# Patient Record
Sex: Male | Born: 1935 | Race: White | Hispanic: No | Marital: Single | Smoking: Former smoker
Health system: Southern US, Community
[De-identification: ages and names within clinical notes are randomized; demographics above are authoritative.]

## PROBLEM LIST (undated history)

## (undated) DIAGNOSIS — F329 Major depressive disorder, single episode, unspecified: Secondary | ICD-10-CM

## (undated) DIAGNOSIS — N400 Enlarged prostate without lower urinary tract symptoms: Secondary | ICD-10-CM

## (undated) DIAGNOSIS — F32A Depression, unspecified: Secondary | ICD-10-CM

## (undated) DIAGNOSIS — J45909 Unspecified asthma, uncomplicated: Secondary | ICD-10-CM

## (undated) DIAGNOSIS — K5730 Diverticulosis of large intestine without perforation or abscess without bleeding: Secondary | ICD-10-CM

## (undated) DIAGNOSIS — K219 Gastro-esophageal reflux disease without esophagitis: Secondary | ICD-10-CM

## (undated) DIAGNOSIS — E119 Type 2 diabetes mellitus without complications: Secondary | ICD-10-CM

## (undated) DIAGNOSIS — N200 Calculus of kidney: Secondary | ICD-10-CM

## (undated) DIAGNOSIS — J189 Pneumonia, unspecified organism: Secondary | ICD-10-CM

## (undated) DIAGNOSIS — F419 Anxiety disorder, unspecified: Secondary | ICD-10-CM

## (undated) DIAGNOSIS — I10 Essential (primary) hypertension: Secondary | ICD-10-CM

## (undated) DIAGNOSIS — K449 Diaphragmatic hernia without obstruction or gangrene: Secondary | ICD-10-CM

## (undated) DIAGNOSIS — I2510 Atherosclerotic heart disease of native coronary artery without angina pectoris: Secondary | ICD-10-CM

## (undated) DIAGNOSIS — J42 Unspecified chronic bronchitis: Secondary | ICD-10-CM

## (undated) DIAGNOSIS — J310 Chronic rhinitis: Secondary | ICD-10-CM

## (undated) DIAGNOSIS — E7800 Pure hypercholesterolemia, unspecified: Secondary | ICD-10-CM

## (undated) DIAGNOSIS — I509 Heart failure, unspecified: Secondary | ICD-10-CM

## (undated) HISTORY — PX: CYSTOSCOPY W/ LITHOLAPAXY / EHL: SUR377

## (undated) HISTORY — PX: COLONOSCOPY W/ BIOPSIES AND POLYPECTOMY: SHX1376

## (undated) HISTORY — PX: TRANSURETHRAL RESECTION OF PROSTATE: SHX73

## (undated) HISTORY — PX: CORONARY ANGIOPLASTY WITH STENT PLACEMENT: SHX49

## (undated) HISTORY — PX: PROSTATE SURGERY: SHX751

---
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
## 1960-02-26 HISTORY — PX: INGUINAL HERNIA REPAIR: SUR1180

## 1978-02-25 HISTORY — PX: INGUINAL HERNIA REPAIR: SUR1180

## 1997-08-31 ENCOUNTER — Ambulatory Visit (HOSPITAL_COMMUNITY): Admission: RE | Admit: 1997-08-31 | Discharge: 1997-08-31 | Payer: Self-pay | Admitting: *Deleted

## 2002-09-03 ENCOUNTER — Encounter (INDEPENDENT_AMBULATORY_CARE_PROVIDER_SITE_OTHER): Payer: Self-pay | Admitting: Specialist

## 2002-09-03 ENCOUNTER — Ambulatory Visit (HOSPITAL_COMMUNITY): Admission: RE | Admit: 2002-09-03 | Discharge: 2002-09-03 | Payer: Self-pay | Admitting: *Deleted

## 2004-01-03 ENCOUNTER — Ambulatory Visit: Payer: Self-pay | Admitting: Internal Medicine

## 2004-03-01 ENCOUNTER — Ambulatory Visit: Payer: Self-pay | Admitting: Internal Medicine

## 2004-03-05 ENCOUNTER — Ambulatory Visit: Payer: Self-pay | Admitting: Internal Medicine

## 2004-03-12 ENCOUNTER — Ambulatory Visit: Payer: Self-pay | Admitting: Internal Medicine

## 2004-03-15 ENCOUNTER — Emergency Department (HOSPITAL_COMMUNITY): Admission: EM | Admit: 2004-03-15 | Discharge: 2004-03-15 | Payer: Self-pay | Admitting: Emergency Medicine

## 2004-03-26 ENCOUNTER — Ambulatory Visit: Payer: Self-pay | Admitting: Internal Medicine

## 2004-04-03 ENCOUNTER — Ambulatory Visit: Payer: Self-pay | Admitting: Internal Medicine

## 2004-04-09 ENCOUNTER — Ambulatory Visit: Payer: Self-pay | Admitting: Internal Medicine

## 2004-04-16 ENCOUNTER — Ambulatory Visit: Payer: Self-pay | Admitting: Internal Medicine

## 2004-05-01 ENCOUNTER — Ambulatory Visit: Payer: Self-pay | Admitting: Internal Medicine

## 2004-05-08 ENCOUNTER — Ambulatory Visit: Payer: Self-pay | Admitting: Internal Medicine

## 2004-05-15 ENCOUNTER — Ambulatory Visit: Payer: Self-pay | Admitting: Internal Medicine

## 2004-06-04 ENCOUNTER — Ambulatory Visit: Payer: Self-pay | Admitting: Internal Medicine

## 2004-06-12 ENCOUNTER — Ambulatory Visit: Payer: Self-pay | Admitting: Internal Medicine

## 2004-06-18 ENCOUNTER — Ambulatory Visit: Payer: Self-pay | Admitting: Internal Medicine

## 2004-06-19 ENCOUNTER — Ambulatory Visit: Payer: Self-pay | Admitting: Internal Medicine

## 2004-06-25 ENCOUNTER — Ambulatory Visit: Payer: Self-pay | Admitting: Internal Medicine

## 2004-06-28 ENCOUNTER — Ambulatory Visit: Payer: Self-pay | Admitting: Internal Medicine

## 2004-07-03 ENCOUNTER — Ambulatory Visit: Payer: Self-pay | Admitting: Internal Medicine

## 2004-07-16 ENCOUNTER — Ambulatory Visit: Payer: Self-pay | Admitting: Internal Medicine

## 2004-07-17 ENCOUNTER — Ambulatory Visit: Payer: Self-pay | Admitting: Internal Medicine

## 2004-07-30 ENCOUNTER — Ambulatory Visit: Payer: Self-pay | Admitting: Internal Medicine

## 2004-08-07 ENCOUNTER — Ambulatory Visit: Payer: Self-pay | Admitting: Internal Medicine

## 2004-08-20 ENCOUNTER — Ambulatory Visit: Payer: Self-pay | Admitting: Internal Medicine

## 2004-08-29 ENCOUNTER — Ambulatory Visit: Payer: Self-pay | Admitting: Internal Medicine

## 2004-09-03 ENCOUNTER — Ambulatory Visit: Payer: Self-pay | Admitting: Internal Medicine

## 2004-09-04 ENCOUNTER — Ambulatory Visit: Payer: Self-pay | Admitting: Internal Medicine

## 2004-09-11 ENCOUNTER — Ambulatory Visit: Payer: Self-pay | Admitting: Internal Medicine

## 2004-09-17 ENCOUNTER — Ambulatory Visit: Payer: Self-pay | Admitting: Internal Medicine

## 2004-09-20 ENCOUNTER — Ambulatory Visit: Payer: Self-pay | Admitting: Internal Medicine

## 2004-09-24 ENCOUNTER — Ambulatory Visit: Payer: Self-pay | Admitting: Internal Medicine

## 2004-10-15 ENCOUNTER — Ambulatory Visit: Payer: Self-pay | Admitting: Internal Medicine

## 2004-10-17 ENCOUNTER — Ambulatory Visit (HOSPITAL_COMMUNITY): Admission: RE | Admit: 2004-10-17 | Discharge: 2004-10-17 | Payer: Self-pay | Admitting: *Deleted

## 2004-10-30 ENCOUNTER — Ambulatory Visit: Payer: Self-pay | Admitting: Internal Medicine

## 2004-11-06 ENCOUNTER — Ambulatory Visit: Payer: Self-pay | Admitting: Internal Medicine

## 2004-11-27 ENCOUNTER — Ambulatory Visit: Payer: Self-pay | Admitting: Internal Medicine

## 2004-12-03 ENCOUNTER — Ambulatory Visit: Payer: Self-pay | Admitting: Internal Medicine

## 2004-12-10 ENCOUNTER — Ambulatory Visit: Payer: Self-pay | Admitting: Internal Medicine

## 2004-12-12 ENCOUNTER — Ambulatory Visit: Payer: Self-pay | Admitting: Internal Medicine

## 2004-12-17 ENCOUNTER — Ambulatory Visit: Payer: Self-pay | Admitting: Internal Medicine

## 2004-12-25 ENCOUNTER — Ambulatory Visit: Payer: Self-pay | Admitting: Internal Medicine

## 2004-12-31 ENCOUNTER — Ambulatory Visit: Payer: Self-pay | Admitting: Internal Medicine

## 2005-01-08 ENCOUNTER — Ambulatory Visit: Payer: Self-pay | Admitting: Internal Medicine

## 2005-01-15 ENCOUNTER — Ambulatory Visit: Payer: Self-pay | Admitting: Internal Medicine

## 2005-01-22 ENCOUNTER — Ambulatory Visit: Payer: Self-pay | Admitting: Internal Medicine

## 2005-01-28 ENCOUNTER — Ambulatory Visit: Payer: Self-pay | Admitting: Internal Medicine

## 2005-02-05 ENCOUNTER — Ambulatory Visit: Payer: Self-pay | Admitting: Internal Medicine

## 2005-02-11 ENCOUNTER — Ambulatory Visit: Payer: Self-pay | Admitting: Internal Medicine

## 2005-02-27 ENCOUNTER — Ambulatory Visit: Payer: Self-pay | Admitting: Internal Medicine

## 2005-02-28 ENCOUNTER — Ambulatory Visit: Payer: Self-pay | Admitting: Internal Medicine

## 2005-03-26 ENCOUNTER — Ambulatory Visit: Payer: Self-pay | Admitting: Internal Medicine

## 2005-04-02 ENCOUNTER — Ambulatory Visit: Payer: Self-pay | Admitting: Internal Medicine

## 2005-04-09 ENCOUNTER — Ambulatory Visit: Payer: Self-pay | Admitting: Internal Medicine

## 2005-04-19 ENCOUNTER — Emergency Department (HOSPITAL_COMMUNITY): Admission: EM | Admit: 2005-04-19 | Discharge: 2005-04-19 | Payer: Self-pay | Admitting: Emergency Medicine

## 2005-04-22 ENCOUNTER — Ambulatory Visit: Payer: Self-pay

## 2006-01-11 ENCOUNTER — Emergency Department (HOSPITAL_COMMUNITY): Admission: EM | Admit: 2006-01-11 | Discharge: 2006-01-11 | Payer: Self-pay | Admitting: Emergency Medicine

## 2006-01-31 ENCOUNTER — Encounter: Admission: RE | Admit: 2006-01-31 | Discharge: 2006-01-31 | Payer: Self-pay | Admitting: *Deleted

## 2006-02-11 ENCOUNTER — Encounter: Admission: RE | Admit: 2006-02-11 | Discharge: 2006-02-11 | Payer: Self-pay | Admitting: *Deleted

## 2006-03-25 ENCOUNTER — Ambulatory Visit: Payer: Self-pay | Admitting: Internal Medicine

## 2006-03-26 ENCOUNTER — Ambulatory Visit: Payer: Self-pay | Admitting: Internal Medicine

## 2006-03-27 ENCOUNTER — Ambulatory Visit: Payer: Self-pay | Admitting: Internal Medicine

## 2006-04-01 ENCOUNTER — Ambulatory Visit: Payer: Self-pay | Admitting: Internal Medicine

## 2006-04-02 ENCOUNTER — Ambulatory Visit: Payer: Self-pay | Admitting: Internal Medicine

## 2006-04-04 ENCOUNTER — Ambulatory Visit: Payer: Self-pay | Admitting: Internal Medicine

## 2006-04-10 ENCOUNTER — Ambulatory Visit: Payer: Self-pay | Admitting: Internal Medicine

## 2006-04-14 ENCOUNTER — Ambulatory Visit: Payer: Self-pay | Admitting: Internal Medicine

## 2006-04-17 ENCOUNTER — Ambulatory Visit: Payer: Self-pay | Admitting: Internal Medicine

## 2006-04-22 ENCOUNTER — Ambulatory Visit: Payer: Self-pay | Admitting: Internal Medicine

## 2006-04-23 ENCOUNTER — Ambulatory Visit: Payer: Self-pay | Admitting: Internal Medicine

## 2006-04-25 ENCOUNTER — Ambulatory Visit: Payer: Self-pay | Admitting: Internal Medicine

## 2006-04-25 LAB — CONVERTED CEMR LAB
Cholesterol: 230 mg/dL (ref 0–200)
GFR calc Af Amer: 121 mL/min
GFR calc non Af Amer: 100 mL/min
Glucose, Bld: 107 mg/dL — ABNORMAL HIGH (ref 70–99)
HDL: 25.5 mg/dL — ABNORMAL LOW (ref >39.0)
Sodium: 136 meq/L (ref 135–145)
Total CHOL/HDL Ratio: 9
Triglycerides: 128 mg/dL (ref 0–149)

## 2006-04-29 ENCOUNTER — Ambulatory Visit: Payer: Self-pay | Admitting: Internal Medicine

## 2006-05-02 ENCOUNTER — Ambulatory Visit: Payer: Self-pay | Admitting: Internal Medicine

## 2006-05-06 ENCOUNTER — Ambulatory Visit: Payer: Self-pay | Admitting: Internal Medicine

## 2006-05-09 ENCOUNTER — Ambulatory Visit: Payer: Self-pay | Admitting: Internal Medicine

## 2006-05-12 ENCOUNTER — Ambulatory Visit: Payer: Self-pay | Admitting: Internal Medicine

## 2006-05-15 ENCOUNTER — Ambulatory Visit: Payer: Self-pay | Admitting: Internal Medicine

## 2006-05-20 ENCOUNTER — Ambulatory Visit: Payer: Self-pay | Admitting: Internal Medicine

## 2006-05-21 ENCOUNTER — Ambulatory Visit: Payer: Self-pay | Admitting: Internal Medicine

## 2006-05-23 ENCOUNTER — Ambulatory Visit: Payer: Self-pay | Admitting: Internal Medicine

## 2006-05-26 ENCOUNTER — Ambulatory Visit: Payer: Self-pay | Admitting: Internal Medicine

## 2006-05-27 ENCOUNTER — Ambulatory Visit: Payer: Self-pay | Admitting: Internal Medicine

## 2006-05-29 ENCOUNTER — Ambulatory Visit: Payer: Self-pay | Admitting: Internal Medicine

## 2006-05-29 LAB — CONVERTED CEMR LAB
ALT: 41 units/L — ABNORMAL HIGH (ref 0–40)
Triglycerides: 142 mg/dL (ref 0–149)
VLDL: 28 mg/dL (ref 0–40)

## 2006-06-02 ENCOUNTER — Ambulatory Visit: Payer: Self-pay | Admitting: Internal Medicine

## 2006-06-03 ENCOUNTER — Ambulatory Visit: Payer: Self-pay | Admitting: Internal Medicine

## 2006-06-06 ENCOUNTER — Ambulatory Visit: Payer: Self-pay | Admitting: Internal Medicine

## 2006-06-09 ENCOUNTER — Ambulatory Visit: Payer: Self-pay | Admitting: Internal Medicine

## 2006-06-10 ENCOUNTER — Ambulatory Visit: Payer: Self-pay | Admitting: Internal Medicine

## 2006-06-13 ENCOUNTER — Ambulatory Visit: Payer: Self-pay | Admitting: Internal Medicine

## 2006-06-17 ENCOUNTER — Ambulatory Visit: Payer: Self-pay | Admitting: Internal Medicine

## 2006-06-20 ENCOUNTER — Ambulatory Visit: Payer: Self-pay | Admitting: Internal Medicine

## 2006-06-26 ENCOUNTER — Ambulatory Visit: Payer: Self-pay | Admitting: Internal Medicine

## 2006-06-27 ENCOUNTER — Ambulatory Visit: Payer: Self-pay | Admitting: Internal Medicine

## 2006-06-30 ENCOUNTER — Ambulatory Visit: Payer: Self-pay | Admitting: Internal Medicine

## 2006-07-03 ENCOUNTER — Ambulatory Visit: Payer: Self-pay | Admitting: Internal Medicine

## 2006-07-07 ENCOUNTER — Ambulatory Visit: Payer: Self-pay | Admitting: Internal Medicine

## 2006-07-11 ENCOUNTER — Ambulatory Visit: Payer: Self-pay | Admitting: Internal Medicine

## 2006-07-15 ENCOUNTER — Ambulatory Visit: Payer: Self-pay | Admitting: Internal Medicine

## 2006-07-22 ENCOUNTER — Ambulatory Visit: Payer: Self-pay | Admitting: Internal Medicine

## 2006-07-29 ENCOUNTER — Ambulatory Visit: Payer: Self-pay | Admitting: Internal Medicine

## 2006-08-05 ENCOUNTER — Ambulatory Visit: Payer: Self-pay | Admitting: Internal Medicine

## 2006-08-13 ENCOUNTER — Ambulatory Visit: Payer: Self-pay | Admitting: Internal Medicine

## 2006-08-20 ENCOUNTER — Ambulatory Visit: Payer: Self-pay | Admitting: Internal Medicine

## 2006-08-27 ENCOUNTER — Ambulatory Visit: Payer: Self-pay | Admitting: Internal Medicine

## 2006-09-02 ENCOUNTER — Ambulatory Visit: Payer: Self-pay | Admitting: Internal Medicine

## 2006-09-09 ENCOUNTER — Ambulatory Visit: Payer: Self-pay | Admitting: Internal Medicine

## 2006-09-16 ENCOUNTER — Ambulatory Visit: Payer: Self-pay | Admitting: Internal Medicine

## 2006-09-17 ENCOUNTER — Ambulatory Visit: Payer: Self-pay | Admitting: Internal Medicine

## 2006-09-19 ENCOUNTER — Ambulatory Visit: Payer: Self-pay | Admitting: Cardiology

## 2006-09-24 ENCOUNTER — Ambulatory Visit: Payer: Self-pay | Admitting: Internal Medicine

## 2006-09-30 ENCOUNTER — Ambulatory Visit: Payer: Self-pay | Admitting: Internal Medicine

## 2006-10-06 ENCOUNTER — Ambulatory Visit: Payer: Self-pay | Admitting: Internal Medicine

## 2006-10-08 ENCOUNTER — Ambulatory Visit: Payer: Self-pay | Admitting: Internal Medicine

## 2006-10-10 ENCOUNTER — Ambulatory Visit: Payer: Self-pay | Admitting: Internal Medicine

## 2006-10-13 ENCOUNTER — Ambulatory Visit: Payer: Self-pay | Admitting: Internal Medicine

## 2006-10-13 LAB — CONVERTED CEMR LAB
ALT: 51 units/L (ref 0–53)
AST: 34 units/L (ref 0–37)
Albumin: 4.6 g/dL (ref 3.5–5.2)
Alkaline Phosphatase: 56 units/L (ref 39–117)
BUN: 9 mg/dL (ref 6–23)
Bacteria, UA: NEGATIVE
Basophils Absolute: 0.1 10*3/uL (ref 0.0–0.1)
Calcium: 9.7 mg/dL (ref 8.4–10.5)
Chloride: 104 meq/L (ref 96–112)
GFR calc Af Amer: 106 mL/min
GFR calc non Af Amer: 87 mL/min
HCT: 47.2 % (ref 39.0–52.0)
Hemoglobin, Urine: NEGATIVE
MCHC: 34.6 g/dL (ref 30.0–36.0)
Neutrophils Relative %: 65.4 % (ref 43.0–77.0)
RBC: 5.34 M/uL (ref 4.22–5.81)
RDW: 13.5 % (ref 11.5–14.6)
Specific Gravity, Urine: 1.02 (ref 1.000–1.03)
Squamous Epithelial / LPF: NEGATIVE /lpf
Total Protein, Urine: NEGATIVE mg/dL
WBC: 6.1 10*3/uL (ref 4.5–10.5)
pH: 5.5 (ref 5.0–8.0)

## 2006-10-14 ENCOUNTER — Ambulatory Visit: Payer: Self-pay | Admitting: Internal Medicine

## 2006-10-14 LAB — CONVERTED CEMR LAB
Cholesterol: 174 mg/dL (ref 0–200)
HDL: 19.4 mg/dL — ABNORMAL LOW (ref >39.0)
LDL Cholesterol: 135 mg/dL — ABNORMAL HIGH (ref 0–99)
Triglycerides: 98 mg/dL (ref 0–149)
VLDL: 20 mg/dL (ref 0–40)

## 2006-10-16 ENCOUNTER — Ambulatory Visit: Payer: Self-pay | Admitting: Internal Medicine

## 2006-10-20 ENCOUNTER — Ambulatory Visit: Payer: Self-pay | Admitting: Internal Medicine

## 2006-10-22 ENCOUNTER — Ambulatory Visit: Payer: Self-pay | Admitting: Internal Medicine

## 2006-10-28 ENCOUNTER — Ambulatory Visit: Payer: Self-pay | Admitting: Internal Medicine

## 2006-10-29 ENCOUNTER — Ambulatory Visit: Payer: Self-pay | Admitting: Internal Medicine

## 2006-11-03 ENCOUNTER — Ambulatory Visit: Payer: Self-pay | Admitting: Internal Medicine

## 2006-11-05 ENCOUNTER — Ambulatory Visit: Payer: Self-pay | Admitting: Internal Medicine

## 2006-11-09 ENCOUNTER — Emergency Department (HOSPITAL_COMMUNITY): Admission: EM | Admit: 2006-11-09 | Discharge: 2006-11-09 | Payer: Self-pay | Admitting: Emergency Medicine

## 2006-11-12 ENCOUNTER — Ambulatory Visit: Payer: Self-pay | Admitting: Internal Medicine

## 2006-11-24 ENCOUNTER — Encounter: Payer: Self-pay | Admitting: *Deleted

## 2006-11-24 DIAGNOSIS — K219 Gastro-esophageal reflux disease without esophagitis: Secondary | ICD-10-CM

## 2006-11-24 DIAGNOSIS — K5730 Diverticulosis of large intestine without perforation or abscess without bleeding: Secondary | ICD-10-CM | POA: Insufficient documentation

## 2006-11-24 DIAGNOSIS — D126 Benign neoplasm of colon, unspecified: Secondary | ICD-10-CM | POA: Insufficient documentation

## 2006-11-24 DIAGNOSIS — E119 Type 2 diabetes mellitus without complications: Secondary | ICD-10-CM | POA: Insufficient documentation

## 2006-11-24 DIAGNOSIS — N200 Calculus of kidney: Secondary | ICD-10-CM | POA: Insufficient documentation

## 2006-11-24 DIAGNOSIS — Z87898 Personal history of other specified conditions: Secondary | ICD-10-CM | POA: Insufficient documentation

## 2006-11-24 DIAGNOSIS — I10 Essential (primary) hypertension: Secondary | ICD-10-CM | POA: Insufficient documentation

## 2006-11-24 DIAGNOSIS — E785 Hyperlipidemia, unspecified: Secondary | ICD-10-CM | POA: Insufficient documentation

## 2006-11-24 HISTORY — DX: Gastro-esophageal reflux disease without esophagitis: K21.9

## 2006-12-01 ENCOUNTER — Ambulatory Visit: Payer: Self-pay | Admitting: Internal Medicine

## 2006-12-09 ENCOUNTER — Ambulatory Visit (HOSPITAL_COMMUNITY): Admission: RE | Admit: 2006-12-09 | Discharge: 2006-12-09 | Payer: Self-pay | Admitting: *Deleted

## 2006-12-09 DIAGNOSIS — J3089 Other allergic rhinitis: Secondary | ICD-10-CM | POA: Insufficient documentation

## 2006-12-09 DIAGNOSIS — J45909 Unspecified asthma, uncomplicated: Secondary | ICD-10-CM | POA: Insufficient documentation

## 2006-12-10 ENCOUNTER — Ambulatory Visit: Payer: Self-pay | Admitting: Internal Medicine

## 2006-12-10 ENCOUNTER — Encounter: Payer: Self-pay | Admitting: Internal Medicine

## 2006-12-10 DIAGNOSIS — E7439 Other disorders of intestinal carbohydrate absorption: Secondary | ICD-10-CM | POA: Insufficient documentation

## 2006-12-10 DIAGNOSIS — Z8639 Personal history of other endocrine, nutritional and metabolic disease: Secondary | ICD-10-CM

## 2006-12-10 DIAGNOSIS — H9319 Tinnitus, unspecified ear: Secondary | ICD-10-CM | POA: Insufficient documentation

## 2006-12-10 DIAGNOSIS — N400 Enlarged prostate without lower urinary tract symptoms: Secondary | ICD-10-CM | POA: Insufficient documentation

## 2006-12-10 DIAGNOSIS — R634 Abnormal weight loss: Secondary | ICD-10-CM | POA: Insufficient documentation

## 2006-12-10 DIAGNOSIS — F411 Generalized anxiety disorder: Secondary | ICD-10-CM | POA: Insufficient documentation

## 2006-12-10 DIAGNOSIS — Z862 Personal history of diseases of the blood and blood-forming organs and certain disorders involving the immune mechanism: Secondary | ICD-10-CM | POA: Insufficient documentation

## 2006-12-10 DIAGNOSIS — Z87442 Personal history of urinary calculi: Secondary | ICD-10-CM | POA: Insufficient documentation

## 2006-12-10 DIAGNOSIS — J309 Allergic rhinitis, unspecified: Secondary | ICD-10-CM | POA: Insufficient documentation

## 2006-12-10 DIAGNOSIS — F329 Major depressive disorder, single episode, unspecified: Secondary | ICD-10-CM

## 2006-12-10 DIAGNOSIS — F3289 Other specified depressive episodes: Secondary | ICD-10-CM | POA: Insufficient documentation

## 2006-12-12 ENCOUNTER — Telehealth (INDEPENDENT_AMBULATORY_CARE_PROVIDER_SITE_OTHER): Payer: Self-pay | Admitting: *Deleted

## 2006-12-16 ENCOUNTER — Ambulatory Visit: Payer: Self-pay | Admitting: Internal Medicine

## 2006-12-19 ENCOUNTER — Encounter: Payer: Self-pay | Admitting: Internal Medicine

## 2006-12-19 ENCOUNTER — Ambulatory Visit: Payer: Self-pay | Admitting: Internal Medicine

## 2006-12-19 DIAGNOSIS — F519 Sleep disorder not due to a substance or known physiological condition, unspecified: Secondary | ICD-10-CM | POA: Insufficient documentation

## 2006-12-23 ENCOUNTER — Encounter: Payer: Self-pay | Admitting: Internal Medicine

## 2006-12-23 LAB — CONVERTED CEMR LAB
BUN: 4 mg/dL — ABNORMAL LOW (ref 6–23)
Creatinine, Ser: 0.8 mg/dL (ref 0.4–1.5)

## 2006-12-24 ENCOUNTER — Ambulatory Visit: Payer: Self-pay | Admitting: Internal Medicine

## 2006-12-25 ENCOUNTER — Emergency Department (HOSPITAL_COMMUNITY): Admission: EM | Admit: 2006-12-25 | Discharge: 2006-12-25 | Payer: Self-pay | Admitting: Emergency Medicine

## 2006-12-27 ENCOUNTER — Emergency Department (HOSPITAL_COMMUNITY): Admission: EM | Admit: 2006-12-27 | Discharge: 2006-12-27 | Payer: Self-pay | Admitting: Emergency Medicine

## 2007-01-02 ENCOUNTER — Emergency Department (HOSPITAL_COMMUNITY): Admission: EM | Admit: 2007-01-02 | Discharge: 2007-01-02 | Payer: Self-pay | Admitting: Emergency Medicine

## 2007-01-06 ENCOUNTER — Ambulatory Visit: Payer: Self-pay | Admitting: Internal Medicine

## 2007-01-08 ENCOUNTER — Ambulatory Visit: Payer: Self-pay | Admitting: Internal Medicine

## 2007-01-08 DIAGNOSIS — J209 Acute bronchitis, unspecified: Secondary | ICD-10-CM | POA: Insufficient documentation

## 2007-01-08 DIAGNOSIS — H1010 Acute atopic conjunctivitis, unspecified eye: Secondary | ICD-10-CM | POA: Insufficient documentation

## 2007-01-12 ENCOUNTER — Ambulatory Visit: Payer: Self-pay | Admitting: Internal Medicine

## 2007-01-12 ENCOUNTER — Telehealth (INDEPENDENT_AMBULATORY_CARE_PROVIDER_SITE_OTHER): Payer: Self-pay | Admitting: *Deleted

## 2007-01-14 ENCOUNTER — Ambulatory Visit: Payer: Self-pay | Admitting: Internal Medicine

## 2007-01-19 ENCOUNTER — Ambulatory Visit: Payer: Self-pay | Admitting: Internal Medicine

## 2007-01-26 ENCOUNTER — Ambulatory Visit: Payer: Self-pay | Admitting: Internal Medicine

## 2007-02-04 ENCOUNTER — Ambulatory Visit: Payer: Self-pay | Admitting: Internal Medicine

## 2007-03-06 ENCOUNTER — Ambulatory Visit: Payer: Self-pay | Admitting: Internal Medicine

## 2007-03-09 ENCOUNTER — Inpatient Hospital Stay (HOSPITAL_COMMUNITY): Admission: RE | Admit: 2007-03-09 | Discharge: 2007-03-10 | Payer: Self-pay | Admitting: Psychiatry

## 2007-03-09 ENCOUNTER — Emergency Department (HOSPITAL_COMMUNITY): Admission: EM | Admit: 2007-03-09 | Discharge: 2007-03-09 | Payer: Self-pay | Admitting: Emergency Medicine

## 2007-03-09 ENCOUNTER — Ambulatory Visit: Payer: Self-pay | Admitting: Psychiatry

## 2007-03-17 ENCOUNTER — Encounter: Payer: Self-pay | Admitting: Internal Medicine

## 2007-03-19 ENCOUNTER — Encounter: Admission: RE | Admit: 2007-03-19 | Discharge: 2007-03-19 | Payer: Self-pay | Admitting: *Deleted

## 2007-03-20 ENCOUNTER — Ambulatory Visit: Payer: Self-pay | Admitting: Internal Medicine

## 2007-03-27 ENCOUNTER — Ambulatory Visit (HOSPITAL_COMMUNITY): Admission: RE | Admit: 2007-03-27 | Discharge: 2007-03-27 | Payer: Self-pay | Admitting: *Deleted

## 2007-04-21 ENCOUNTER — Encounter: Payer: Self-pay | Admitting: Internal Medicine

## 2007-05-14 ENCOUNTER — Ambulatory Visit: Payer: Self-pay | Admitting: Internal Medicine

## 2007-06-11 ENCOUNTER — Ambulatory Visit: Payer: Self-pay | Admitting: Internal Medicine

## 2007-06-18 ENCOUNTER — Ambulatory Visit: Payer: Self-pay | Admitting: Internal Medicine

## 2007-06-23 ENCOUNTER — Ambulatory Visit: Payer: Self-pay | Admitting: Internal Medicine

## 2007-06-25 ENCOUNTER — Ambulatory Visit: Payer: Self-pay | Admitting: Internal Medicine

## 2007-07-02 ENCOUNTER — Ambulatory Visit: Payer: Self-pay | Admitting: Internal Medicine

## 2007-07-09 ENCOUNTER — Ambulatory Visit: Payer: Self-pay | Admitting: Internal Medicine

## 2007-07-17 ENCOUNTER — Ambulatory Visit: Payer: Self-pay | Admitting: Internal Medicine

## 2007-07-23 ENCOUNTER — Ambulatory Visit: Payer: Self-pay | Admitting: Internal Medicine

## 2007-07-30 ENCOUNTER — Ambulatory Visit: Payer: Self-pay | Admitting: Internal Medicine

## 2007-08-20 ENCOUNTER — Ambulatory Visit: Payer: Self-pay | Admitting: Internal Medicine

## 2007-09-03 ENCOUNTER — Ambulatory Visit: Payer: Self-pay | Admitting: Internal Medicine

## 2007-10-01 ENCOUNTER — Ambulatory Visit: Payer: Self-pay | Admitting: Internal Medicine

## 2010-03-18 ENCOUNTER — Encounter: Payer: Self-pay | Admitting: Internal Medicine

## 2010-03-27 NOTE — Miscellaneous (Signed)
Summary: Injection Record / [facility]    Injection Record / [facility]    Imported By: Lennie Odor 07/18/2009 16:00:49  _____________________________________________________________________  External Attachment:    Type:   Image     Comment:   External Document

## 2010-07-10 NOTE — Assessment & Plan Note (Signed)
[name]                             PULMONARY OFFICE NOTE   NAME:[name], [name]                       MRN:          [id number]  DATE:09/17/2006                            DOB:          [birth year]    PROBLEM:  1. Asthma.  2. Allergic rhinitis.  3. Esophageal reflux.   PRIMARY PHYSICIAN:  Rosalyn Gess. Norins, MD   HISTORY:  Bothered by tinnitus, which did not respond to a trial Valtrex  and prednisone.  He saw Dr. Suzanna Obey and is pending a CT for  evaluation.  Diltiazem was added for blood pressure.  He quite Nasacort,  not seeing that it helped, but he was using it only very intermittently,  and was discussed this.  He continues allergy vaccine here with no  problems.  Astelin was stopped, because of over-drying.  He has not been  wheezing.  There has been no sputum, no chest pain.   MEDICATION LIST:  Reviewed without changes.  He tends to be intermittent  with all of his maintenance therapies including Advair, Astelin, and  Nasacort.  He continues Altace but denies bothersome cough or urticaria.   OBJECTIVE:  VITAL SIGNS:  Weight 214 pounds, BP 122/68, pulse 79, room  air saturation 96%.  HEENT:  There is mild nasal congestion, dry turbinate edema bilaterally.  No visible polyps, no postnasal drainage.  He has a long palate.  He is  alone and not being told that he snores or stops breathing.  He does not  seem to notice significant day-time sleepiness.  CHEST:  Clear.  Breathing is unlabored.  Speech quality is unremarkable.  There is no stridor.  HEART:  Sounds are regular without murmurs.  EXTREMITIES:  Without edema.   IMPRESSION:  Asthma control seems adequate.  Allergic rhinitis probably  includes a component of irritant rhinitis.  He does not seem bothered  enough to be willing to give a sustained trial to any nasal spray, and  we discussed options.  Esophageal reflux has not recently been a major  complaint.  Tinnitus is being  evaluated by Dr. Jearld Fenton.   PLAN:  Sample Veramyst.  Encouraging him to use the sample up, two  sprays each night at bedtime, paying attention to whether it helps by  the time he has finished it.  He will continue his allergy vaccine and  schedule return in 4 months, earlier p.r.n.     [name] D. Maple Hudson, MD, [name] [name], FACP  Electronically Signed   CDY/MedQ  DD: 09/20/2006  DT: 09/21/2006  Job #: [id number]

## 2010-07-10 NOTE — Op Note (Signed)
NAMEALGERNON, MUNDIE NO.:  [id number]   MEDICAL RECORD NO.:  [id number]          PATIENT TYPE:  AMB   LOCATION:  ENDO                         FACILITY:  [facility]   PHYSICIAN:  Georgiana Spinner, M.D.    DATE OF BIRTH:  [birth year]   DATE OF PROCEDURE:  DATE OF DISCHARGE:                               OPERATIVE REPORT   PROCEDURE:  Colonoscopy.   INDICATIONS:  Weight loss.   ANESTHESIA:  Fentanyl 60 mcg, Versed 6 mg.   PROCEDURE:  With the patient mildly sedated in the left lateral  decubitus position, a rectal exam was performed which was unremarkable.  Subsequently the Pentax videoscopic colonoscope was inserted in the  rectum and passed under direct vision to the cecum identified by the  ileocecal valve and appendiceal orifice at the base of the cecum, both  of which were photographed. From this point, the colonoscope was slowly  withdrawn taking circumferential views of the colonic mucosa stopping  only to photograph diverticula seen along the way until we reached the  rectum which appeared normal on direct and showed hemorrhoids on  retroflexed view. The endoscope was straightened and withdrawn.  The  patient's vital signs and pulse oximeter remained stable.  The patient  tolerated the procedure well without apparent complications.   FINDINGS:  Internal hemorrhoids, moderate diverticulosis of sigmoid  colon otherwise an unremarkable exam.   PLAN:  Consider repeat examination in 5 or 10 years           ______________________________  Georgiana Spinner, M.D.     GMO/MEDQ  D:  03/27/2007  T:  03/27/2007  Job:  [id number]

## 2010-07-10 NOTE — Discharge Summary (Signed)
NAMEDODD, SCHMID NO.:  [id number]   MEDICAL RECORD NO.:  [id number]          PATIENT TYPE:  IPS   LOCATION:  0504                          [facility]ACILITY:  [facility]   PHYSICIAN:  Jasmine Pang, M.D. DATE OF BIRTH:  1928-07-24   DATE OF ADMISSION:  03/09/2007  DATE OF DISCHARGE:  03/10/2007                               DISCHARGE SUMMARY   IDENTIFYING INFORMATION:  This is a 77 year old single white male.  This  is a voluntary admission.   HISTORY OF PRESENT ILLNESS:  This patient was on the [facility]al [facility]  Unit for a brief observational stay after he was referred by his  urologist.  Apparently, he presented at the [facility] for a routine  check and, at that time, expressed to them that he was having a lot of  problems with depressed mood.  Indicated that he had had some thoughts  of ending his life, possibly by sitting in a parked car with the engine  running.  His physician became concerned and referred him for admission.  The patient today admits that he has had some fleeting thoughts about  suicide.  Denies that he has true suicidal intent.  He denies any  history of prior attempts.  He reports that he has been increasingly  depressed since May of this year because of increasing and varied  physical problems which included some tinnitus that he developed in May  2008 followed by some skin infections and then some prostate trouble.  He had also been socially quite active with a friend of his that he  would go out with several times a week.  His friend became ill at the  beginning of December and he has had little social contact since that  time.  He did spend Christmas with a nephew at his home here in  [place].  The patient is denying that he has any suicidal intent  today.  Has been referred to Dr. Wynonia Lawman by his primary care physician  and would like to pursue outpatient treatment.   PAST PSYCHIATRIC HISTORY:  This is the patient's first inpatient  psychiatric admission.  He denies any history of learning disability or  abuse in childhood.  Denies any substance abuse.  He has taken Celexa in  the past for depression, which has caused him to have quite a bit of  sedation.  He has also taken amitriptyline at one point in the past  which is to help with sleep, which he is not currently taking.  Denies  any history of prior suicide attempts.  No history of substance abuse.  Currently has an appointment next week with Dr. Wynonia Lawman on January 26th  at 10:00 a.m.   SOCIAL HISTORY:  A single white male, never married, no children.  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  He retired  from there in 2001.  He has two sisters which are deceased, one sister  in 41 and the other sister and 2006.  He also has one sister living in  [place] with whom he maintains communication.  Graduated  from college at  [employer] with a baccalaureate degree in business.  Mother  deceased from a stroke.  Currently living alone and does maintain  contact with his nephew who lives here in town.   FAMILY HISTORY:  He denies any family history of depression or substance  abuse.   MEDICAL HISTORY:  The patient is followed by Dr. Illene Regulus, his  primary care physician.   MEDICAL PROBLEMS:  Are hypertension and cholesterol.  He has a history  of tinnitus and complains of some residual buzzing in his ear but he  feels he can cope well with this.   CURRENT MEDICATIONS:  1. Diovan 160 daily for his hypertension.  2. also takes Claritin 10 mg p.r.n.  3. And a vitamin daily.   DRUG ALLERGIES:  None.  He did stop Pravachol in the past due to  myalgias and says that Lipitor increased his liver enzymes.   POSITIVE PHYSICAL FINDINGS:  The patient was medically cleared in the  emergency room where his physical exam was done.  Is noted in the  record.  Today, his review of systems is essentially negative.  He has  no somatic complaints.  On  admission to our unit, he is a well-  nourished, well-developed, healthy-appearing gentleman for his age.  Vital signs are within normal limits and no somatic complaints.   DIAGNOSTIC STUDIES:  CBC:  WBC 3.6, hemoglobin 14.8, hematocrit 42.1 and  platelets 190,000.  Chemistry:  Sodium 139, potassium 4.1, chloride 103,  carbon dioxide 32, BUN 9, creatinine 0.84, and random glucose was 96.  Liver enzymes are within normal limits and his urine drug screen is  negative for all substances.  Routine urinalysis is unremarkable.   MENTAL STATUS EXAM:  A fully alert gentleman, dressed appropriately,  pleasant, sociable, mildly anxious today.  Says that he is very  concerned about how he got here.  His chief complaint is: I must have  said something stupid.  Denies that he has any suicidal intent, is  anxious to get home to his personal duties.  Has some things he wants to  get done at home.  Endorses that he has been a little bit more depressed  and more reclusive lately since he is unable to get out with his friend.  No prior suicide attempts.  Promising safety at home.  Speech is normal  in pace, tone and production.  Affect is appropriate and full range.  Mood is neutral.  Thought process is logical and coherent.  No flight of  ideas, no paranoia.  No internal preoccupations. No homicidal thoughts.  No suicidal thoughts.  Cognition is fully preserved.   DISCHARGE DIAGNOSIS:  AXIS I:  Depressive disorder, not otherwise  specified.  AXIS II:  No diagnosis.  AXIS III:  Hypertension.  AXIS IV:  Moderate issues with some social isolation.  AXIS V:  Current 62, past year 92.   PLAN:  Is to discharge the patient today.  We have not started any new  medications here and have given him his Diovan 160 mg this morning.  We  did discuss with him the possibility of attending are intensive  outpatient program downstairs but he would prefer to just follow up with  Dr. Wynonia Lawman next week and we are in  agreement with this plan.   DISCHARGE MEDICATIONS:  Diovan 160 p.o. daily as prescribed by Dr.  Debby Bud.      Margaret A. Scott, N.P.  Jasmine Pang, M.D.  Electronically Signed    MAS/MEDQ  D:  03/11/2007  T:  03/11/2007  Job:  [id number]

## 2010-07-10 NOTE — Assessment & Plan Note (Signed)
[facility]                             PULMONARY OFFICE NOTE   NAME:[name] [name], [name] [name]                       MRN:          [id number]  DATE:10/20/2006                            DOB:          November 11, 1928    PROBLEM:  1. Asthma.  2. Allergic rhinitis.  3. Esophageal reflux.   HISTORY:  He thinks Veramyst burned his nose after using it only a few  times.  Tip of tongue still burns.  Complains of tinnitus and worries  that this may be due to food intolerance.  We discussed food allergy,  food intolerance, and likelihood that the tinnitus complaint is  unrelated.  Somebody he knows told him that wax could cause tinnitus, so  he is very eager to have me check that.  He has continued allergy  vaccine at 1:50 with no purulent or bloody discharge.  No cough or  wheeze.   MEDICATIONS:  1. Multivitamins.  2. Diovan 160 mg.  3. Red yeast rice.  4. Os-Cal.  5. Citalopram 10 mg.  6. He continues allergy vaccine.  7. Uses at intervals, when needed, Advair 100/50.  8. Albuterol rescue inhaler.   No medication allergy.   OBJECTIVE:  Weight 204 pounds, BP 152/84, pulse 94, room air saturation  96%.  Oral mucosa is unremarkable except the tip of his tongue does look a bit  smooth.  There is no cerumen.  Ears look okay.  No nystagmus.  No  adenopathy.  No carotid bruits.  HEART:  Sounds are regular without murmur.  CHEST:  Clear.   IMPRESSION:  1. Asthma control seems good.  2. Allergic rhinitis, control currently is adequate without nasal      steroid.  3. Suspect some esophageal reflux.  4. Tinnitus.   PLAN:  1. Nasal saline spray.  2. Food RAST panel.  3. Sudafed PE if needed as a decongestant.  4. We are going to advance his vaccine to 1:10 as discussed,      scheduling return in 6 months, earlier p.r.n.     Clinton D. Maple Hudson, MD, Tonny Bollman, FACP  Electronically Signed    CDY/MedQ  DD: 10/20/2006  DT: 10/21/2006  Job #: [id number]

## 2010-07-10 NOTE — Op Note (Signed)
[name] [name], [name] [name]                ACCOUNT NO.:  [id number]   MEDICAL RECORD NO.:  [id number]          PATIENT TYPE:  AMB   LOCATION:  ENDO                         FACILITY:  [facility]   PHYSICIAN:  Georgiana Spinner, M.D.    DATE OF BIRTH:  1928-08-06   DATE OF PROCEDURE:  12/09/2006  DATE OF DISCHARGE:                               OPERATIVE REPORT   PROCEDURE:  Upper endoscopy.   INDICATIONS:  Weight loss.   ANESTHESIA:  Fentanyl 75 mcg, Versed 7.5 mg.   DESCRIPTION OF PROCEDURE:  With the patient mildly sedated in the left  lateral decubitus position, the Pentax videoscopic endoscope was  inserted into the mouth, passed under direct vision through the  esophagus which appeared normal.  We advanced into the stomach fundus,  body, antrum, duodenal bulb, and second portion of duodenum all appeared  normal.   From this point, the endoscope was slowly withdrawn taking  circumferential views of the duodenal mucosa until the endoscope had  been pulled back, and the stomach placed in retroflexion to view the  stomach from below.  The endoscope was straightened and withdrawn taking  circumferential views of the remaining gastric and esophageal mucosa.  The patient's vital signs, and pulse oximeter remained stable.  The  patient tolerated the procedure well without apparent complications.   FINDINGS:  Unremarkable examination except there was some blood noted in  the stomach, the etiology of which was not clear.   PLAN:  We will have patient follow up with me as an outpatient.           ______________________________  Georgiana Spinner, M.D.     GMO/MEDQ  D:  12/09/2006  T:  12/09/2006  Job:  [id number]   cc:   Rosalyn Gess. Norins, MD  [address]  [place]  [place] [address]

## 2010-07-10 NOTE — Assessment & Plan Note (Signed)
[facility]                             PULMONARY OFFICE NOTE   NAME:[name] [name], [name] [name]                       MRN:          [id number]  DATE:01/19/2007                            DOB:          04-18-1928    PROBLEM LIST:  1. Asthma.  2. Allergic rhinitis.  3. Esophageal reflux.  4. Tinnitus.   HISTORY:  He had an upper respiratory illness with conjunctivitis for  which he went to the [facility] emergency room and  treated there with eye drops and Mucinex.  He went back a second time a  week later because his ear was stopped up.  I talked with him about  seeing his primary physician rather than going to the emergency room.  Somewhere in the timing he had also gone to an ophthalmologist for  conjunctivitis.  He asks for nasal spray.  We discussed his RAST testing  for food panel done at his request in October which was unimpressive  with some elevation only for chicken which he eats without problem.  I  advised him to eat what he wants and avoid foods that make him  uncomfortable.  Rhinitis symptoms and asthma have been relatively  inactive.  He rarely needs his Proair HFA inhaler.   MEDICATIONS:  1. Diovan 160.  2. Os-Cal.  3. Citalopram 20 mg.  4. Allergy vaccine.  5. Ranitidine 150 mg b.i.d.  6. Lorazepam 1/2 of a 1 mg tablet q.h.s. p.r.n.  7. Proair HFA.  8. Mucinex.   ALLERGIES:  No known drug allergies.   OBJECTIVE:  VITAL SIGNS:  Weight 166 pounds which if correct would  reflect a 40 pound weight loss since August.  He does not look to have  lost much.  HEENT:  Throat is clear.  There is a little cerumen in the ear canals  not obstructing and what I can see of the tympanic membranes look  normal.  Speech quality is normal.  Pharynx is clear, no stridor.  CHEST:  Clear.  Breathing unlabored.  HEART:  Sounds regular without murmur.  Note that he is recorded as  weighing 176 pounds October 24 with Dr. Jonny Ruiz.   IMPRESSION:  1. Significant weight loss may partly reflect his attempts to be more      careful with his diet, but should be noted by his primary care      physician.  2. Anxiety and depression.  3. Asthma and allergic rhinitis are currently well controlled.  I see      no basis for diagnosis of food allergy.   PLAN:  I have given sample of Nasacort AQ one spray each nostril daily  for him to try and call for prescription if helpful.  Schedule return in  one year, earlier p.r.n.     Clinton D. Maple Hudson, MD, Tonny Bollman, FACP  Electronically Signed    CDY/MedQ  DD: 01/25/2007  DT: 01/25/2007  Job #: [id number]

## 2010-07-13 NOTE — Assessment & Plan Note (Signed)
[facility]                             PULMONARY OFFICE NOTE   NAME:MOLENAlezander, [name]                       MRN:          [id number]  DATE:03/25/2006                            DOB:          1929-01-01    PULMONARY/ALLERGY FOLLOWUP:   PROBLEM:  1. Asthma.  2. Allergic rhinitis.  3. Esophageal reflux.   PRIMARY PHYSICIAN:  Illene Regulus   HISTORY:  Mr. Coccia had quit allergy vaccine a year ago.  We have  retested in January 2007, positive especially for grass, house dust, and  some weed and tree pollens.  He comes now requesting to restart allergy  shots, saying he felt better when he had been on them.  He has  experienced persistent and bothersome nasal congestion with postnasal  drainage.  Previous experience with nasal steroids has not made much  difference.  He does not remember Singulair.  Has had prostate surgery.   MEDICATION:  1. Altace 10 mg.  2. Advair 100/50.  3. Lopid.  4. Rescue albuterol inhaler.   No medication allergy.   OBJECTIVE:  Weight 229 pounds.  Blood pressure 146/84.  Pulse regular  67.  Room air saturation 96%.  There is moderate nasal congestion, pale  mucosa, clear mucous bridging.  Slight bubbling.  No visible polyps. No  erythema, nothing purulent.  His pharynx is clear.  CHEST:  Is quiet without wheeze or rales.  HEART:  Sounds regular without murmur.   IMPRESSION:  Exacerbation of rhinitis likely to have an allergic  component.  Asthma is stable.   I discussed allergy vaccine frankly with him.  He is convinced he felt  better on vaccine in the past and experienced no significant problems.  We have reviewed expectations, risks up to and including anaphylaxis and  death.   PLAN:  1. We are restarting allergy vaccine based on last previous skin      tests.  2. Sample trial Astelin once each nostril b.i.d. p.r.n.  3. Schedule return in 2 months vaccine follow up, earlier p.r.n.     Clinton D. Maple Hudson, MD,  Tonny Bollman, FACP  Electronically Signed    CDY/MedQ  DD: 03/25/2006  DT: 03/26/2006  Job #: [id number]

## 2010-07-13 NOTE — Op Note (Signed)
   NAME:  [name] [name], [name] [name]                          ACCOUNT NO.:  [id number]   MEDICAL RECORD NO.:  [id number]                   PATIENT TYPE:  AMB   LOCATION:  ENDO                                 FACILITY[facility]   PHYSICIAN:  Georgiana Spinner, M.D.                 DATE OF BIRTH:  1928-07-11   DATE OF PROCEDURE:  DATE OF DISCHARGE:                                 OPERATIVE REPORT   PROCEDURE:  Colonoscopy with biopsy.   INDICATIONS FOR PROCEDURE:  Colon polyps.   ANESTHESIA:  None given.   DESCRIPTION OF PROCEDURE:  With the patient mildly sedated in the left  lateral decubitus position, the Olympus videoscopic colonoscope was inserted  in the rectum and passed under direct vision to the cecum identified by the  ileocecal valve and appendiceal orifice both of which were photographed.  From this point, the colonoscope was slowly withdrawn taking circumferential  views of the entire colonic mucosa stopping only in the sigmoid colon where  changes of possible mild diverticulitis were noted with erythema and edema  of the mucosa. In the rectum, the endoscope was placed retroflexion to view  the anal canal from above and this appeared normal. The endoscope was  straightened and withdrawn. The patient's vital signs and pulse oximeter  remained stable. The patient tolerated the procedure well without apparent  complications.   FINDINGS:  Thickening of the sigmoid colon with changes of diverticulitis.   PLAN:  Watch the patient clinically and consider antibiotics if patient  develops abdominal pain.                                               Georgiana Spinner, M.D.    GMO/MEDQ  D:  09/03/2002  T:  09/03/2002  Job:  [id number]   cc:   Wilson Singer, M.D.  [address]  [place]  [place] [address]  Fax: [phone]

## 2010-07-13 NOTE — Op Note (Signed)
   NAME:  [name] [name], [name] [name]                          ACCOUNT NO.:  [id number]   MEDICAL RECORD NO.:  [id number]                   PATIENT TYPE:  AMB   LOCATION:  ENDO                                 FACILITY:  [facility]   PHYSICIAN:  Georgiana Spinner, M.D.                 DATE OF BIRTH:  11/08/1928   DATE OF PROCEDURE:  DATE OF DISCHARGE:                                 OPERATIVE REPORT   PROCEDURE:  Upper endoscopy.   INDICATIONS FOR PROCEDURE:  Gastroesophageal reflux disease.   ANESTHESIA:  Demerol 50, Versed 5.   DESCRIPTION OF PROCEDURE:  With the patient mildly sedated in the left  lateral decubitus position, the Olympus videoscopic endoscope was inserted  in the mouth and passed under direct vision through the esophagus which  appeared normal into the stomach The fundus, body, antrum, duodenal bulb and  second portion of the duodenum were visualized. From this point, the  endoscope was slowly withdrawn taking circumferential views of the duodenal  mucosa until the endoscope was then pulled back in the stomach, placed in  retroflexion to view the stomach from below. The endoscope was then  straightened and withdrawn taking circumferential views of the remaining  gastric and esophageal mucosa. The patient's vital signs and pulse oximeter  remained stable. The patient tolerated the procedure well without apparent  complications.   FINDINGS:  Unremarkable examination at this time.   PLAN:  Proceed to colonoscopy.                                               Georgiana Spinner, M.D.    GMO/MEDQ  D:  09/03/2002  T:  09/03/2002  Job:  [id number]   cc:   Wilson Singer, M.D.  [address]  [place]  [place] [address]  Fax: [phone]

## 2010-07-13 NOTE — Op Note (Signed)
NAMEJAMESEN, STAHNKE                ACCOUNT NO.:  [id number]   MEDICAL RECORD NO.:  [id number]          PATIENT TYPE:  AMB   LOCATION:  ENDO                         FACILITY:  [facility]   PHYSICIAN:  Georgiana Spinner, M.D.    DATE OF BIRTH:  1928-12-02   DATE OF PROCEDURE:  10/17/2004  DATE OF DISCHARGE:                                 OPERATIVE REPORT   PROCEDURE:  Colonoscopy.   INDICATIONS:  Colon polyps.   ANESTHESIA:  Demerol 60, Versed 5 mg.   DESCRIPTION OF PROCEDURE:  With the patient mildly sedated in the left  lateral decubitus position, a rectal exam was performed which was  unremarkable. Subsequently the Olympus videoscopic colonoscope was inserted  in the rectum and passed under direct vision to the cecum identified by the  ileocecal valve and base of cecum. After exploring the base of cecum, the  colonoscope was slowly withdrawn taking circumferential views of the colonic  mucosa stopping in the rectum which appeared normal on direct and showed  hemorrhoidal tissue on retroflexed view. The endoscope was straightened and  withdrawn. The patient's vital signs and pulse oximeter remained stable. The  patient tolerated the procedure well without apparent complications.   FINDINGS:  Significant diverticulosis of the sigmoid colon, internal  hemorrhoids, otherwise unremarkable colonoscopic examination.   PLAN:  Repeat in 5 years.           ______________________________  Georgiana Spinner, M.D.     GMO/MEDQ  D:  10/17/2004  T:  10/17/2004  Job:  [id number]   cc:   Ike Bene, M.D.  [address]  [place]  [place] [address]  Fax: [phone]

## 2010-07-13 NOTE — Assessment & Plan Note (Signed)
[facility]                             PULMONARY OFFICE NOTE   NAME:[name], [name]                       MRN:          [id number]  DATE:05/20/2006                            DOB:          26-Aug-1928    PULMONARY OFFICE FOLLOWUP   PROBLEMS:  1. Asthma.  2. Allergic rhinitis.  3. Esophageal reflux.   PRIMARY PHYSICIAN:  Dr. Illene [name].   HISTORY:  He comes today taking Cipro on his own after a course of  Doxycycline for left groin pain.  I encouraged him to go downstairs and  see Dr. Debby [name] about this when he finishes with me.  Remote history of  hernia repair.  He is progressing without problems on allergy vaccine  buildup having restarted in January per discussion at that time.  He  says sometimes his arms ache a little bit after his allergy shot, but  the part of the areas of his arms that he indicates are well away from  the injection sites.  Cannot tell if there is any real connection.  Astelin over-dries.  Noted a little bit of sneezing last week, nothing  sustained.  No wheeze.  No purulent or bloody discharge.   MEDICATIONS:  1. Altace 10 mg.  2. Allergy injection.  3. Pravastatin 40 mg.  4. Advair 100/50.  5. Albuterol rescue inhaler are used at intervals when needed, but      rarely.  6. Astelin was being used intermittently.   OBJECTIVE:  Weight 227 pounds, BP 180/98.  Pulse regular 74, room air  saturation 97%.  Obese, not in any real distress.  Nasal airway is edematous with some mucus stranding and crusting.  No  visible polyps.  No wheeze.  No edema.   IMPRESSION:  Rhinitis and asthma under fair control.  Watching for  seasonal change in symptoms as he continues to rebuild allergy vaccine.  We have again discussed allergy vaccine risks, benefits, and goals,  compared with other therapies.  I would like him to try again with the  nasal steroid inhaler.   PLAN:  1. Sample and prescription Nasacort AQ 1 to 2 sprays  each nostril      daily.  Discontinue Astelin.  2. Continue vaccine buildup.  Schedule return in 4 months.  3. I have asked him to see Dr. Debby [name] about his blood pressure and      left groin pain.     [name] D. Maple Hudson, MD, Tonny Bollman, FACP  Electronically Signed    CDY/MedQ  DD: 05/20/2006  DT: 05/20/2006  Job #: [id number]

## 2010-11-14 LAB — URINALYSIS, ROUTINE W REFLEX MICROSCOPIC
Bilirubin Urine: NEGATIVE
Hgb urine dipstick: NEGATIVE
Nitrite: NEGATIVE
Specific Gravity, Urine: 1.015
Urobilinogen, UA: 1
pH: 6.5

## 2010-11-14 LAB — DIFFERENTIAL
Basophils Relative: 0
Lymphocytes Relative: 26
Lymphs Abs: 0.9
Monocytes Absolute: 0.4
Monocytes Relative: 10
Neutro Abs: 2.3
Neutrophils Relative %: 64

## 2010-11-14 LAB — RAPID URINE DRUG SCREEN, HOSP PERFORMED
Barbiturates: NOT DETECTED
Cocaine: NOT DETECTED
Opiates: NOT DETECTED
Tetrahydrocannabinol: NOT DETECTED

## 2010-11-14 LAB — COMPREHENSIVE METABOLIC PANEL
Albumin: 3.6
Alkaline Phosphatase: 59
BUN: 9
Calcium: 9.3
Creatinine, Ser: 0.84
Glucose, Bld: 96
Potassium: 4.1
Total Protein: 6

## 2010-11-14 LAB — PROTIME-INR: INR: 1

## 2010-11-14 LAB — CBC
HCT: 42.1
Hemoglobin: 14.8
MCHC: 35.1
Platelets: 190
RDW: 15.1

## 2010-12-07 LAB — URINALYSIS, ROUTINE W REFLEX MICROSCOPIC
Glucose, UA: NEGATIVE
Ketones, ur: 40 — AB
Nitrite: NEGATIVE
Specific Gravity, Urine: 1.028
pH: 6

## 2010-12-07 LAB — URINE MICROSCOPIC-ADD ON

## 2011-06-06 DIAGNOSIS — M216X9 Other acquired deformities of unspecified foot: Secondary | ICD-10-CM | POA: Diagnosis not present

## 2011-06-06 DIAGNOSIS — M779 Enthesopathy, unspecified: Secondary | ICD-10-CM | POA: Diagnosis not present

## 2012-02-24 DIAGNOSIS — Z23 Encounter for immunization: Secondary | ICD-10-CM | POA: Diagnosis not present

## 2012-12-04 DIAGNOSIS — Z23 Encounter for immunization: Secondary | ICD-10-CM | POA: Diagnosis not present

## 2012-12-21 ENCOUNTER — Encounter: Payer: Self-pay | Admitting: Podiatry

## 2012-12-21 ENCOUNTER — Ambulatory Visit (INDEPENDENT_AMBULATORY_CARE_PROVIDER_SITE_OTHER): Payer: Medicare Other | Admitting: Podiatry

## 2012-12-21 VITALS — BP 158/64 | HR 86 | Resp 16 | Ht 71.0 in | Wt 220.0 lb

## 2012-12-21 DIAGNOSIS — M779 Enthesopathy, unspecified: Secondary | ICD-10-CM

## 2012-12-21 DIAGNOSIS — M216X9 Other acquired deformities of unspecified foot: Secondary | ICD-10-CM

## 2012-12-21 DIAGNOSIS — L84 Corns and callosities: Secondary | ICD-10-CM

## 2012-12-21 MED ORDER — TRIAMCINOLONE ACETONIDE 10 MG/ML IJ SUSP
5.0000 mg | Freq: Once | INTRAMUSCULAR | Status: AC
  Administered 2012-12-21: 5 mg via INTRA_ARTICULAR

## 2012-12-21 NOTE — Progress Notes (Signed)
Subjective:     Patient ID: [name] [name], male   DOB: [birth year], 79 y.o.   MRN: [id number]  HPI patient presents stating I am having a lot of pain underneath my left foot with fluid buildup in acorn callus formation that is very sore. States it's been going on for several years   Review of Systems  All other systems reviewed and are negative.       Objective:   Physical Exam  Nursing note and vitals reviewed. Constitutional: He appears well-developed.  Cardiovascular: Intact distal pulses.   Musculoskeletal: Normal range of motion.  Neurological: He is alert.  Skin: Skin is warm.   patient is found to have a sub-5 lesion that is painful with fluid buildup noted around it. Strength and range of motion was adequate within the foot structure    Assessment:     Plantar flexed fifth metatarsal with capsulitis and inflammation noted left fifth metatarsal head along with reactive callus formation    Plan:     H&P performed and condition discussed. Today I did a careful plantar injection 3 mg dexamethasone Kenalog Xylocaine mixture to reduce inflammation and after numbness I did debride the lesion fully with no iatrogenic bleeding noted reappoint when symptoms reoccur

## 2012-12-21 NOTE — Progress Notes (Signed)
N  SORE, THICK SKIN L   LT FOOT  D  2 YEARS O  SLOWLY C  SAME A  PRESSURE T  ALEVE  '' TOENAILS TRIM'' B/L FOOT

## 2013-09-28 DIAGNOSIS — Z23 Encounter for immunization: Secondary | ICD-10-CM | POA: Diagnosis not present

## 2013-09-30 ENCOUNTER — Encounter: Payer: Self-pay | Admitting: Internal Medicine

## 2013-10-05 DIAGNOSIS — D2370 Other benign neoplasm of skin of unspecified lower limb, including hip: Secondary | ICD-10-CM | POA: Diagnosis not present

## 2013-10-05 DIAGNOSIS — M7150 Other bursitis, not elsewhere classified, unspecified site: Secondary | ICD-10-CM | POA: Diagnosis not present

## 2013-10-05 DIAGNOSIS — M79609 Pain in unspecified limb: Secondary | ICD-10-CM | POA: Diagnosis not present

## 2013-10-13 ENCOUNTER — Emergency Department (HOSPITAL_COMMUNITY)
Admission: EM | Admit: 2013-10-13 | Discharge: 2013-10-13 | Disposition: A | Discharge status: Home / Self Care | Payer: Medicare Other | Attending: Emergency Medicine | Admitting: Emergency Medicine

## 2013-10-13 ENCOUNTER — Encounter (HOSPITAL_COMMUNITY): Payer: Self-pay | Admitting: Emergency Medicine

## 2013-10-13 ENCOUNTER — Emergency Department (HOSPITAL_COMMUNITY): Payer: Medicare Other

## 2013-10-13 ENCOUNTER — Emergency Department (INDEPENDENT_AMBULATORY_CARE_PROVIDER_SITE_OTHER)
Admission: EM | Admit: 2013-10-13 | Discharge: 2013-10-13 | Disposition: A | Discharge status: Nursing Home (Includes ICF) | Payer: Medicare Other | Source: Home / Self Care

## 2013-10-13 DIAGNOSIS — Z7982 Long term (current) use of aspirin: Secondary | ICD-10-CM | POA: Insufficient documentation

## 2013-10-13 DIAGNOSIS — Z8719 Personal history of other diseases of the digestive system: Secondary | ICD-10-CM | POA: Diagnosis not present

## 2013-10-13 DIAGNOSIS — I509 Heart failure, unspecified: Secondary | ICD-10-CM | POA: Diagnosis not present

## 2013-10-13 DIAGNOSIS — R5383 Other fatigue: Secondary | ICD-10-CM | POA: Diagnosis not present

## 2013-10-13 DIAGNOSIS — R5381 Other malaise: Secondary | ICD-10-CM

## 2013-10-13 DIAGNOSIS — R42 Dizziness and giddiness: Secondary | ICD-10-CM

## 2013-10-13 DIAGNOSIS — I4949 Other premature depolarization: Secondary | ICD-10-CM | POA: Diagnosis not present

## 2013-10-13 DIAGNOSIS — R0609 Other forms of dyspnea: Secondary | ICD-10-CM

## 2013-10-13 DIAGNOSIS — Z87448 Personal history of other diseases of urinary system: Secondary | ICD-10-CM | POA: Insufficient documentation

## 2013-10-13 DIAGNOSIS — I5042 Chronic combined systolic (congestive) and diastolic (congestive) heart failure: Secondary | ICD-10-CM | POA: Diagnosis not present

## 2013-10-13 DIAGNOSIS — R531 Weakness: Secondary | ICD-10-CM

## 2013-10-13 DIAGNOSIS — R0602 Shortness of breath: Secondary | ICD-10-CM | POA: Diagnosis not present

## 2013-10-13 DIAGNOSIS — R609 Edema, unspecified: Secondary | ICD-10-CM | POA: Diagnosis not present

## 2013-10-13 DIAGNOSIS — I499 Cardiac arrhythmia, unspecified: Secondary | ICD-10-CM | POA: Insufficient documentation

## 2013-10-13 DIAGNOSIS — I10 Essential (primary) hypertension: Secondary | ICD-10-CM | POA: Insufficient documentation

## 2013-10-13 DIAGNOSIS — R0600 Dyspnea, unspecified: Secondary | ICD-10-CM

## 2013-10-13 DIAGNOSIS — Z8659 Personal history of other mental and behavioral disorders: Secondary | ICD-10-CM | POA: Insufficient documentation

## 2013-10-13 DIAGNOSIS — J45909 Unspecified asthma, uncomplicated: Secondary | ICD-10-CM | POA: Diagnosis not present

## 2013-10-13 DIAGNOSIS — I493 Ventricular premature depolarization: Secondary | ICD-10-CM

## 2013-10-13 DIAGNOSIS — R0989 Other specified symptoms and signs involving the circulatory and respiratory systems: Secondary | ICD-10-CM

## 2013-10-13 DIAGNOSIS — R9431 Abnormal electrocardiogram [ECG] [EKG]: Secondary | ICD-10-CM | POA: Diagnosis not present

## 2013-10-13 HISTORY — DX: Benign prostatic hyperplasia without lower urinary tract symptoms: N40.0

## 2013-10-13 HISTORY — DX: Diverticulosis of large intestine without perforation or abscess without bleeding: K57.30

## 2013-10-13 HISTORY — DX: Chronic rhinitis: J31.0

## 2013-10-13 HISTORY — DX: Anxiety disorder, unspecified: F41.9

## 2013-10-13 HISTORY — DX: Essential (primary) hypertension: I10

## 2013-10-13 HISTORY — DX: Diaphragmatic hernia without obstruction or gangrene: K44.9

## 2013-10-13 HISTORY — DX: Unspecified asthma, uncomplicated: J45.909

## 2013-10-13 HISTORY — DX: Gastro-esophageal reflux disease without esophagitis: K21.9

## 2013-10-13 HISTORY — DX: Major depressive disorder, single episode, unspecified: F32.9

## 2013-10-13 HISTORY — DX: Depression, unspecified: F32.A

## 2013-10-13 LAB — CBC WITH DIFFERENTIAL/PLATELET
Basophils Absolute: 0 10*3/uL (ref 0.0–0.1)
Basophils Relative: 0 % (ref 0–1)
EOS ABS: 0 10*3/uL (ref 0.0–0.7)
Eosinophils Relative: 1 % (ref 0–5)
HCT: 47.9 % (ref 39.0–52.0)
Hemoglobin: 16.3 g/dL (ref 13.0–17.0)
LYMPHS ABS: 0.8 10*3/uL (ref 0.7–4.0)
Lymphocytes Relative: 14 % (ref 12–46)
MCH: 30.2 pg (ref 26.0–34.0)
MCHC: 34 g/dL (ref 30.0–36.0)
MCV: 88.7 fL (ref 78.0–100.0)
Monocytes Absolute: 0.6 10*3/uL (ref 0.1–1.0)
Monocytes Relative: 11 % (ref 3–12)
NEUTROS PCT: 74 % (ref 43–77)
Neutro Abs: 4.3 10*3/uL (ref 1.7–7.7)
PLATELETS: 150 10*3/uL (ref 150–400)
RBC: 5.4 MIL/uL (ref 4.22–5.81)
RDW: 15 % (ref 11.5–15.5)
WBC: 5.8 10*3/uL (ref 4.0–10.5)

## 2013-10-13 LAB — COMPREHENSIVE METABOLIC PANEL
ALBUMIN: 3.7 g/dL (ref 3.5–5.2)
ALK PHOS: 65 U/L (ref 39–117)
ALT: 48 U/L (ref 0–53)
AST: 36 U/L (ref 0–37)
Anion gap: 13 (ref 5–15)
BUN: 11 mg/dL (ref 6–23)
CO2: 23 mEq/L (ref 19–32)
Calcium: 9.1 mg/dL (ref 8.4–10.5)
Chloride: 104 mEq/L (ref 96–112)
Creatinine, Ser: 0.85 mg/dL (ref 0.50–1.35)
GFR calc non Af Amer: 78 mL/min — ABNORMAL LOW (ref >90)
GLUCOSE: 106 mg/dL — AB (ref 70–99)
POTASSIUM: 4.4 meq/L (ref 3.7–5.3)
Sodium: 140 mEq/L (ref 137–147)
TOTAL PROTEIN: 6.5 g/dL (ref 6.0–8.3)
Total Bilirubin: 1.1 mg/dL (ref 0.3–1.2)

## 2013-10-13 LAB — PRO B NATRIURETIC PEPTIDE: Pro B Natriuretic peptide (BNP): 2668 pg/mL — ABNORMAL HIGH (ref 0–450)

## 2013-10-13 LAB — I-STAT TROPONIN, ED: Troponin i, poc: 0.08 ng/mL (ref 0.00–0.08)

## 2013-10-13 MED ORDER — LISINOPRIL 5 MG PO TABS: 5.0000 mg | ORAL_TABLET | Freq: Every day | ORAL | Status: DC

## 2013-10-13 MED ORDER — FUROSEMIDE 10 MG/ML IJ SOLN
40.0000 mg | Freq: Once | INTRAMUSCULAR | Status: AC
  Administered 2013-10-13: 40 mg via INTRAVENOUS
  Filled 2013-10-13: qty 4

## 2013-10-13 MED ORDER — IPRATROPIUM-ALBUTEROL 0.5-2.5 (3) MG/3ML IN SOLN: 3.0000 mL | Freq: Four times a day (QID) | RESPIRATORY_TRACT | Status: DC | PRN

## 2013-10-13 MED ORDER — ASPIRIN 81 MG PO CHEW
81.0000 mg | CHEWABLE_TABLET | Freq: Every day | ORAL | Status: DC
Start: 2013-10-13 — End: 2013-10-13

## 2013-10-13 MED ORDER — FUROSEMIDE 40 MG PO TABS: 20.0000 mg | ORAL_TABLET | Freq: Two times a day (BID) | ORAL | Status: DC

## 2013-10-13 MED ORDER — LISINOPRIL 10 MG PO TABS
5.0000 mg | ORAL_TABLET | Freq: Every day | ORAL | Status: DC
  Administered 2013-10-13: 5 mg via ORAL
  Filled 2013-10-13: qty 1

## 2013-10-13 MED ORDER — METOPROLOL TARTRATE 25 MG PO TABS: 12.5000 mg | ORAL_TABLET | Freq: Two times a day (BID) | ORAL | Status: DC

## 2013-10-13 MED ORDER — METOPROLOL TARTRATE 12.5 MG HALF TABLET: 12.5000 mg | ORAL_TABLET | Freq: Two times a day (BID) | ORAL | Status: DC

## 2013-10-13 MED ORDER — IPRATROPIUM-ALBUTEROL 18-103 MCG/ACT IN AERO: 1.0000 | INHALATION_SPRAY | Freq: Four times a day (QID) | RESPIRATORY_TRACT | Status: DC | PRN

## 2013-10-13 MED ORDER — FUROSEMIDE 20 MG PO TABS: 20.0000 mg | ORAL_TABLET | Freq: Two times a day (BID) | ORAL | Status: DC

## 2013-10-13 MED ORDER — ASPIRIN 81 MG PO CHEW
CHEWABLE_TABLET | ORAL | Status: AC
  Filled 2013-10-13: qty 4

## 2013-10-13 MED ORDER — SODIUM CHLORIDE 0.9 % IV SOLN
Freq: Once | INTRAVENOUS | Status: AC
  Administered 2013-10-13: 10:00:00 via INTRAVENOUS

## 2013-10-13 MED ORDER — IPRATROPIUM-ALBUTEROL 20-100 MCG/ACT IN AERS: 1.0000 | INHALATION_SPRAY | Freq: Four times a day (QID) | RESPIRATORY_TRACT | Status: DC | PRN

## 2013-10-13 MED ORDER — LEVALBUTEROL HCL 0.63 MG/3ML IN NEBU: 0.6300 mg | INHALATION_SOLUTION | Freq: Three times a day (TID) | RESPIRATORY_TRACT | Status: DC

## 2013-10-13 MED ORDER — LEVALBUTEROL HCL 1.25 MG/0.5ML IN NEBU: 1.2500 mg | INHALATION_SOLUTION | Freq: Three times a day (TID) | RESPIRATORY_TRACT | Status: DC

## 2013-10-13 MED ORDER — FUROSEMIDE 40 MG PO TABS: 40.0000 mg | ORAL_TABLET | Freq: Two times a day (BID) | ORAL | Status: DC

## 2013-10-13 MED ORDER — LEVALBUTEROL HCL 0.63 MG/3ML IN NEBU
0.6300 mg | INHALATION_SOLUTION | Freq: Three times a day (TID) | RESPIRATORY_TRACT | Status: DC
  Administered 2013-10-13: 0.63 mg via RESPIRATORY_TRACT
  Filled 2013-10-13 (×2): qty 3

## 2013-10-13 NOTE — H&P (Addendum)
[name] [name] is an 80 y.o. male.   Chief Complaint: Irregular heart rate HPI: Patient is a 80 year old Caucasian male with history of severe bronchial asthma, who is a nonsmoker, who states that he has prior history of hypertension, GERD, states that in the community screening, he was told to have atrial fibrillation sometime in June 2015. He has been concerned about this and over the past 2 weeks or so he was not just feeling well and felt very fatigued.  He was evaluated in the urgent care, felt to be dyspneic, frequent PVCs, hence was recommended to go to the emergency room for further evaluation.  I was called to see the patient for elevation of congestive heart failure as chest x-ray revealed mild perivascular congestion and BNP was markedly elevated.  Patient on further questioning states that he has chronic shortness of breath and dyspnea on exertion but denies recent exacerbation. He states that he sleeps sitting in the house due to his hiatal hernia and GERD and this has remained stable. However he has noticed marked fatigue over the past 2 weeks. He denies any productive sputum, fever, nausea or vomiting.  He denies any palpitations, he has had occasional dizziness when he stands up quickly. No chest pain. Denies any leg edema. Denies symptoms to suggest PND although he has chronic orthopnea. He states that he has been checked for sleep apnea many years ago and was told to have no sleep apnea.  He is single, lives by himself, pretty much eats outside. He states that he has not seen Dr. Baird Lyons in many years for his pulmonary issues and frontal asthma.  Past Medical History  Diagnosis Date  . Hiatal hernia   . Asthma   . BPH (benign prostatic hyperplasia)   . Anxiety   . Hypertension   . Depression   . GERD (gastroesophageal reflux disease)   . Diverticulosis of colon (without mention of hemorrhage)   . Rhinitis     History reviewed. No pertinent past surgical  history.  No family history on file.  there is no family history of premature coronary artery disease or diabetes mellitus. Social History:  reports that he has never smoked. He does not have any smokeless tobacco history on file. He reports that he does not drink alcohol or use illicit drugs.  Allergies: No Known Allergies  Review of Systems - Negative except Chronic dyspnea and shortness of breath, fatigue, occasional dizziness, denies diabetes mellitus. Denies neurologic deficits. Denies symptoms this is claudication or TIA. No dark stools or bloody stools although he admits having GERD and diverticulosis in the past. Other systems negative.  Blood pressure 140/81, pulse 95, temperature 97.5 F (36.4 C), temperature source Oral, resp. rate 28, weight 99.791 kg (220 lb), SpO2 93.00%. General appearance: alert, cooperative, appears stated age, mild distress and moderately obese Eyes: negative findings: conjunctivae and sclerae normal Neck: no adenopathy, no carotid bruit, supple, symmetrical, trachea midline, thyroid not enlarged, symmetric, no tenderness/mass/nodules and Short neck. Neck: JVP - normal, carotids 2+= without bruits Resp: wheezes bilaterally and Diffuse. Scattered rhonchi also heard diffusely. Chest wall: no tenderness Cardio: regular rate and rhythm, S1, S2 normal, no murmur, click, rub or gallop and Distant heart sounds, frequent ectopy heard. GI: soft, non-tender; bowel sounds normal; no masses,  no organomegaly and Pannus present. Extremities: extremities normal, atraumatic, no cyanosis or edema Pulses: 2+ and symmetric Skin: Skin color, texture, turgor normal. No rashes or lesions Neurologic: Grossly normal  Results for orders  placed during the hospital encounter of 10/13/13 (from the past 48 hour(s))  CBC WITH DIFFERENTIAL     Status: None   Collection Time    10/13/13 10:53 AM      Result Value Ref Range   WBC 5.8  4.0 - 10.5 K/uL   RBC 5.40  4.22 - 5.81 MIL/uL    Hemoglobin 16.3  13.0 - 17.0 g/dL   HCT 47.9  39.0 - 52.0 %   MCV 88.7  78.0 - 100.0 fL   MCH 30.2  26.0 - 34.0 pg   MCHC 34.0  30.0 - 36.0 g/dL   RDW 15.0  11.5 - 15.5 %   Platelets 150  150 - 400 K/uL   Neutrophils Relative % 74  43 - 77 %   Neutro Abs 4.3  1.7 - 7.7 K/uL   Lymphocytes Relative 14  12 - 46 %   Lymphs Abs 0.8  0.7 - 4.0 K/uL   Monocytes Relative 11  3 - 12 %   Monocytes Absolute 0.6  0.1 - 1.0 K/uL   Eosinophils Relative 1  0 - 5 %   Eosinophils Absolute 0.0  0.0 - 0.7 K/uL   Basophils Relative 0  0 - 1 %   Basophils Absolute 0.0  0.0 - 0.1 K/uL  COMPREHENSIVE METABOLIC PANEL     Status: Abnormal   Collection Time    10/13/13 10:53 AM      Result Value Ref Range   Sodium 140  137 - 147 mEq/L   Potassium 4.4  3.7 - 5.3 mEq/L   Chloride 104  96 - 112 mEq/L   CO2 23  19 - 32 mEq/L   Glucose, Bld 106 (*) 70 - 99 mg/dL   BUN 11  6 - 23 mg/dL   Creatinine, Ser 0.85  0.50 - 1.35 mg/dL   Calcium 9.1  8.4 - 10.5 mg/dL   Total Protein 6.5  6.0 - 8.3 g/dL   Albumin 3.7  3.5 - 5.2 g/dL   AST 36  0 - 37 U/L   ALT 48  0 - 53 U/L   Alkaline Phosphatase 65  39 - 117 U/L   Total Bilirubin 1.1  0.3 - 1.2 mg/dL   GFR calc non Af Amer 78 (*) >90 mL/min   GFR calc Af Amer >90  >90 mL/min   Comment: (NOTE)     The eGFR has been calculated using the CKD EPI equation.     This calculation has not been validated in all clinical situations.     eGFR's persistently <90 mL/min signify possible Chronic Kidney     Disease.   Anion gap 13  5 - 15  PRO B NATRIURETIC PEPTIDE     Status: Abnormal   Collection Time    10/13/13 10:53 AM      Result Value Ref Range   Pro B Natriuretic peptide (BNP) 2668.0 (*) 0 - 450 pg/mL  I-STAT TROPOININ, ED     Status: None   Collection Time    10/13/13 11:02 AM      Result Value Ref Range   Troponin i, poc 0.08  0.00 - 0.08 ng/mL   Comment 3            Comment: Due to the release kinetics of cTnI,     a negative result within the first hours      of the onset of symptoms does not rule out     myocardial infarction  with certainty.     If myocardial infarction is still suspected,     repeat the test at appropriate intervals.   Dg Chest 2 View  10/13/2013   CLINICAL DATA:  One week of weakness and dizziness and irregular heart rate. ; history of asthma  EXAM: CHEST  2 VIEW  COMPARISON:  PA and lateral chest x-ray of December 27, 2006  FINDINGS: The lungs are well-expanded. The interstitial markings are increased bilaterally. The left hemi correction the hemidiaphragms are less distinct today. The cardiac silhouette is enlarged. The central pulmonary vascularity is prominent. No significant pleural fluid collections are demonstrated. There is mild degenerative disc disease at multiple thoracic levels.  IMPRESSION: Mild CHF superimposed upon reactive airway disease. There is no alveolar pneumonia.   Electronically Signed   By: David  Martinique   On: 10/13/2013 11:33    Labs:   Lab Results  Component Value Date   WBC 5.8 10/13/2013   HGB 16.3 10/13/2013   HCT 47.9 10/13/2013   MCV 88.7 10/13/2013   PLT 150 10/13/2013    Recent Labs Lab 10/13/13 1053  NA 140  K 4.4  CL 104  CO2 23  BUN 11  CREATININE 0.85  CALCIUM 9.1  PROT 6.5  BILITOT 1.1  ALKPHOS 65  ALT 48  AST 36  GLUCOSE 106*   No results found for this basename: CKTOTAL, CKMB, CKMBINDEX, TROPONINI    Lipid Panel     Component Value Date/Time   CHOL 174 10/14/2006 0824   TRIG 98 10/14/2006 0824   HDL 19.4* 10/14/2006 0824   CHOLHDL 9.0 CALC 10/14/2006 0824   VLDL 20 10/14/2006 0824   LDLCALC 135* 10/14/2006 0824   BNP (last 3 results)  Recent Labs  10/13/13 1053  PROBNP 2668.0*     EKG: Sinus rhythm with first degree AV block, P-Pulmonale. Left axis deviation, left into physical block. Cannot exclude anterior infarct old. LVH with repolarization abnormality, cannot exclude lateral ischemia. Frequent PVCs, some in bigeminal pattern.  Assessment/Plan 1.  Shortness of breath and dyspnea on exertion which is chronic, patient probably has had worsening in the past 2 weeks. Multifactorial including chronic bronchial asthma, obesity hypoventilation, acute on chronic diastolic heart failure contributing. 2. Acute on chronic diastolic heart failure. 3. History of hyperlipidemia 4. History of hypertension 5. Obesity, moderate 6. Abnormal EKG, cardiomegaly on chest x-ray.  Recommendation: Patient although denies significant symptoms, he clearly appear to be dyspneic on examination. Patient states that this is his baseline. I advised him hospital admission for diuresis and further evaluation of his cardiac status, patient request that he be discharged.  He is willing to follow up with me in the outpatient basis. I given him my contact. Would also recommend discharging him on furosemide 20 mg every morning and 3 PM, lisinopril 5 mg q. daily along with metoprolol tartrate 2.5 mg by mouth twice a day. I would also recommend discharging him on Combivent inhaler or some form of inhaler. ASA 81 mg q daily.  I've discussed the discharge plans with the emergency department. Would observe him for a little longer until he is comfortable and then can be discharged as per his request.  I have set up an appointment to see me back in the office, patient will be seeing my partner Dr. Vear Clock.   He'll need outpatient workup, I also recommended that he follow up with Dr. Baird Lyons. I have discussed with him regarding salt restricted diet in detail. Hopefully he'll  make some changes with his diet, patient eats out every day. Patient states that he'll comply with these instructions.  Laverda Page, MD 10/13/2013, 4:21 PM [facility]. [place] Pager: [phone] Office: [phone] If no answer: Cell:  [phone]

## 2013-10-13 NOTE — ED Notes (Signed)
Per [facility]- Pt comes from [facility], was at health fair 5 months ago, told he had AFIB and went to [facility] requesting EKG after he talked to his niece. EKG SR with PVCs. Rhythm showing ventricular bigemy. BP 184/94. Denies pain, reports his doctor has retired.

## 2013-10-13 NOTE — ED Provider Notes (Signed)
Medical screening examination/treatment/procedure(s) were performed by non-physician practitioner and as supervising physician I was immediately available for consultation/collaboration.  Philipp Deputy, M.D.  Harden Mo, MD 10/13/13 [phone]

## 2013-10-13 NOTE — ED Notes (Signed)
Pt stating that he wants to leave hospital

## 2013-10-13 NOTE — ED Provider Notes (Signed)
CSN: [id number]     Arrival date & time 10/13/13  0825 History   First MD Initiated Contact with Patient 10/13/13 [phone]     Chief Complaint  Patient presents with  . Weakness   (Consider location/radiation/quality/duration/timing/severity/associated sxs/prior Treatment) HPI Comments: 80 year old male complaining of feeling weak for one week. States he has been having dizziness this week and sensation as if he were going to fall. He has a history of hiatal hernia and ulcers and as interpreted the discomfort across the lower chest as coming from one of these problems. He denies heaviness, tightness, fullness or pressure. In the past 2-3 days he has had some shortness of breath which is unusual for him. He denies nausea, vomiting, diarrhea or diaphoresis. The reason for his visit today was family prompting. Currently without any chest pain or discomfort. With mild intermittent dyspnea. He states he was told by a health screening clinic in October of 2014 he had atrophic but never followed up with a physician.    History reviewed. No pertinent past medical history. History reviewed. No pertinent past surgical history. History reviewed. No pertinent family history. History  Substance Use Topics  . Smoking status: Never Smoker   . Smokeless tobacco: Not on file  . Alcohol Use: No    Review of Systems  Constitutional: Positive for activity change. Negative for fever.  HENT: Negative.   Respiratory: Positive for cough and shortness of breath. Negative for wheezing.   Cardiovascular: Negative for chest pain, palpitations and leg swelling.  Gastrointestinal: Negative for nausea, vomiting, abdominal pain, diarrhea, constipation and abdominal distention.  Genitourinary: Negative.   Musculoskeletal: Negative.   Skin: Negative for color change and rash.  Neurological: Positive for dizziness, weakness and light-headedness. Negative for tremors, syncope and headaches.    Allergies  Review of  patient's allergies indicates no known allergies.  Home Medications   Prior to Admission medications   Medication Sig Start Date End Date Taking? Authorizing Provider  naproxen sodium (ANAPROX) 220 MG tablet Take 220 mg by mouth 2 (two) times daily with a meal.    Historical Provider, MD   BP 176/76  Pulse 62  Temp(Src) 97.9 F (36.6 C) (Oral)  Resp 12  SpO2 95% Physical Exam  Nursing note and vitals reviewed. Constitutional: He is oriented to person, place, and time. He appears well-developed and well-nourished. No distress.  HENT:  Mouth/Throat: Oropharynx is clear and moist. No oropharyngeal exudate.  Eyes: Conjunctivae and EOM are normal.  Neck: Normal range of motion. Neck supple.  Cardiovascular: Normal rate and intact distal pulses.   Murmur heard. SEM loudest L sternal border. Irregular rhythm  Pulmonary/Chest: No respiratory distress. He has rales.  Mild shortness of breath Bibasilar crackles, End expiratory wheeze with forced expiration.  Musculoskeletal: He exhibits no edema and no tenderness.  Lymphadenopathy:    He has no cervical adenopathy.  Neurological: He is alert and oriented to person, place, and time. He exhibits normal muscle tone.  Skin: Skin is warm and dry. No rash noted. No erythema.  Psychiatric: He has a normal mood and affect.    ED Course  Procedures (including critical care time) Labs Review Labs Reviewed - No data to display  Imaging Review No results found. EKG: NSR. Frequent PVC's. Ventricular bigemminy on monitor for prolonged periods. LAD. AT wave inversion aVL. QS in inferior leads. ST elevation V3, V4,V5, likely early repol.  MDM   1. Dyspnea   2. Weakness   3. EKG abnormalities  4. Frequent unifocal PVCs   5. Dizziness   6. Asthma, unspecified asthma severity, uncomplicated    Transfer to [facility] ED for evaluation of above sx's and abnormal EKG.    Janne Napoleon, NP 10/13/13 [phone]

## 2013-10-13 NOTE — ED Notes (Signed)
Security taking pt to [facility] to get his car.

## 2013-10-13 NOTE — ED Notes (Signed)
Placed  On  Cardiac  Monitor   Nasal  02  Art  2  l  /  Min     Iv  Ns  tko  18  Angio  r hand

## 2013-10-13 NOTE — Discharge Instructions (Signed)

## 2013-10-13 NOTE — ED Notes (Addendum)
Pt  States  He  Has  A  History of  A    Fib  But    Nothing  In his history   About   It    And  gerd    He  Reports  Had  Some  Epigastric pain  Recently  None  Now     He  Is  On no  Cardiac  meds  And  Is  A  Vague  Historian

## 2013-10-13 NOTE — ED Provider Notes (Signed)
CSN: [id number]     Arrival date & time 10/13/13  9381 History   First MD Initiated Contact with Patient 10/13/13 1008     Chief Complaint  Patient presents with  . Irregular Heart Beat   [name] [name] is an 80 yo caucasian M w/PMH of hiatal hernia, asthma, BPH, and HTN who presents today from urgent care w/complaints of abnormal heart rhythm. Patient states that in October of this past year he went to a health fair and was later told that he might have A. fib and to seek medical care. Patient did not. He presents today due to to family urging as another family member had A. fib in almost died. Patient presented to urgent care this morning and complained that he had had some dizziness last week and some weakness. He says he's had no falls but the feeling has gone away. He's never had any palpitations, chest pain. He does say that his upper abdomen sometimes hurts which he attributes to his hiatal hernia.  He denies CP, SOB, fever, chills, N/V, abd pain, diarrhea, constipation, hematemesis, dysuria, hematuria, sick contacts, or recent travel.   (Consider location/radiation/quality/duration/timing/severity/associated sxs/prior Treatment) Patient is a 80 y.o. male presenting with weakness.  Weakness This is a new problem. The current episode started 1 to 4 weeks ago. The problem has been gradually worsening. Associated symptoms include fatigue and weakness. Pertinent negatives include no abdominal pain, anorexia, arthralgias, change in bowel habit, chest pain, chills, congestion, coughing, diaphoresis, fever, headaches, joint swelling, nausea, neck pain, numbness, urinary symptoms, vertigo, visual change or vomiting. Nothing aggravates the symptoms.    Past Medical History  Diagnosis Date  . Hiatal hernia   . Asthma   . BPH (benign prostatic hyperplasia)   . Anxiety   . Hypertension   . Depression   . GERD (gastroesophageal reflux disease)   . Diverticulosis of colon (without mention of  hemorrhage)   . Rhinitis    History reviewed. No pertinent past surgical history. No family history on file. History  Substance Use Topics  . Smoking status: Never Smoker   . Smokeless tobacco: Not on file  . Alcohol Use: No    Review of Systems  Constitutional: Positive for fatigue. Negative for fever, chills and diaphoresis.  HENT: Negative for congestion.   Respiratory: Positive for shortness of breath. Negative for cough, chest tightness, wheezing and stridor.   Cardiovascular: Positive for leg swelling. Negative for chest pain and palpitations.  Gastrointestinal: Negative for nausea, vomiting, abdominal pain, diarrhea, constipation, abdominal distention, anorexia and change in bowel habit.  Genitourinary: Negative for dysuria, frequency, flank pain and decreased urine volume.  Musculoskeletal: Negative for arthralgias, joint swelling and neck pain.  Neurological: Positive for weakness. Negative for dizziness, vertigo, speech difficulty, light-headedness, numbness and headaches.  All other systems reviewed and are negative.     Allergies  Review of patient's allergies indicates no known allergies.  Home Medications   Prior to Admission medications   Medication Sig Start Date End Date Taking? Authorizing Provider  aspirin EC 81 MG tablet Take 81 mg by mouth daily.   Yes Historical Provider, MD  OVER THE COUNTER MEDICATION Take 2 tablets by mouth every 4 (four) hours as needed (for shortness of breath). Lung Clear   Yes Historical Provider, MD  furosemide (LASIX) 40 MG tablet Take 1 tablet (40 mg total) by mouth 2 (two) times daily. 10/13/13   Sherian Maroon, MD  levalbuterol Penne Lash) 0.63 MG/3ML nebulizer solution Take 3 mLs (  0.63 mg total) by nebulization 3 (three) times daily. 10/13/13   Sherian Maroon, MD   BP 150/80  Pulse 95  Temp(Src) 97.5 F (36.4 C) (Oral)  Resp 16  Wt 220 lb (99.791 kg)  SpO2 98% Physical Exam  Nursing note and vitals reviewed. Constitutional: He is  oriented to person, place, and time. He appears well-developed and well-nourished. No distress.  HENT:  Head: Normocephalic and atraumatic.  Eyes: Pupils are equal, round, and reactive to light.  Neck: Normal range of motion.  Cardiovascular: Normal rate, regular rhythm, normal heart sounds and intact distal pulses.  Exam reveals no gallop and no friction rub.   No murmur heard. Pulmonary/Chest: Effort normal and breath sounds normal. No respiratory distress. He has no wheezes. He has no rales. He exhibits no tenderness.  Abdominal: Soft. Bowel sounds are normal. He exhibits no distension and no mass. There is no tenderness. There is no rebound and no guarding.  Musculoskeletal: Normal range of motion. He exhibits edema (1+ pitting edema in LEs).  Lymphadenopathy:    He has no cervical adenopathy.  Neurological: He is alert and oriented to person, place, and time.  Skin: Skin is warm and dry. He is not diaphoretic.    ED Course  Procedures (including critical care time) Labs Review Labs Reviewed  COMPREHENSIVE METABOLIC PANEL - Abnormal; Notable for the following:    Glucose, Bld 106 (*)    GFR calc non Af Amer 78 (*)    All other components within normal limits  PRO B NATRIURETIC PEPTIDE - Abnormal; Notable for the following:    Pro B Natriuretic peptide (BNP) 2668.0 (*)    All other components within normal limits  CBC WITH DIFFERENTIAL  I-STAT TROPOININ, ED    Imaging Review Dg Chest 2 View  10/13/2013   CLINICAL DATA:  One week of weakness and dizziness and irregular heart rate. ; history of asthma  EXAM: CHEST  2 VIEW  COMPARISON:  PA and lateral chest x-ray of December 27, 2006  FINDINGS: The lungs are well-expanded. The interstitial markings are increased bilaterally. The left hemi correction the hemidiaphragms are less distinct today. The cardiac silhouette is enlarged. The central pulmonary vascularity is prominent. No significant pleural fluid collections are demonstrated.  There is mild degenerative disc disease at multiple thoracic levels.  IMPRESSION: Mild CHF superimposed upon reactive airway disease. There is no alveolar pneumonia.   Electronically Signed   By: David  Martinique   On: 10/13/2013 11:33     EKG Interpretation None      MDM   80 yo caucasian M w/complaints of abnormal rhythm. Please see HPI for details. On exam, pt in NAD, AFVSS. Patient wearing 2 L nasal cannula of oxygen. However denies any shortness of breath. Exam reveals no wheezes, rales, rhonchi, basilar crackles. I do hear 3/6 systolic murmur best heard over the right sternal border. Does not radiate to carotids. Mild pitting edema in LEs. Remainder of exam benign.   Will obtain EKG, CMP, CBC, troponin, "BNP and chest x-ray. EKG shows no sign of A. Fib, with ventricular bigeminy. Electronically normal limits. BNP over 2600 and chest x-ray consistent with fluid overload with pulmonary edema. Troponin detectable at 0.08. Patient given 40 mg IV Lasix. Suspect his symptoms of shortness of breath and weakness and fatigue are due to his CHF has been undiagnosed. Consult cardiology.  Cardiology recommends admission the patient wants to go home. Therefore patient will be discharged home with prescriptions for combivent for  his asthma and Lasix twice a day as well as metoprolol and lisinopril for HTN. He is to followup with cardiology as an outpatient. Patient understands his prescription regimen and has no questions at this time. Strict her precautions include severe shortness of breath, chest pain, worsening weakness/fatigue/syncope or other signs of fluid overload.  Final diagnoses:  Congestive heart failure, unspecified congestive heart failure chronicity, unspecified congestive heart failure type    Pt was seen under the supervision of Dr. Jeanell Sparrow.      Sherian Maroon, MD 10/13/13 [phone]

## 2013-10-14 NOTE — ED Provider Notes (Signed)
80 y.o. Male with palpitations sent from urgent care with some weakness. Patient appears hemodynamically stable. Results for orders placed during the hospital encounter of 10/13/13  CBC WITH DIFFERENTIAL      Result Value Ref Range   WBC 5.8  4.0 - 10.5 K/uL   RBC 5.40  4.22 - 5.81 MIL/uL   Hemoglobin 16.3  13.0 - 17.0 g/dL   HCT 47.9  39.0 - 52.0 %   MCV 88.7  78.0 - 100.0 fL   MCH 30.2  26.0 - 34.0 pg   MCHC 34.0  30.0 - 36.0 g/dL   RDW 15.0  11.5 - 15.5 %   Platelets 150  150 - 400 K/uL   Neutrophils Relative % 74  43 - 77 %   Neutro Abs 4.3  1.7 - 7.7 K/uL   Lymphocytes Relative 14  12 - 46 %   Lymphs Abs 0.8  0.7 - 4.0 K/uL   Monocytes Relative 11  3 - 12 %   Monocytes Absolute 0.6  0.1 - 1.0 K/uL   Eosinophils Relative 1  0 - 5 %   Eosinophils Absolute 0.0  0.0 - 0.7 K/uL   Basophils Relative 0  0 - 1 %   Basophils Absolute 0.0  0.0 - 0.1 K/uL  COMPREHENSIVE METABOLIC PANEL      Result Value Ref Range   Sodium 140  137 - 147 mEq/L   Potassium 4.4  3.7 - 5.3 mEq/L   Chloride 104  96 - 112 mEq/L   CO2 23  19 - 32 mEq/L   Glucose, Bld 106 (*) 70 - 99 mg/dL   BUN 11  6 - 23 mg/dL   Creatinine, Ser 0.85  0.50 - 1.35 mg/dL   Calcium 9.1  8.4 - 10.5 mg/dL   Total Protein 6.5  6.0 - 8.3 g/dL   Albumin 3.7  3.5 - 5.2 g/dL   AST 36  0 - 37 U/L   ALT 48  0 - 53 U/L   Alkaline Phosphatase 65  39 - 117 U/L   Total Bilirubin 1.1  0.3 - 1.2 mg/dL   GFR calc non Af Amer 78 (*) >90 mL/min   GFR calc Af Amer >90  >90 mL/min   Anion gap 13  5 - 15  PRO B NATRIURETIC PEPTIDE      Result Value Ref Range   Pro B Natriuretic peptide (BNP) 2668.0 (*) 0 - 450 pg/mL  I-STAT TROPOININ, ED      Result Value Ref Range   Troponin i, poc 0.08  0.00 - 0.08 ng/mL   Comment 3            Dg Chest 2 View  10/13/2013   CLINICAL DATA:  One week of weakness and dizziness and irregular heart rate. ; history of asthma  EXAM: CHEST  2 VIEW  COMPARISON:  PA and lateral chest x-Channing Yeager of December 27, 2006   FINDINGS: The lungs are well-expanded. The interstitial markings are increased bilaterally. The left hemi correction the hemidiaphragms are less distinct today. The cardiac silhouette is enlarged. The central pulmonary vascularity is prominent. No significant pleural fluid collections are demonstrated. There is mild degenerative disc disease at multiple thoracic levels.  IMPRESSION: Mild CHF superimposed upon reactive airway disease. There is no alveolar pneumonia.   Electronically Signed   By: David  Martinique   On: 10/13/2013 11:33    I performed a history and physical examination of [name] [name] and discussed  his management with Dr. Tamala Julian.  I agree with the history, physical, assessment, and plan of care, with the following exceptions: None  I was present for the following procedures: None Time Spent in Critical Care of the patient: None Time spent in discussions with the patient and family: [place], MD 10/14/13 [phone]

## 2013-10-21 DIAGNOSIS — D2370 Other benign neoplasm of skin of unspecified lower limb, including hip: Secondary | ICD-10-CM | POA: Diagnosis not present

## 2013-10-26 DIAGNOSIS — R079 Chest pain, unspecified: Secondary | ICD-10-CM | POA: Diagnosis not present

## 2013-10-26 DIAGNOSIS — I10 Essential (primary) hypertension: Secondary | ICD-10-CM | POA: Diagnosis not present

## 2013-10-26 DIAGNOSIS — I509 Heart failure, unspecified: Secondary | ICD-10-CM | POA: Diagnosis not present

## 2013-11-02 DIAGNOSIS — R079 Chest pain, unspecified: Secondary | ICD-10-CM | POA: Diagnosis not present

## 2013-11-09 DIAGNOSIS — I509 Heart failure, unspecified: Secondary | ICD-10-CM | POA: Diagnosis not present

## 2013-11-17 DIAGNOSIS — I428 Other cardiomyopathies: Secondary | ICD-10-CM | POA: Diagnosis not present

## 2013-11-17 DIAGNOSIS — I509 Heart failure, unspecified: Secondary | ICD-10-CM | POA: Diagnosis not present

## 2013-11-17 DIAGNOSIS — I10 Essential (primary) hypertension: Secondary | ICD-10-CM | POA: Diagnosis not present

## 2013-11-17 DIAGNOSIS — R079 Chest pain, unspecified: Secondary | ICD-10-CM | POA: Diagnosis not present

## 2013-11-19 DIAGNOSIS — R079 Chest pain, unspecified: Secondary | ICD-10-CM | POA: Diagnosis not present

## 2013-11-22 ENCOUNTER — Encounter (HOSPITAL_COMMUNITY): Payer: Self-pay

## 2013-11-25 ENCOUNTER — Ambulatory Visit (HOSPITAL_COMMUNITY)
Admission: RE | Admit: 2013-11-25 | Discharge: 2013-11-26 | Disposition: A | Discharge status: Home / Self Care | Payer: Medicare Other | Source: Ambulatory Visit | Attending: Cardiology | Admitting: Cardiology

## 2013-11-25 ENCOUNTER — Encounter (HOSPITAL_COMMUNITY)
Admission: RE | Disposition: A | Discharge status: Home / Self Care | Payer: Medicare Other | Source: Ambulatory Visit | Attending: Cardiology

## 2013-11-25 ENCOUNTER — Encounter (HOSPITAL_COMMUNITY): Payer: Self-pay | Admitting: General Practice

## 2013-11-25 DIAGNOSIS — I5042 Chronic combined systolic (congestive) and diastolic (congestive) heart failure: Secondary | ICD-10-CM | POA: Diagnosis not present

## 2013-11-25 DIAGNOSIS — E785 Hyperlipidemia, unspecified: Secondary | ICD-10-CM | POA: Insufficient documentation

## 2013-11-25 DIAGNOSIS — R9439 Abnormal result of other cardiovascular function study: Secondary | ICD-10-CM | POA: Diagnosis present

## 2013-11-25 DIAGNOSIS — I2584 Coronary atherosclerosis due to calcified coronary lesion: Secondary | ICD-10-CM | POA: Diagnosis not present

## 2013-11-25 DIAGNOSIS — R0789 Other chest pain: Secondary | ICD-10-CM | POA: Diagnosis not present

## 2013-11-25 DIAGNOSIS — I2510 Atherosclerotic heart disease of native coronary artery without angina pectoris: Secondary | ICD-10-CM | POA: Insufficient documentation

## 2013-11-25 DIAGNOSIS — I5020 Unspecified systolic (congestive) heart failure: Secondary | ICD-10-CM | POA: Diagnosis not present

## 2013-11-25 DIAGNOSIS — I10 Essential (primary) hypertension: Secondary | ICD-10-CM | POA: Insufficient documentation

## 2013-11-25 DIAGNOSIS — Z9861 Coronary angioplasty status: Secondary | ICD-10-CM

## 2013-11-25 HISTORY — DX: Pneumonia, unspecified organism: J18.9

## 2013-11-25 HISTORY — PX: PERCUTANEOUS CORONARY STENT INTERVENTION (PCI-S): SHX5485

## 2013-11-25 HISTORY — DX: Calculus of kidney: N20.0

## 2013-11-25 HISTORY — PX: LEFT HEART CATHETERIZATION WITH CORONARY ANGIOGRAM: SHX5451

## 2013-11-25 HISTORY — DX: Pure hypercholesterolemia, unspecified: E78.00

## 2013-11-25 HISTORY — DX: Heart failure, unspecified: I50.9

## 2013-11-25 HISTORY — DX: Unspecified chronic bronchitis: J42

## 2013-11-25 LAB — POCT ACTIVATED CLOTTING TIME
ACTIVATED CLOTTING TIME: 208 s
ACTIVATED CLOTTING TIME: 585 s

## 2013-11-25 LAB — GLUCOSE, CAPILLARY
GLUCOSE-CAPILLARY: 116 mg/dL — AB (ref 70–99)
GLUCOSE-CAPILLARY: 84 mg/dL (ref 70–99)
GLUCOSE-CAPILLARY: 117 mg/dL — AB (ref 70–99)
Glucose-Capillary: 105 mg/dL — ABNORMAL HIGH (ref 70–99)

## 2013-11-25 SURGERY — LEFT HEART CATHETERIZATION WITH CORONARY ANGIOGRAM
Anesthesia: LOCAL

## 2013-11-25 MED ORDER — BIVALIRUDIN 250 MG IV SOLR
INTRAVENOUS | Status: AC
  Filled 2013-11-25: qty 250

## 2013-11-25 MED ORDER — ACETAMINOPHEN 325 MG PO TABS: 650.0000 mg | ORAL_TABLET | ORAL | Status: DC | PRN

## 2013-11-25 MED ORDER — FUROSEMIDE 20 MG PO TABS
20.0000 mg | ORAL_TABLET | Freq: Two times a day (BID) | ORAL | Status: DC
  Administered 2013-11-25 – 2013-11-26 (×2): 20 mg via ORAL
  Filled 2013-11-25 (×4): qty 1

## 2013-11-25 MED ORDER — ASPIRIN 81 MG PO CHEW: 81.0000 mg | CHEWABLE_TABLET | ORAL | Status: DC

## 2013-11-25 MED ORDER — ASPIRIN EC 81 MG PO TBEC
81.0000 mg | DELAYED_RELEASE_TABLET | Freq: Every day | ORAL | Status: DC
  Administered 2013-11-26: 81 mg via ORAL
  Filled 2013-11-25: qty 1

## 2013-11-25 MED ORDER — METOPROLOL TARTRATE 12.5 MG HALF TABLET
12.5000 mg | ORAL_TABLET | Freq: Two times a day (BID) | ORAL | Status: DC
  Administered 2013-11-25 – 2013-11-26 (×2): 12.5 mg via ORAL
  Filled 2013-11-25 (×3): qty 1

## 2013-11-25 MED ORDER — NITROGLYCERIN 1 MG/10 ML FOR IR/CATH LAB
INTRA_ARTERIAL | Status: AC
  Filled 2013-11-25: qty 10

## 2013-11-25 MED ORDER — CLOPIDOGREL BISULFATE 300 MG PO TABS
ORAL_TABLET | ORAL | Status: AC
  Filled 2013-11-25: qty 2

## 2013-11-25 MED ORDER — SODIUM CHLORIDE 0.9 % IJ SOLN: 3.0000 mL | Freq: Two times a day (BID) | INTRAMUSCULAR | Status: DC

## 2013-11-25 MED ORDER — SODIUM CHLORIDE 0.9 % IJ SOLN: 3.0000 mL | INTRAMUSCULAR | Status: DC | PRN

## 2013-11-25 MED ORDER — IPRATROPIUM-ALBUTEROL 0.5-2.5 (3) MG/3ML IN SOLN
3.0000 mL | Freq: Four times a day (QID) | RESPIRATORY_TRACT | Status: DC | PRN
  Administered 2013-11-26: 06:00:00 3 mL via RESPIRATORY_TRACT
  Filled 2013-11-25: qty 3

## 2013-11-25 MED ORDER — HEPARIN (PORCINE) IN NACL 2-0.9 UNIT/ML-% IJ SOLN
INTRAMUSCULAR | Status: AC
  Filled 2013-11-25: qty 1500

## 2013-11-25 MED ORDER — IPRATROPIUM-ALBUTEROL 18-103 MCG/ACT IN AERO: 1.0000 | INHALATION_SPRAY | Freq: Four times a day (QID) | RESPIRATORY_TRACT | Status: DC | PRN

## 2013-11-25 MED ORDER — LISINOPRIL 5 MG PO TABS
5.0000 mg | ORAL_TABLET | Freq: Every day | ORAL | Status: DC
  Administered 2013-11-26: 11:00:00 5 mg via ORAL
  Filled 2013-11-25: qty 1

## 2013-11-25 MED ORDER — SODIUM CHLORIDE 0.9 % IV SOLN
INTRAVENOUS | Status: DC
  Administered 2013-11-25: 07:00:00 via INTRAVENOUS

## 2013-11-25 MED ORDER — SODIUM CHLORIDE 0.9 % IV SOLN: 250.0000 mL | INTRAVENOUS | Status: DC | PRN

## 2013-11-25 MED ORDER — VERAPAMIL HCL 2.5 MG/ML IV SOLN
INTRAVENOUS | Status: AC
  Filled 2013-11-25: qty 2

## 2013-11-25 MED ORDER — ONDANSETRON HCL 4 MG/2ML IJ SOLN: 4.0000 mg | Freq: Four times a day (QID) | INTRAMUSCULAR | Status: DC | PRN

## 2013-11-25 MED ORDER — LIDOCAINE HCL (PF) 1 % IJ SOLN
INTRAMUSCULAR | Status: AC
  Filled 2013-11-25: qty 30

## 2013-11-25 MED ORDER — SODIUM CHLORIDE 0.9 % IV SOLN: INTRAVENOUS | Status: AC

## 2013-11-25 MED ORDER — SODIUM CHLORIDE 0.9 % IV BOLUS (SEPSIS)
500.0000 mL | Freq: Once | INTRAVENOUS | Status: DC
  Administered 2013-11-25: 500 mL via INTRAVENOUS

## 2013-11-25 MED ORDER — CLOPIDOGREL BISULFATE 75 MG PO TABS
75.0000 mg | ORAL_TABLET | Freq: Every day | ORAL | Status: DC
  Administered 2013-11-26: 75 mg via ORAL
  Filled 2013-11-25: qty 1

## 2013-11-25 MED ORDER — HYDROMORPHONE HCL 1 MG/ML IJ SOLN
INTRAMUSCULAR | Status: AC
  Filled 2013-11-25: qty 1

## 2013-11-25 MED ORDER — MIDAZOLAM HCL 2 MG/2ML IJ SOLN
INTRAMUSCULAR | Status: AC
  Filled 2013-11-25: qty 2

## 2013-11-25 MED ORDER — HEPARIN SODIUM (PORCINE) 1000 UNIT/ML IJ SOLN
INTRAMUSCULAR | Status: AC
  Filled 2013-11-25: qty 1

## 2013-11-25 MED ORDER — ATORVASTATIN CALCIUM 20 MG PO TABS
20.0000 mg | ORAL_TABLET | Freq: Every day | ORAL | Status: DC
  Administered 2013-11-26: 20 mg via ORAL
  Filled 2013-11-25: qty 1

## 2013-11-25 MED ORDER — OXYCODONE-ACETAMINOPHEN 5-325 MG PO TABS
1.0000 | ORAL_TABLET | ORAL | Status: DC | PRN
  Administered 2013-11-25: 1 via ORAL
  Filled 2013-11-25: qty 1

## 2013-11-25 NOTE — Progress Notes (Signed)
TR BAND REMOVAL  LOCATION:    right radial  DEFLATED PER PROTOCOL:    Yes.    TIME BAND OFF / DRESSING APPLIED:    1300   SITE UPON ARRIVAL:    Level 0  SITE AFTER BAND REMOVAL:    Level 0  REVERSE ALLEN'S TEST:     positive  CIRCULATION SENSATION AND MOVEMENT:    Within Normal Limits   Yes.    COMMENTS:   Tolerated procedure well 

## 2013-11-25 NOTE — CV Procedure (Signed)
Procedure performed:  Left heart catheterization including hemodynamic monitoring of the left ventricle, LV gram. Selective right and left coronary arteriography. PTCA and stenting of the mid RCA with implantation of a 4.0 x 18 mm Xience Alpine DES.  Indication: Patient is a 80 year-old Caucasian male with history of hypertension,  hyperlipidemia, who presents with new onset of congestive heart failure, ejection fraction 20% by outpatient echocardiogram, outpatient nuclear stress test revealing anterior wall scar, inferior wall scar, ejection fraction markedly depressed, considered to be high risk. Patient also complained of chest pain suggestive of angina pectoris. Hence brought to the coronary angiography suite to evaluate his coronary anatomy.   Hemodynamic data: Left ventricular pressure was 98/3 with LVEDP of 11 mm mercury. Aortic pressure was 98/63 with a mean of 78 mm mercury. There was no pressure gradient across the aortic valve.   Left ventricle: Performed in the RAO projection revealed LVEF of 15-20% with severe global hypokinesis. Complete evaluation of the LV was not performed to conserve contrast.    Right coronary artery: Dominant. It is a large caliber vessel, gives origin to to PDA branches distally and trifurcates into a large PL branch. The right coronary artery is moderately calcified throughout. There is a high-grade 90% stenosis noted at the origin of the RV branch in the midsegment. Right coronary artery gives faint collaterals to the occluded LAD.  Left main coronary artery patent, calcified, distal left main shows a 10-40% stenosis. Left main trifurcates into LAD, large ramus intermediate and circumflex coronary artery.  Circumflex coronary artery: A large vessel giving origin to a moderate obtuse marginal 1. The mid circumflex coronary artery has a high-grade 90-95% stenosis prior to the bifurcation of the OM 1, followed by a tandem 99% stenosis just after the origin of the  OM 1. AV groove circumflex arises proximal to the stenosis also. There is TIMI 2 flow noted in the circumflex coronary artery, distal circumflex is relatively well-preserved. The ostium of the circumflex coronary artery has a 60-70% stenosis in some views in other views it appears to be 40-50% stenosed.  LAD:  LAD gives origin to a small to moderate sized diagonal-1.   Community has a 70-80% stenosis followed by total occlusion just after the origin of the one. The mid to distal segment of the LAD is severely calcified. Distal LAD has collaterals from the right coronary artery. Next  Ramus intermediate: Its large caliber vessel, there is proximal coronary calcification evident, proximal segment has any 20-30% stenosis followed by mild diffuse luminal irregularity. No high-grade stenosis evident.  Impression: Severe triple vessel coronary artery disease, patient has and to walk score, severely depressed ejection fraction, the risk of CABG outweights the benefits due to his age and scarred anterior  wall and markedly dilated LV. His right coronary artery is relatively well-preserved hence we'll proceed with PCI to the right coronary artery with plans on reevaluating and plan sorry was positioned of the midsegment of the circumflex coronary artery at a later date to contrast and to reduce periprocedural complication.  Interventional data: SuccessfulPTCA and stenting of the mid RCA with implantation of a 4.0 x 18 mm Xience Alpine DES.  Will need Dual antiplatelet therapy with Plavix and ASA 81 mg for at least one year, probably much longer.   Technique of diagnostic cardiac catheterization:  Under sterile precautions using a 6 French right radial  arterial access, a 6 French sheath was introduced into the right radial artery. A 5 [place] Tig 4 catheter  was advanced into the ascending aorta selective  right coronary artery and left coronary artery was cannulated and angiography was performed in multiple views.  The catheter was pulled back Out of the body over exchange length J-wire. Same Catheter was used to perform LV gram which was performed in RAO projection. Catheter exchanged out of the body over J-Wire. NO immediate complications noted.   Technique of intervention:  Using a 6 [place] Ikari 1.5 right guide catheter the right  coronary  was selected and cannulated. Using Angiomax for anticoagulation, I utilized a Runthrough guidewire and across the stenotic right  coronary artery without any difficulty. I placed the tip of the wire into the distal  coronary artery. Angiography was performed. Then I utilized a 2.5 x 12 mm trek  balloon , I performed balloon angioplasty at 14 pressure x 2 for 45 seconds each.  I proceeded with implantation of a 4.0 x 18 mm Xience  drug-eluting stent into the mid RCA. The stent was deployed at 12 atmospheric pressure for 50  seconds. The stent was then post dilated with a 4.0 x 12 mm [place] trek balloon at 16 atmospheric pressure for 45 seconds. Post-balloon angioplasty results were excellent with 0% residual stenoses and TIMI-3 flow was maintained. There was no evidence of edge dissection. The guidewire was withdrawn out of the body and the guide catheter was engaged and pulled out of the body over the J-wire the was no immediate complication. Patient tolerated the procedure well. Hemostasis achieved with TR band. Patient tolerated the procedure well.   Disposition: Patient will be discharged in morning unless complications with out-patient follow up. A total of 105 cc of contrast was utilized for diagnostic and interventional procedure.

## 2013-11-25 NOTE — Progress Notes (Signed)
12-lead EKG done. Waiting for 6500 bed assignment. Rt arm elevated on pillow. Instructions reviewed w/patient.

## 2013-11-25 NOTE — Interval H&P Note (Signed)
History and Physical Interval Note:  11/25/2013 7:46 AM  [name] [name]  has presented today for surgery, with the diagnosis of cp  The various methods of treatment have been discussed with the patient and family. After consideration of risks, benefits and other options for treatment, the patient has consented to  Procedure(s): LEFT HEART CATHETERIZATION WITH CORONARY ANGIOGRAM (N/A) and possible PCI as a surgical intervention .  The patient's history has been reviewed, patient examined, no change in status, stable for surgery.  I have reviewed the patient's chart and labs.  Questions were answered to the patient's satisfaction.   Cath Lab Visit (complete for each Cath Lab visit)  Clinical Evaluation Leading to the Procedure:   ACS: No.  Non-ACS:    Anginal Classification: CCS III  Anti-ischemic medical therapy: Minimal Therapy (1 class of medications)  Non-Invasive Test Results: High-risk stress test findings: cardiac mortality >3%/year  Prior CABG: No previous CABG        [facility]

## 2013-11-25 NOTE — H&P (Signed)
  Please see office visit notes for complete details of HPI.  

## 2013-11-25 NOTE — Progress Notes (Signed)
Ate [place] sandwich. Nephew in to visit.

## 2013-11-26 DIAGNOSIS — I5042 Chronic combined systolic (congestive) and diastolic (congestive) heart failure: Secondary | ICD-10-CM | POA: Diagnosis not present

## 2013-11-26 DIAGNOSIS — I10 Essential (primary) hypertension: Secondary | ICD-10-CM | POA: Diagnosis not present

## 2013-11-26 DIAGNOSIS — E785 Hyperlipidemia, unspecified: Secondary | ICD-10-CM | POA: Diagnosis not present

## 2013-11-26 DIAGNOSIS — I2510 Atherosclerotic heart disease of native coronary artery without angina pectoris: Secondary | ICD-10-CM | POA: Diagnosis not present

## 2013-11-26 LAB — CBC
HEMATOCRIT: 45.3 % (ref 39.0–52.0)
Hemoglobin: 15.6 g/dL (ref 13.0–17.0)
MCH: 30 pg (ref 26.0–34.0)
MCHC: 34.4 g/dL (ref 30.0–36.0)
MCV: 87.1 fL (ref 78.0–100.0)
Platelets: 137 10*3/uL — ABNORMAL LOW (ref 150–400)
RBC: 5.2 MIL/uL (ref 4.22–5.81)
RDW: 14.2 % (ref 11.5–15.5)
WBC: 5.2 10*3/uL (ref 4.0–10.5)

## 2013-11-26 LAB — BASIC METABOLIC PANEL
ANION GAP: 12 (ref 5–15)
BUN: 12 mg/dL (ref 6–23)
CHLORIDE: 101 meq/L (ref 96–112)
CO2: 26 meq/L (ref 19–32)
CREATININE: 0.93 mg/dL (ref 0.50–1.35)
Calcium: 8.7 mg/dL (ref 8.4–10.5)
GFR calc non Af Amer: 76 mL/min — ABNORMAL LOW (ref >90)
GFR, EST AFRICAN AMERICAN: 88 mL/min — AB (ref >90)
Glucose, Bld: 111 mg/dL — ABNORMAL HIGH (ref 70–99)
POTASSIUM: 4.1 meq/L (ref 3.7–5.3)
Sodium: 139 mEq/L (ref 137–147)

## 2013-11-26 LAB — GLUCOSE, CAPILLARY: Glucose-Capillary: 101 mg/dL — ABNORMAL HIGH (ref 70–99)

## 2013-11-26 MED ORDER — CLOPIDOGREL BISULFATE 75 MG PO TABS: 75.0000 mg | ORAL_TABLET | Freq: Every day | ORAL | Status: AC

## 2013-11-26 MED FILL — Sodium Chloride IV Soln 0.9%: INTRAVENOUS | Qty: 50 | Status: AC

## 2013-11-26 NOTE — Progress Notes (Signed)
CARDIAC REHAB PHASE I   PRE:  Rate/Rhythm: 105 ST  BP:  Supine:   Sitting: 149/74  Standing:    SaO2: 94 RA  MODE:  Ambulation: 225 ft   POST:  Rate/Rhythm:   BP:  Supine:   Sitting: 144/80  Standing:    SaO2: 94 RA [id number] On arrival pt in recliner, states that he has taken a couple of walks this morning using a quad cane. Pt states that he has a quad cane at home,but that his cane the legs are closer together. I used walker for him to ambulate.Gait steady with walker. He was able to walk 225 feet without c/o of cp or SOB. VS stable. Pt admits that the walker was more support than the cane was. He states that he does not want to use the walker and he feels that he will be save walking with his cane at home. I completed stent and CHF education with pt. He voices understanding. I encouraged his to do some walking at home. He is not appropriate for Outpt. CRP due to needing a another procedure.  Rodney Langton RN 11/26/2013 9:12 AM

## 2013-11-27 NOTE — Discharge Summary (Signed)
Physician Discharge Summary  Patient ID: [name] [name] MRN: [id number] DOB/AGE: 1928/12/24 80 y.o.  Admit date: 11/25/2013 Discharge date: 11/26/2013  Primary Discharge Diagnosis Ischemic cardiomyopathy CAD of the native vessels s/p  Stenting of the mid RCA with implantation of a 4.0 x 18 mm Xience Alpine DES on 11/25/2013. Has residual mid Cx disease and will be scheduled for staged PCI. Secondary Discharge Diagnosis Chronic systolic and diastolic heart failure Hypertension Bronchial asthma Hyperlipidemia  Significant Diagnostic Studies: Coronary angiogram 11/25/2013:  Hemodynamic data:  Left ventricular pressure was 98/3 with LVEDP of 11 mm mercury. Aortic pressure was 98/63 with a mean of 78 mm mercury. There was no pressure gradient across the aortic valve.  Left ventricle: Performed in the RAO projection revealed LVEF of 15-20% with severe global hypokinesis. Complete evaluation of the LV was not performed to conserve contrast.  Right coronary artery: Dominant. It is a large caliber vessel, gives origin to to PDA branches distally and trifurcates into a large PL branch. The right coronary artery is moderately calcified throughout. There is a high-grade 90% stenosis noted at the origin of the RV branch in the midsegment. Right coronary artery gives faint collaterals to the occluded LAD.  Left main coronary artery patent, calcified, distal left main shows a 10-40% stenosis. Left main trifurcates into LAD, large ramus intermediate and circumflex coronary artery.  Circumflex coronary artery: A large vessel giving origin to a moderate obtuse marginal 1. The mid circumflex coronary artery has a high-grade 90-95% stenosis prior to the bifurcation of the OM 1, followed by a tandem 99% stenosis just after the origin of the OM 1. AV groove circumflex arises proximal to the stenosis also. There is TIMI 2 flow noted in the circumflex coronary artery, distal circumflex is relatively well-preserved. The  ostium of the circumflex coronary artery has a 60-70% stenosis in some views in other views it appears to be 40-50% stenosed.  LAD: LAD gives origin to a small to moderate sized diagonal-1. Community has a 70-80% stenosis followed by total occlusion just after the origin of the one. The mid to distal segment of the LAD is severely calcified. Distal LAD has collaterals from the right coronary artery. Next  Ramus intermediate: Its large caliber vessel, there is proximal coronary calcification evident, proximal segment has any 20-30% stenosis followed by mild diffuse luminal irregularity. No high-grade stenosis evident.  Impression: Severe triple vessel coronary artery disease, patient has and to walk score, severely depressed ejection fraction, the risk of CABG outweights the benefits due to his age and scarred anterior wall and markedly dilated LV. His right coronary artery is relatively well-preserved hence we'll proceed with PCI to the right coronary artery with plans on reevaluating and plan sorry was positioned of the midsegment of the circumflex coronary artery at a later date to contrast and to reduce periprocedural complication.  Interventional data: Successful PTCA and stenting of the mid RCA with implantation of a 4.0 x 18 mm Xience Alpine DES. Will need Dual antiplatelet therapy with Plavix and ASA 81 mg for at least 1 year probably longer.  Hospital Course: Patient is a 80 year-old Caucasian male with history of hypertension, hyperlipidemia, who presents with new onset of congestive heart failure, ejection fraction 20% by outpatient echocardiogram, outpatient nuclear stress test revealing anterior wall scar, inferior wall scar, ejection fraction markedly depressed, considered to be high risk. Patient also complained of chest pain suggestive of angina pectoris. Hence brought to the coronary angiography suite to evaluate his coronary  anatomy.    Patient underwent uncomplicated stenting to a large RCA  and will be staged for PCI Cx at a later date. He felt fine and stated that since procedure his chest felt light and he was able to breathe better.   Recommendations on discharge: patient has been started on clopidogrel 75 mg by mouth daily. Platelet count reduced, willl f/u on this.  Discharge Exam: Blood pressure 149/74, pulse 104, temperature 98.4 F (36.9 C), temperature source Oral, resp. rate 18, height 5\' 11"  (1.803 m), weight 99.8 kg (220 lb 0.3 oz), SpO2 98.00%.    General appearance: alert, cooperative, appears stated age, no distress and mildly obese Resp: clear to auscultation bilaterally and prolonged expiration noted Cardio: regular rate and rhythm, S1, S2 normal, no murmur, click, rub or gallop GI: soft, non-tender; bowel sounds normal; no masses,  no organomegaly Extremities: extremities normal, atraumatic, no cyanosis or edema Pulses: 2+ and symmetric Neurologic: Grossly normal Right radial access site has healed well.  Labs:   Lab Results  Component Value Date   WBC 5.2 11/26/2013   HGB 15.6 11/26/2013   HCT 45.3 11/26/2013   MCV 87.1 11/26/2013   PLT 137* 11/26/2013    Recent Labs Lab 11/26/13 0308  NA 139  K 4.1  CL 101  CO2 26  BUN 12  CREATININE 0.93  CALCIUM 8.7  GLUCOSE 111*   No results found for this basename: CKTOTAL, CKMB, CKMBINDEX, TROPONINI    Lipid Panel     Component Value Date/Time   CHOL 174 10/14/2006 0824   TRIG 98 10/14/2006 0824   HDL 19.4* 10/14/2006 0824   CHOLHDL 9.0 CALC 10/14/2006 0824   VLDL 20 10/14/2006 0824   LDLCALC 135* 10/14/2006 0824    EKG: 11/26/2013: Normal sinus rhythm, left atrial enlargement, inferior infarct, anterior infarct old.  No evidence of ischemia.  No significant change compared to prior EKG.   Radiology: No results found.    FOLLOW UP PLANS AND APPOINTMENTS    Medication List         albuterol-ipratropium 18-103 MCG/ACT inhaler  Commonly known as:  COMBIVENT  Inhale into the lungs every 4  (four) hours.     aspirin EC 81 MG tablet  Take 81 mg by mouth daily.     atorvastatin 20 MG tablet  Commonly known as:  LIPITOR  Take 20 mg by mouth daily.     clopidogrel 75 MG tablet  Commonly known as:  PLAVIX  Take 1 tablet (75 mg total) by mouth daily with breakfast.     furosemide 20 MG tablet  Commonly known as:  LASIX  Take 20 mg by mouth 2 (two) times daily.     lisinopril 5 MG tablet  Commonly known as:  PRINIVIL,ZESTRIL  Take 5 mg by mouth daily.     metoprolol tartrate 25 MG tablet  Commonly known as:  LOPRESSOR  Take 12.5 mg by mouth 2 (two) times daily.     OVER THE COUNTER MEDICATION  Take 2 tablets by mouth every 4 (four) hours as needed (for shortness of breath, Lung Clear).           Follow-up Information   Follow up with Woody Seller Sylvan Cheese, MD. (Keep previous appointment)    Specialty:  Cardiology   Contact information:   [address] [place] [address] [phone]        Laverda Page, MD 11/27/2013, 3:14 PM  Pager: [phone] Office: [phone] If no answer:  [phone]   

## 2013-12-06 DIAGNOSIS — I509 Heart failure, unspecified: Secondary | ICD-10-CM | POA: Diagnosis not present

## 2013-12-06 DIAGNOSIS — I10 Essential (primary) hypertension: Secondary | ICD-10-CM | POA: Diagnosis not present

## 2013-12-06 DIAGNOSIS — I2510 Atherosclerotic heart disease of native coronary artery without angina pectoris: Secondary | ICD-10-CM | POA: Diagnosis not present

## 2013-12-06 DIAGNOSIS — I420 Dilated cardiomyopathy: Secondary | ICD-10-CM | POA: Diagnosis not present

## 2013-12-17 ENCOUNTER — Encounter (HOSPITAL_COMMUNITY): Payer: Self-pay | Admitting: Pharmacy Technician

## 2013-12-23 DIAGNOSIS — R079 Chest pain, unspecified: Secondary | ICD-10-CM | POA: Diagnosis not present

## 2013-12-26 DIAGNOSIS — I2510 Atherosclerotic heart disease of native coronary artery without angina pectoris: Secondary | ICD-10-CM | POA: Diagnosis present

## 2013-12-28 ENCOUNTER — Ambulatory Visit (HOSPITAL_COMMUNITY)
Admission: RE | Admit: 2013-12-28 | Discharge: 2013-12-29 | Disposition: A | Discharge status: Home / Self Care | Payer: Medicare Other | Source: Ambulatory Visit | Attending: Cardiology | Admitting: Cardiology

## 2013-12-28 ENCOUNTER — Encounter (HOSPITAL_COMMUNITY): Payer: Self-pay | Admitting: General Practice

## 2013-12-28 ENCOUNTER — Encounter (HOSPITAL_COMMUNITY)
Admission: RE | Disposition: A | Discharge status: Home / Self Care | Payer: Medicare Other | Source: Ambulatory Visit | Attending: Cardiology

## 2013-12-28 DIAGNOSIS — I10 Essential (primary) hypertension: Secondary | ICD-10-CM | POA: Diagnosis not present

## 2013-12-28 DIAGNOSIS — J45909 Unspecified asthma, uncomplicated: Secondary | ICD-10-CM | POA: Diagnosis not present

## 2013-12-28 DIAGNOSIS — E785 Hyperlipidemia, unspecified: Secondary | ICD-10-CM | POA: Diagnosis not present

## 2013-12-28 DIAGNOSIS — I25110 Atherosclerotic heart disease of native coronary artery with unstable angina pectoris: Secondary | ICD-10-CM | POA: Diagnosis not present

## 2013-12-28 DIAGNOSIS — I255 Ischemic cardiomyopathy: Secondary | ICD-10-CM | POA: Insufficient documentation

## 2013-12-28 DIAGNOSIS — I5042 Chronic combined systolic (congestive) and diastolic (congestive) heart failure: Secondary | ICD-10-CM | POA: Diagnosis not present

## 2013-12-28 DIAGNOSIS — I2510 Atherosclerotic heart disease of native coronary artery without angina pectoris: Secondary | ICD-10-CM | POA: Insufficient documentation

## 2013-12-28 DIAGNOSIS — I2584 Coronary atherosclerosis due to calcified coronary lesion: Secondary | ICD-10-CM | POA: Insufficient documentation

## 2013-12-28 DIAGNOSIS — Z955 Presence of coronary angioplasty implant and graft: Secondary | ICD-10-CM | POA: Diagnosis not present

## 2013-12-28 DIAGNOSIS — Z9861 Coronary angioplasty status: Secondary | ICD-10-CM

## 2013-12-28 HISTORY — DX: Atherosclerotic heart disease of native coronary artery without angina pectoris: I25.10

## 2013-12-28 HISTORY — PX: PERCUTANEOUS CORONARY STENT INTERVENTION (PCI-S): SHX5485

## 2013-12-28 LAB — POCT ACTIVATED CLOTTING TIME: Activated Clotting Time: 388 seconds

## 2013-12-28 SURGERY — PERCUTANEOUS CORONARY STENT INTERVENTION (PCI-S)
Anesthesia: LOCAL

## 2013-12-28 MED ORDER — LIDOCAINE HCL (PF) 1 % IJ SOLN
INTRAMUSCULAR | Status: AC
  Filled 2013-12-28: qty 30

## 2013-12-28 MED ORDER — SODIUM CHLORIDE 0.9 % IV SOLN: 250.0000 mL | INTRAVENOUS | Status: DC | PRN

## 2013-12-28 MED ORDER — MIDAZOLAM HCL 2 MG/2ML IJ SOLN
INTRAMUSCULAR | Status: AC
  Filled 2013-12-28: qty 2

## 2013-12-28 MED ORDER — VERAPAMIL HCL 2.5 MG/ML IV SOLN
INTRAVENOUS | Status: AC
  Filled 2013-12-28: qty 2

## 2013-12-28 MED ORDER — CLOPIDOGREL BISULFATE 75 MG PO TABS
75.0000 mg | ORAL_TABLET | Freq: Every day | ORAL | Status: DC
  Administered 2013-12-29: 09:00:00 75 mg via ORAL
  Filled 2013-12-28: qty 1

## 2013-12-28 MED ORDER — ATORVASTATIN CALCIUM 20 MG PO TABS
20.0000 mg | ORAL_TABLET | Freq: Every day | ORAL | Status: DC
  Administered 2013-12-29: 20 mg via ORAL
  Filled 2013-12-28: qty 1

## 2013-12-28 MED ORDER — LISINOPRIL 5 MG PO TABS
5.0000 mg | ORAL_TABLET | Freq: Every day | ORAL | Status: DC
  Administered 2013-12-29: 5 mg via ORAL
  Filled 2013-12-28: qty 1

## 2013-12-28 MED ORDER — FUROSEMIDE 20 MG PO TABS
20.0000 mg | ORAL_TABLET | Freq: Two times a day (BID) | ORAL | Status: DC
  Administered 2013-12-28 – 2013-12-29 (×2): 20 mg via ORAL
  Filled 2013-12-28 (×4): qty 1

## 2013-12-28 MED ORDER — NITROGLYCERIN 1 MG/10 ML FOR IR/CATH LAB
INTRA_ARTERIAL | Status: AC
  Filled 2013-12-28: qty 10

## 2013-12-28 MED ORDER — SODIUM CHLORIDE 0.9 % IJ SOLN: 3.0000 mL | Freq: Two times a day (BID) | INTRAMUSCULAR | Status: DC

## 2013-12-28 MED ORDER — SODIUM CHLORIDE 0.9 % IV SOLN
1.7500 mg/kg/h | INTRAVENOUS | Status: AC
  Administered 2013-12-28: 1.75 mg/kg/h via INTRAVENOUS
  Filled 2013-12-28: qty 250

## 2013-12-28 MED ORDER — HEPARIN (PORCINE) IN NACL 2-0.9 UNIT/ML-% IJ SOLN
INTRAMUSCULAR | Status: AC
  Filled 2013-12-28: qty 1000

## 2013-12-28 MED ORDER — FAMOTIDINE IN NACL 20-0.9 MG/50ML-% IV SOLN
INTRAVENOUS | Status: AC
  Filled 2013-12-28: qty 50

## 2013-12-28 MED ORDER — SODIUM CHLORIDE 0.9 % IJ SOLN
3.0000 mL | INTRAMUSCULAR | Status: DC | PRN
Start: 2013-12-28 — End: 2013-12-28

## 2013-12-28 MED ORDER — DM-GUAIFENESIN ER 30-600 MG PO TB12
1.0000 | ORAL_TABLET | Freq: Two times a day (BID) | ORAL | Status: DC
  Administered 2013-12-28 (×2): 1 via ORAL
  Filled 2013-12-28 (×4): qty 1

## 2013-12-28 MED ORDER — SODIUM CHLORIDE 0.9 % IV SOLN
INTRAVENOUS | Status: DC
  Administered 2013-12-28: 500 mL via INTRAVENOUS

## 2013-12-28 MED ORDER — ASPIRIN 81 MG PO CHEW: 81.0000 mg | CHEWABLE_TABLET | ORAL | Status: DC

## 2013-12-28 MED ORDER — ONDANSETRON HCL 4 MG/2ML IJ SOLN: 4.0000 mg | Freq: Four times a day (QID) | INTRAMUSCULAR | Status: DC | PRN

## 2013-12-28 MED ORDER — BIVALIRUDIN 250 MG IV SOLR
INTRAVENOUS | Status: AC
  Filled 2013-12-28: qty 250

## 2013-12-28 MED ORDER — FENTANYL CITRATE 0.05 MG/ML IJ SOLN
INTRAMUSCULAR | Status: AC
  Filled 2013-12-28: qty 2

## 2013-12-28 MED ORDER — ASPIRIN EC 81 MG PO TBEC
81.0000 mg | DELAYED_RELEASE_TABLET | Freq: Every day | ORAL | Status: DC
  Administered 2013-12-29: 81 mg via ORAL
  Filled 2013-12-28: qty 1

## 2013-12-28 MED ORDER — OXYCODONE-ACETAMINOPHEN 5-325 MG PO TABS
1.0000 | ORAL_TABLET | ORAL | Status: DC | PRN
  Administered 2013-12-28 (×2): 1 via ORAL
  Filled 2013-12-28 (×2): qty 1

## 2013-12-28 MED ORDER — SODIUM CHLORIDE 0.9 % IJ SOLN
3.0000 mL | INTRAMUSCULAR | Status: DC | PRN
  Administered 2013-12-28: 3 mL via INTRAVENOUS
  Filled 2013-12-28: qty 3

## 2013-12-28 MED ORDER — SODIUM CHLORIDE 0.9 % IV SOLN
INTRAVENOUS | Status: DC
  Administered 2013-12-28: 10:00:00 via INTRAVENOUS

## 2013-12-28 MED ORDER — IPRATROPIUM-ALBUTEROL 0.5-2.5 (3) MG/3ML IN SOLN: 3.0000 mL | Freq: Four times a day (QID) | RESPIRATORY_TRACT | Status: DC | PRN

## 2013-12-28 MED ORDER — METOPROLOL TARTRATE 25 MG PO TABS
25.0000 mg | ORAL_TABLET | Freq: Two times a day (BID) | ORAL | Status: DC
  Administered 2013-12-28 – 2013-12-29 (×2): 25 mg via ORAL
  Filled 2013-12-28 (×3): qty 1

## 2013-12-28 MED ORDER — ACETAMINOPHEN 325 MG PO TABS
650.0000 mg | ORAL_TABLET | ORAL | Status: DC | PRN
  Administered 2013-12-28: 11:00:00 650 mg via ORAL
  Filled 2013-12-28: qty 2

## 2013-12-28 MED ORDER — IPRATROPIUM-ALBUTEROL 0.5-2.5 (3) MG/3ML IN SOLN: 3.0000 mL | RESPIRATORY_TRACT | Status: DC

## 2013-12-28 NOTE — Plan of Care (Signed)
Problem: Consults Goal: Cardiac Cath Patient Education (See Patient Education module for education specifics.) Outcome: Completed/Met Date Met:  12/28/13 Goal: Skin Care Protocol Initiated - if Braden Score 18 or less If consults are not indicated, leave blank or document N/A Outcome: Completed/Met Date Met:  12/28/13  Problem: Phase I Progression Outcomes Goal: Pain controlled with appropriate interventions Outcome: Completed/Met Date Met:  12/28/13 Goal: Voiding-avoid urinary catheter unless indicated Outcome: Completed/Met Date Met:  12/28/13 Goal: Hemodynamically stable Outcome: Completed/Met Date Met:  12/28/13 Goal: Distal pulses equal to baseline Outcome: Completed/Met Date Met:  12/28/13 Goal: Vascular site scale level 0 - I Vascular Site Scale Level 0: No bruising/bleeding/hematoma Level I (Mild): Bruising/Ecchymosis, minimal bleeding/ooozing, palpable hematoma < 3 cm Level II (Moderate): Bleeding not affecting hemodynamic parameters, pseudoaneurysm, palpable hematoma > 3 cm Level III (Severe) Bleeding which affects hemodynamic parameters or retroperitoneal hemorrhage  Outcome: Completed/Met Date Met:  12/28/13 Goal: Post Cath/PCI return to appropriate Path Outcome: Completed/Met Date Met:  12/28/13

## 2013-12-28 NOTE — H&P (Signed)
  Please see office visit notes for complete details of HPI.  

## 2013-12-28 NOTE — Interval H&P Note (Signed)
History and Physical Interval Note:  12/28/2013 7:46 AM  [name] [name]  has presented today for surgery, with the diagnosis of cp  The various methods of treatment have been discussed with the patient and family. After consideration of risks, benefits and other options for treatment, the patient has consented to  Procedure(s): PERCUTANEOUS CORONARY STENT INTERVENTION (PCI-S) (N/A) and PCI  as a surgical intervention .  The patient's history has been reviewed, patient examined, no change in status, stable for surgery.  I have reviewed the patient's chart and labs.  Questions were answered to the patient's satisfaction.   Cath Lab Visit (complete for each Cath Lab visit)  Clinical Evaluation Leading to the Procedure:   ACS: No.  Non-ACS:    Anginal Classification: CCS III  Anti-ischemic medical therapy: Minimal Therapy (1 class of medications)  Non-Invasive Test Results: High-risk stress test findings: cardiac mortality >3%/year  Prior CABG: No previous CABG        [facility]

## 2013-12-28 NOTE — CV Procedure (Signed)
Procedure performed:  Selective left coronary arteriography. PTCA and stenting of the mid to distal and proximal circumflex coronary artery with implantation of 2 overlapping 2.5 x 26 mm and a 2.5 x 18 mm resolute integrity DES.  Indication: Patient is a 80 year-old Caucasian male with history of ischemic cardiomyopathy with severe LV systolic dysfunction, he has undergone successful PTCA to a very large right coronary artery on 11/25/2013 with implantation of a 4.0 x 18 mm Xience DES. He had residual high-grade 90-95% stenosis in the proximal segment and subtotally occluded midsegment of a large circumflex coronary artery. Ostium of the circumflex had a 40-50% stenosis. These were calcified vessels. He was then brought for elective angioplasty due to high risk stress test. He had initially presented with congestive heart failure this was a planned intervention to the circumflex coronary artery.  Hemodynamic data: Aortic pressure was 128/74 with a mean of 98 mm mercury.    Brief Angiographic data: Left main coronary artery is mildly calcified with a eccentric shelflike lesion in the superior aspect constituting 30-40% stenosis. LAD is heavily calcified, with a 70% stenosis in the proximal segment followed by total occlusion which is old. Ramus intermediate is a large vessel with mild to moderate diffuse disease and calcification without high-grade stenosis.  Circumflex coronary artery: A  large vessel and is subtotally occluded with TIMI 1 flow in the distal segment. Ostium has a 40-50% stenosis in some views appears to be 60%. Proximal segment has a 95% stenosis followed by a subtotal occlusion in the mid to distal segment.  Interventional data: PTCA and stenting of the mid to distal and proximal circumflex coronary artery with implantation of 2 overlapping 2.5 x 26 mm and a 2.5 x 18 mm resolute integrity DES. Patient will be continued on aspirin and Plavix, probably long-term.   Technique of  intervention:  Using a 6 [place] EBU 3.0 guide catheter theleft main  coronary  was selected and cannulated. A 0.035" x 190 cm glide wire had to be utilized to acquire ascending aortic position. Using Angiomax for anticoagulation, I utilized a Transport planner XTguidewire and across the circumflex coronary artery coronary artery with significant amount of difficulty due to subtotal occlusion calcification and acute angulation. I utilized a 2.5 x 6 mm balloon initially to cross the stenosis and placed the wire distally, however the balloon would not cross the mid to distal segment. Hence the balloon was never inflated, was pulled out of the body. Initial plan was to proceed with atherectomy due to calcification.  I then utilized a 1.5 x 6 mm Euphora, with the help of this backup support, with significant amount of difficulty and was able to cross the mid to distal segment subtotal occlusion. With the passage of the guidewire, there was only TIMI 0-1 flow suggesting high-grade stenosis.  Multiple balloon inflations throughout the mid to distal segment and proximal circumflex: Artery was performed at 14 atmospheric pressure for 30 seconds 9. I then utilized a 2.5 x 25 mm Euphora.  After performing balloon Angioplasty, I placed the tip of the wire into the distal  coronary artery. Angiography was performed. Then I attempted to stent the lesions with a 2.5 x 26 mm resolute stent, I was unable to cross the stent from the proximal segment. Then I utilized a guideliner, which was advanced over the 0.5 x 6 mm balloon and a placed the guideliner catheter into the mouth of the circumflex coronary artery. With the support of this which was performed over  the balloon, I then advanced the 2.5 x 26 mm resolute DES and with great amount of difficulty and was able to place the stent in the mid to distal segment of the circumflex coronary artery and deployed the stent at 10 atmospheric pressure for 50 seconds. The same stent balloon  was gently withdrawn proximally and a second inflation 10 atmospheric pressure for 30 seconds was performed. This was followed by withdrawal of the stent balloon and insertion of a 2.5 x 18 mm resolute DES and the stent was deployed in the proximal to midsegment circumflex overlapping the previously placed stent and sparing the ostium to avoid any stent jailing of a large ramus intermediate. The stent was deployed at 12 atmospheric pressure for 50 seconds. This was followed by post dilatation with the same stent balloon throughout the stented segment at 12 atmospheric pressure for 30 seconds each 2. Following this intracoronary nitroglycerin was administered and angiography was performed. The guide liner and the stent balloon was withdrawn out of the body, angiography repeated, guidewire withdrawn and angiography repeated and the results of success was confirmed in multiple views.   Patient tolerated the procedure well without any complications. While applying hemostasis band, there was failure of the band leading to bleeding, however this was easily controlled. Eventually adequate hemostasis was obtained. Next  A total of 190 mL of contrast was utilized for diagnostic and interventional procedure.

## 2013-12-28 NOTE — Progress Notes (Signed)
TR BAND REMOVAL  LOCATION:  right radial  DEFLATED PER PROTOCOL:  Yes.    TIME BAND OFF / DRESSING APPLIED:   1315   SITE UPON ARRIVAL:   Level 1  SITE AFTER BAND REMOVAL:  Level 1  REVERSE ALLEN'S TEST:    positive  CIRCULATION SENSATION AND MOVEMENT:  Within Normal Limits  Yes.    COMMENTS:  Hematoma post procedure; arrived with 7 cm X 2.5 cm proximal to band.  Painful; pt. Medicated; arm maintained elevated on two pillows; ice applied.

## 2013-12-29 DIAGNOSIS — Z955 Presence of coronary angioplasty implant and graft: Secondary | ICD-10-CM | POA: Diagnosis not present

## 2013-12-29 DIAGNOSIS — I255 Ischemic cardiomyopathy: Secondary | ICD-10-CM | POA: Diagnosis not present

## 2013-12-29 DIAGNOSIS — I2584 Coronary atherosclerosis due to calcified coronary lesion: Secondary | ICD-10-CM | POA: Diagnosis not present

## 2013-12-29 DIAGNOSIS — I2510 Atherosclerotic heart disease of native coronary artery without angina pectoris: Secondary | ICD-10-CM | POA: Diagnosis not present

## 2013-12-29 LAB — BASIC METABOLIC PANEL
Anion gap: 12 (ref 5–15)
BUN: 13 mg/dL (ref 6–23)
CO2: 26 mEq/L (ref 19–32)
Calcium: 8.7 mg/dL (ref 8.4–10.5)
Chloride: 103 mEq/L (ref 96–112)
Creatinine, Ser: 0.92 mg/dL (ref 0.50–1.35)
GFR calc Af Amer: 88 mL/min — ABNORMAL LOW (ref >90)
GFR calc non Af Amer: 76 mL/min — ABNORMAL LOW (ref >90)
GLUCOSE: 113 mg/dL — AB (ref 70–99)
Potassium: 4.2 mEq/L (ref 3.7–5.3)
Sodium: 141 mEq/L (ref 137–147)

## 2013-12-29 LAB — CBC
HCT: 43.1 % (ref 39.0–52.0)
Hemoglobin: 14.5 g/dL (ref 13.0–17.0)
MCH: 29.5 pg (ref 26.0–34.0)
MCHC: 33.6 g/dL (ref 30.0–36.0)
MCV: 87.8 fL (ref 78.0–100.0)
PLATELETS: 138 10*3/uL — AB (ref 150–400)
RBC: 4.91 MIL/uL (ref 4.22–5.81)
RDW: 14.4 % (ref 11.5–15.5)
WBC: 5.2 10*3/uL (ref 4.0–10.5)

## 2013-12-29 MED ORDER — METOPROLOL TARTRATE 25 MG PO TABS: 25.0000 mg | ORAL_TABLET | Freq: Two times a day (BID) | ORAL | Status: DC

## 2013-12-29 MED FILL — Sodium Chloride IV Soln 0.9%: INTRAVENOUS | Qty: 50 | Status: AC

## 2013-12-29 NOTE — Progress Notes (Signed)
CARDIAC REHAB PHASE I   PRE:  Rate/Rhythm: 85 SR  BP:  Sitting: 143/65     SaO2: 94 RA  MODE:  Ambulation: 500 ft   POST:  Rate/Rhythm: 98 SR  BP:  Sitting: 168/63    SaO2: 95 RA  Pt walked 551ft with RW and assist x1 with no c/o.  Pt normally uses cane for ambulation, but felt comfortable with RW due to being sedentary for a day.  Reviewed education with pt and stressed dietary changes in regards to sodium and eating out.  Pt stated he is not interested in participating in CRP II at this time.   Lillia Dallas MS, ACSM RCEP 10:12 AM 12/29/2013

## 2013-12-29 NOTE — Discharge Summary (Signed)
Physician Discharge Summary  Patient ID: [name] [name] MRN: [id number] DOB/AGE: 05/07/1928 80 y.o.  Admit date: 12/28/2013 Discharge date: 12/29/2013  Ischemic cardiomyopathy with severe LV systolic dysfunction CAD of the native vessels s/p Stenting on 11/25/2013 to mid RCA 4.0 x 18 mm Xience Alpine DES .  Stenting on 12/28/2013 to mid to distal and proximal circumflex coronary artery with 2 overlapping 2.5 x 26 mm and a 2.5 x 18 mm resolute integrity DES. Secondary Discharge Diagnosis Chronic systolic and diastolic heart failure Hypertension Bronchial asthma Hyperlipidemia  Significant Diagnostic Studies: 12/28/2013: Hemodynamic data: Aortic pressure was 128/74 with a mean of 98 mm mercury.   Brief Angiographic data: Left main coronary artery is mildly calcified with a eccentric shelflike lesion in the superior aspect constituting 30-40% stenosis. LAD is heavily calcified, with a 70% stenosis in the proximal segment followed by total occlusion which is old. Ramus intermediate is a large vessel with mild to moderate diffuse disease and calcification without high-grade stenosis.  Circumflex coronary artery: A large vessel and is subtotally occluded with TIMI 1 flow in the distal segment. Ostium has a 40-50% stenosis in some views appears to be 60%. Proximal segment has a 95% stenosis followed by a subtotal occlusion in the mid to distal segment.  Interventional data: PTCA and stenting of the mid to distal and proximal circumflex coronary artery with implantation of 2 overlapping 2.5 x 26 mm and a 2.5 x 18 mm resolute integrity DES. Patient will be continued on aspirin and Plavix, probably long-term.   Hospital Course: Patient is a 80 year-old Caucasian male with history of ischemic cardiomyopathy with severe LV systolic dysfunction, he has undergone successful PTCA to a very large right coronary artery on 11/25/2013 with implantation of a 4.0 x 18 mm Xience DES. He had residual high-grade  90-95% stenosis in the proximal segment and subtotally occluded midsegment of a large circumflex coronary artery. Ostium of the circumflex had a 40-50% stenosis. These were calcified vessels. He was then brought for elective angioplasty due to high risk stress test. He had initially presented with congestive heart failure this was a planned intervention to the circumflex coronary artery.  Patient underwent successful revascularization and hence admitted for overnight observation due to complexity of his coronary anatomy.  He did well postprocedure without any recurrence of chest pain or shortness of breath and labs also remained stable.  Hence was felt stable for discharge.   Blood pressure 159/75, pulse 82, temperature 97.5 F (36.4 C), temperature source Oral, resp. rate 17, height 5\' 10"  (1.778 m), weight 98.5 kg (217 lb 2.5 oz), SpO2 96 %. General appearance: alert, cooperative, appears stated age, no distress and mildly obese Resp: clear to auscultation bilaterally and prolonged expiration noted Cardio: regular rate and rhythm, S1, S2 normal, no murmur, click, rub or gallop GI: soft, non-tender; bowel sounds normal; no masses, no organomegaly Extremities: extremities normal, atraumatic, no cyanosis or edema Pulses: 2+ and symmetric Neurologic: Grossly normal Right radial access site has healed well.  Labs:   Lab Results  Component Value Date   WBC 5.2 12/29/2013   HGB 14.5 12/29/2013   HCT 43.1 12/29/2013   MCV 87.8 12/29/2013   PLT 138* 12/29/2013    Recent Labs Lab 12/29/13 0338  NA 141  K 4.2  CL 103  CO2 26  BUN 13  CREATININE 0.92  CALCIUM 8.7  GLUCOSE 113*   No results found for: CKTOTAL, CKMB, CKMBINDEX, TROPONINI  Lipid Panel  Component Value Date/Time   CHOL 174 10/14/2006 0824   TRIG 98 10/14/2006 0824   HDL 19.4* 10/14/2006 0824   CHOLHDL 9.0 CALC 10/14/2006 0824   VLDL 20 10/14/2006 0824   LDLCALC 135* 10/14/2006 0824    EKG: 12/28/2013: Normal  sinus rhythm, left atrial enlargement, inferior infarct, anterior infarct old. No evidence of ischemia. No significant change compared to prior EKG.   FOLLOW UP PLANS AND APPOINTMENTS    Medication List    TAKE these medications        albuterol-ipratropium 18-103 MCG/ACT inhaler  Commonly known as:  COMBIVENT  Inhale 2 puffs into the lungs every 4 (four) hours.     aspirin EC 81 MG tablet  Take 81 mg by mouth daily.     atorvastatin 20 MG tablet  Commonly known as:  LIPITOR  Take 20 mg by mouth daily.     clopidogrel 75 MG tablet  Commonly known as:  PLAVIX  Take 1 tablet (75 mg total) by mouth daily with breakfast.     furosemide 20 MG tablet  Commonly known as:  LASIX  Take 20 mg by mouth 2 (two) times daily.     lisinopril 5 MG tablet  Commonly known as:  PRINIVIL,ZESTRIL  Take 5 mg by mouth daily.     metoprolol tartrate 25 MG tablet  Commonly known as:  LOPRESSOR  Take 1 tablet (25 mg total) by mouth 2 (two) times daily.     OVER THE COUNTER MEDICATION  Take 2 tablets by mouth every 4 (four) hours as needed (for shortness of breath, Lung Clear).           Follow-up Information    Follow up with Woody Seller Sylvan Cheese, MD.   Specialty:  Cardiology   Why:  Keep previous appointment   Contact information:   [address] [place] [address] [phone]        Laverda Page, MD 12/29/2013, 9:37 AM  Pager: [phone] Office: [phone] If no answer: [phone]

## 2014-01-05 DIAGNOSIS — I2510 Atherosclerotic heart disease of native coronary artery without angina pectoris: Secondary | ICD-10-CM | POA: Diagnosis not present

## 2014-01-05 DIAGNOSIS — I5042 Chronic combined systolic (congestive) and diastolic (congestive) heart failure: Secondary | ICD-10-CM | POA: Diagnosis not present

## 2014-01-05 DIAGNOSIS — I420 Dilated cardiomyopathy: Secondary | ICD-10-CM | POA: Diagnosis not present

## 2014-01-05 DIAGNOSIS — I10 Essential (primary) hypertension: Secondary | ICD-10-CM | POA: Diagnosis not present

## 2014-01-11 DIAGNOSIS — E780 Pure hypercholesterolemia: Secondary | ICD-10-CM | POA: Diagnosis not present

## 2014-02-03 ENCOUNTER — Encounter (HOSPITAL_COMMUNITY): Payer: Self-pay | Admitting: Cardiology

## 2014-02-28 DIAGNOSIS — Z79899 Other long term (current) drug therapy: Secondary | ICD-10-CM | POA: Diagnosis not present

## 2014-02-28 DIAGNOSIS — E780 Pure hypercholesterolemia: Secondary | ICD-10-CM | POA: Diagnosis not present

## 2014-03-07 DIAGNOSIS — I10 Essential (primary) hypertension: Secondary | ICD-10-CM | POA: Diagnosis not present

## 2014-03-07 DIAGNOSIS — E780 Pure hypercholesterolemia: Secondary | ICD-10-CM | POA: Diagnosis not present

## 2014-03-23 DIAGNOSIS — I10 Essential (primary) hypertension: Secondary | ICD-10-CM | POA: Diagnosis not present

## 2014-04-13 ENCOUNTER — Ambulatory Visit (INDEPENDENT_AMBULATORY_CARE_PROVIDER_SITE_OTHER): Payer: Medicare Other

## 2014-04-13 ENCOUNTER — Ambulatory Visit (INDEPENDENT_AMBULATORY_CARE_PROVIDER_SITE_OTHER): Payer: Medicare Other | Admitting: Emergency Medicine

## 2014-04-13 DIAGNOSIS — S81801A Unspecified open wound, right lower leg, initial encounter: Secondary | ICD-10-CM

## 2014-04-13 DIAGNOSIS — Z23 Encounter for immunization: Secondary | ICD-10-CM

## 2014-04-13 MED ORDER — MUPIROCIN 2 % EX OINT: TOPICAL_OINTMENT | CUTANEOUS | Status: DC

## 2014-04-13 NOTE — Progress Notes (Signed)
   Subjective:    Patient ID: [name] [name], male    DOB: [birth year], 81 y.o.   MRN: [id number]  This chart was scribed for Jenny Reichmann, MD by Stephania Fragmin, ED Scribe. This patient was seen in room 13 and the patient's care was started at 9:21 AM.   HPI   This history is provided by the patient. No language interpreter was used.  HPI Comments: [name] [name] is a 81 y.o. male with a history of CHF, triple CABG, and pre-diabetes who presents to the [facility] for a wound check on his right leg. About 8 months ago, he scraped his leg on a metal bed frame while moving his mattress. The area had scarred and patient didn't think much about it, until he recently went to a spa and soaked his foot in hot water, which seemed to worsen the area. He reports very mild stinging pain to the area. Patient states he is pre-diabetic, and has been told by a podiatrist that he has moderate neuropathy. Patient's last blood sugar was 110 taken about 1 month ago when patient had CHF, and 95 taken 2 months ago. He did not have any recent X-Rays. Patient denies having a tetanus shot in the last 10 years. Dr. Linda Hedges was patient's PCP, but he retired, so patient is in between PCPs at this point.    Review of Systems  Constitutional: Negative for fever, chills, fatigue and unexpected weight change.  Eyes: Negative for visual disturbance.  Respiratory: Negative for cough, chest tightness and shortness of breath.   Cardiovascular: Negative for chest pain, palpitations and leg swelling.  Gastrointestinal: Negative for abdominal pain and blood in stool.  Skin: Positive for wound.  Neurological: Negative for dizziness, light-headedness and headaches.       Objective:   Physical Exam  Nursing note and vitals reviewed.  CONSTITUTIONAL: Well developed/well nourished HEAD: Normocephalic/atraumatic EYES: EOMI/PERRL ENMT: Mucous membranes moist NECK: supple no meningeal signs SPINE/BACK:entire spine  nontender CV: S1/S2 noted, no murmurs/rubs/gallops noted LUNGS: Lungs are clear to auscultation bilaterally, no apparent distress ABDOMEN: soft, nontender, no rebound or guarding, bowel sounds noted throughout abdomen GU:no cva tenderness NEURO: Pt is awake/alert/appropriate, moves all extremitiesx4.  No facial droop. Mild decreased sensation to filament testing; position sense intact. EXTREMITIES: pulses normal/equal, full ROM. 3x1 inch abrasion to right lower leg with mild erythema. SKIN: warm, color normal PSYCH: no abnormalities of mood noted, alert and oriented to situation  [facility] reading (PRIMARY) by  Dr.Allycia Pitz patient has large plantar and Achilles spurs. No soft tissue foreign body is seen in the lower leg        Assessment & Plan:   Patient will clean the area with soap and water followed by application of Bactroban ointment. We will update his tetanus status.I personally performed the services described in this documentation, which was scribed in my presence. The recorded information has been reviewed and is accurate.

## 2014-05-16 ENCOUNTER — Other Ambulatory Visit: Payer: Self-pay | Admitting: Emergency Medicine

## 2014-05-18 NOTE — Telephone Encounter (Signed)
Do you want to give RF or RTC?

## 2014-06-06 DIAGNOSIS — R011 Cardiac murmur, unspecified: Secondary | ICD-10-CM | POA: Diagnosis not present

## 2014-06-06 DIAGNOSIS — J45909 Unspecified asthma, uncomplicated: Secondary | ICD-10-CM | POA: Diagnosis not present

## 2014-06-06 DIAGNOSIS — E785 Hyperlipidemia, unspecified: Secondary | ICD-10-CM | POA: Diagnosis not present

## 2014-06-06 DIAGNOSIS — I10 Essential (primary) hypertension: Secondary | ICD-10-CM | POA: Diagnosis not present

## 2014-06-06 DIAGNOSIS — Z0000 Encounter for general adult medical examination without abnormal findings: Secondary | ICD-10-CM | POA: Diagnosis not present

## 2014-06-06 DIAGNOSIS — I2510 Atherosclerotic heart disease of native coronary artery without angina pectoris: Secondary | ICD-10-CM | POA: Diagnosis not present

## 2014-06-06 DIAGNOSIS — Z1389 Encounter for screening for other disorder: Secondary | ICD-10-CM | POA: Diagnosis not present

## 2014-06-13 DIAGNOSIS — E780 Pure hypercholesterolemia: Secondary | ICD-10-CM | POA: Diagnosis not present

## 2014-06-13 DIAGNOSIS — I255 Ischemic cardiomyopathy: Secondary | ICD-10-CM | POA: Diagnosis not present

## 2014-06-13 DIAGNOSIS — Z79899 Other long term (current) drug therapy: Secondary | ICD-10-CM | POA: Diagnosis not present

## 2014-06-13 DIAGNOSIS — I2510 Atherosclerotic heart disease of native coronary artery without angina pectoris: Secondary | ICD-10-CM | POA: Diagnosis not present

## 2014-07-05 DIAGNOSIS — I420 Dilated cardiomyopathy: Secondary | ICD-10-CM | POA: Diagnosis not present

## 2014-07-05 DIAGNOSIS — I255 Ischemic cardiomyopathy: Secondary | ICD-10-CM | POA: Diagnosis not present

## 2014-09-05 DIAGNOSIS — E785 Hyperlipidemia, unspecified: Secondary | ICD-10-CM | POA: Diagnosis not present

## 2014-09-05 DIAGNOSIS — J45909 Unspecified asthma, uncomplicated: Secondary | ICD-10-CM | POA: Diagnosis not present

## 2014-09-05 DIAGNOSIS — I10 Essential (primary) hypertension: Secondary | ICD-10-CM | POA: Diagnosis not present

## 2014-09-05 DIAGNOSIS — R7309 Other abnormal glucose: Secondary | ICD-10-CM | POA: Diagnosis not present

## 2014-09-05 DIAGNOSIS — I2510 Atherosclerotic heart disease of native coronary artery without angina pectoris: Secondary | ICD-10-CM | POA: Diagnosis not present

## 2014-09-05 DIAGNOSIS — R011 Cardiac murmur, unspecified: Secondary | ICD-10-CM | POA: Diagnosis not present

## 2014-09-13 DIAGNOSIS — E669 Obesity, unspecified: Secondary | ICD-10-CM | POA: Diagnosis not present

## 2014-09-13 DIAGNOSIS — I255 Ischemic cardiomyopathy: Secondary | ICD-10-CM | POA: Diagnosis not present

## 2014-09-13 DIAGNOSIS — I2510 Atherosclerotic heart disease of native coronary artery without angina pectoris: Secondary | ICD-10-CM | POA: Diagnosis not present

## 2014-09-13 DIAGNOSIS — E780 Pure hypercholesterolemia: Secondary | ICD-10-CM | POA: Diagnosis not present

## 2014-09-19 DIAGNOSIS — E780 Pure hypercholesterolemia: Secondary | ICD-10-CM | POA: Diagnosis not present

## 2014-09-19 DIAGNOSIS — I2510 Atherosclerotic heart disease of native coronary artery without angina pectoris: Secondary | ICD-10-CM | POA: Diagnosis not present

## 2014-10-05 DIAGNOSIS — Z131 Encounter for screening for diabetes mellitus: Secondary | ICD-10-CM | POA: Diagnosis not present

## 2014-11-15 DIAGNOSIS — I10 Essential (primary) hypertension: Secondary | ICD-10-CM | POA: Diagnosis not present

## 2014-11-15 DIAGNOSIS — R7301 Impaired fasting glucose: Secondary | ICD-10-CM | POA: Diagnosis not present

## 2015-01-02 ENCOUNTER — Encounter: Payer: Self-pay | Admitting: Internal Medicine

## 2015-01-17 DIAGNOSIS — I255 Ischemic cardiomyopathy: Secondary | ICD-10-CM | POA: Diagnosis not present

## 2015-01-17 DIAGNOSIS — I2510 Atherosclerotic heart disease of native coronary artery without angina pectoris: Secondary | ICD-10-CM | POA: Diagnosis not present

## 2015-01-17 DIAGNOSIS — E669 Obesity, unspecified: Secondary | ICD-10-CM | POA: Diagnosis not present

## 2015-01-17 DIAGNOSIS — I10 Essential (primary) hypertension: Secondary | ICD-10-CM | POA: Diagnosis not present

## 2015-02-01 DIAGNOSIS — I10 Essential (primary) hypertension: Secondary | ICD-10-CM | POA: Diagnosis not present

## 2015-02-03 ENCOUNTER — Encounter: Payer: Self-pay | Admitting: Internal Medicine

## 2015-03-29 DIAGNOSIS — M65341 Trigger finger, right ring finger: Secondary | ICD-10-CM | POA: Diagnosis not present

## 2015-04-03 DIAGNOSIS — E1100 Type 2 diabetes mellitus with hyperosmolarity without nonketotic hyperglycemic-hyperosmolar coma (NKHHC): Secondary | ICD-10-CM | POA: Diagnosis not present

## 2015-04-17 DIAGNOSIS — I2510 Atherosclerotic heart disease of native coronary artery without angina pectoris: Secondary | ICD-10-CM | POA: Diagnosis not present

## 2015-04-17 DIAGNOSIS — I10 Essential (primary) hypertension: Secondary | ICD-10-CM | POA: Diagnosis not present

## 2015-04-17 DIAGNOSIS — E119 Type 2 diabetes mellitus without complications: Secondary | ICD-10-CM | POA: Diagnosis not present

## 2015-04-17 DIAGNOSIS — J45909 Unspecified asthma, uncomplicated: Secondary | ICD-10-CM | POA: Diagnosis not present

## 2015-04-17 DIAGNOSIS — E785 Hyperlipidemia, unspecified: Secondary | ICD-10-CM | POA: Diagnosis not present

## 2015-04-17 DIAGNOSIS — R011 Cardiac murmur, unspecified: Secondary | ICD-10-CM | POA: Diagnosis not present

## 2015-04-26 DIAGNOSIS — M65341 Trigger finger, right ring finger: Secondary | ICD-10-CM | POA: Diagnosis not present

## 2015-05-10 DIAGNOSIS — Z0130 Encounter for examination of blood pressure without abnormal findings: Secondary | ICD-10-CM | POA: Diagnosis not present

## 2015-05-10 DIAGNOSIS — E119 Type 2 diabetes mellitus without complications: Secondary | ICD-10-CM | POA: Diagnosis not present

## 2015-05-17 DIAGNOSIS — I10 Essential (primary) hypertension: Secondary | ICD-10-CM | POA: Diagnosis not present

## 2015-05-17 DIAGNOSIS — E7800 Pure hypercholesterolemia, unspecified: Secondary | ICD-10-CM | POA: Diagnosis not present

## 2015-05-17 DIAGNOSIS — I255 Ischemic cardiomyopathy: Secondary | ICD-10-CM | POA: Diagnosis not present

## 2015-05-17 DIAGNOSIS — I2510 Atherosclerotic heart disease of native coronary artery without angina pectoris: Secondary | ICD-10-CM | POA: Diagnosis not present

## 2015-06-13 DIAGNOSIS — E785 Hyperlipidemia, unspecified: Secondary | ICD-10-CM | POA: Diagnosis not present

## 2015-06-13 DIAGNOSIS — R011 Cardiac murmur, unspecified: Secondary | ICD-10-CM | POA: Diagnosis not present

## 2015-06-13 DIAGNOSIS — N400 Enlarged prostate without lower urinary tract symptoms: Secondary | ICD-10-CM | POA: Diagnosis not present

## 2015-06-13 DIAGNOSIS — Z23 Encounter for immunization: Secondary | ICD-10-CM | POA: Diagnosis not present

## 2015-06-13 DIAGNOSIS — J45909 Unspecified asthma, uncomplicated: Secondary | ICD-10-CM | POA: Diagnosis not present

## 2015-06-13 DIAGNOSIS — R609 Edema, unspecified: Secondary | ICD-10-CM | POA: Diagnosis not present

## 2015-06-13 DIAGNOSIS — Z0000 Encounter for general adult medical examination without abnormal findings: Secondary | ICD-10-CM | POA: Diagnosis not present

## 2015-06-13 DIAGNOSIS — Z1389 Encounter for screening for other disorder: Secondary | ICD-10-CM | POA: Diagnosis not present

## 2015-06-13 DIAGNOSIS — I10 Essential (primary) hypertension: Secondary | ICD-10-CM | POA: Diagnosis not present

## 2015-06-13 DIAGNOSIS — I2510 Atherosclerotic heart disease of native coronary artery without angina pectoris: Secondary | ICD-10-CM | POA: Diagnosis not present

## 2015-06-13 DIAGNOSIS — E119 Type 2 diabetes mellitus without complications: Secondary | ICD-10-CM | POA: Diagnosis not present

## 2015-07-05 DIAGNOSIS — M65341 Trigger finger, right ring finger: Secondary | ICD-10-CM | POA: Diagnosis not present

## 2015-08-02 DIAGNOSIS — M65341 Trigger finger, right ring finger: Secondary | ICD-10-CM | POA: Diagnosis not present

## 2015-09-06 DIAGNOSIS — M65341 Trigger finger, right ring finger: Secondary | ICD-10-CM | POA: Diagnosis not present

## 2015-09-13 DIAGNOSIS — I2510 Atherosclerotic heart disease of native coronary artery without angina pectoris: Secondary | ICD-10-CM | POA: Diagnosis not present

## 2015-09-13 DIAGNOSIS — E7800 Pure hypercholesterolemia, unspecified: Secondary | ICD-10-CM | POA: Diagnosis not present

## 2015-09-19 DIAGNOSIS — I255 Ischemic cardiomyopathy: Secondary | ICD-10-CM | POA: Diagnosis not present

## 2015-09-19 DIAGNOSIS — I2510 Atherosclerotic heart disease of native coronary artery without angina pectoris: Secondary | ICD-10-CM | POA: Diagnosis not present

## 2015-09-19 DIAGNOSIS — E7800 Pure hypercholesterolemia, unspecified: Secondary | ICD-10-CM | POA: Diagnosis not present

## 2015-09-19 DIAGNOSIS — I10 Essential (primary) hypertension: Secondary | ICD-10-CM | POA: Diagnosis not present

## 2015-10-06 DIAGNOSIS — M65341 Trigger finger, right ring finger: Secondary | ICD-10-CM | POA: Diagnosis not present

## 2015-10-16 DIAGNOSIS — M65341 Trigger finger, right ring finger: Secondary | ICD-10-CM | POA: Diagnosis not present

## 2015-11-30 DIAGNOSIS — E119 Type 2 diabetes mellitus without complications: Secondary | ICD-10-CM | POA: Diagnosis not present

## 2015-11-30 DIAGNOSIS — Z6831 Body mass index (BMI) 31.0-31.9, adult: Secondary | ICD-10-CM | POA: Diagnosis not present

## 2015-12-12 DIAGNOSIS — I255 Ischemic cardiomyopathy: Secondary | ICD-10-CM | POA: Diagnosis not present

## 2015-12-12 DIAGNOSIS — E785 Hyperlipidemia, unspecified: Secondary | ICD-10-CM | POA: Diagnosis not present

## 2015-12-12 DIAGNOSIS — E1165 Type 2 diabetes mellitus with hyperglycemia: Secondary | ICD-10-CM | POA: Diagnosis not present

## 2015-12-12 DIAGNOSIS — I2510 Atherosclerotic heart disease of native coronary artery without angina pectoris: Secondary | ICD-10-CM | POA: Diagnosis not present

## 2015-12-12 DIAGNOSIS — Z23 Encounter for immunization: Secondary | ICD-10-CM | POA: Diagnosis not present

## 2015-12-12 DIAGNOSIS — Z6833 Body mass index (BMI) 33.0-33.9, adult: Secondary | ICD-10-CM | POA: Diagnosis not present

## 2015-12-12 DIAGNOSIS — I10 Essential (primary) hypertension: Secondary | ICD-10-CM | POA: Diagnosis not present

## 2015-12-12 DIAGNOSIS — E119 Type 2 diabetes mellitus without complications: Secondary | ICD-10-CM | POA: Diagnosis not present

## 2015-12-12 DIAGNOSIS — J45909 Unspecified asthma, uncomplicated: Secondary | ICD-10-CM | POA: Diagnosis not present

## 2015-12-16 ENCOUNTER — Ambulatory Visit (INDEPENDENT_AMBULATORY_CARE_PROVIDER_SITE_OTHER): Payer: Medicare Other | Admitting: Family Medicine

## 2015-12-16 VITALS — BP 102/68 | HR 74 | Temp 97.8°F | Resp 18 | Ht 69.0 in | Wt 219.0 lb

## 2015-12-16 DIAGNOSIS — L608 Other nail disorders: Secondary | ICD-10-CM | POA: Diagnosis not present

## 2015-12-16 DIAGNOSIS — L03031 Cellulitis of right toe: Secondary | ICD-10-CM

## 2015-12-16 DIAGNOSIS — E1142 Type 2 diabetes mellitus with diabetic polyneuropathy: Secondary | ICD-10-CM | POA: Diagnosis not present

## 2015-12-16 MED ORDER — SULFAMETHOXAZOLE-TRIMETHOPRIM 800-160 MG PO TABS: 1.0000 | ORAL_TABLET | Freq: Two times a day (BID) | ORAL | 0 refills | Status: DC

## 2015-12-16 MED ORDER — CEPHALEXIN 500 MG PO CAPS: 500.0000 mg | ORAL_CAPSULE | Freq: Four times a day (QID) | ORAL | 0 refills | Status: DC

## 2015-12-16 MED ORDER — MUPIROCIN 2 % EX OINT: TOPICAL_OINTMENT | Freq: Three times a day (TID) | CUTANEOUS | 0 refills | Status: DC

## 2015-12-16 NOTE — Patient Instructions (Addendum)
Warm water soaks twice a day, clean with soap and warm water.  Apply mupirocin after.  Recheck with me Tuesday morning   IF you received an x-ray today, you will receive an invoice from [facility]. Please contact [facility] at [phone] with questions or concerns regarding your invoice.   IF you received labwork today, you will receive an invoice from [employer]. Please contact [facility] at [phone] with questions or concerns regarding your invoice.   Our billing staff will not be able to assist you with questions regarding bills from these companies.  You will be contacted with the lab results as soon as they are available. The fastest way to get your results is to activate your My Chart account. Instructions are located on the last page of this paperwork. If you have not heard from [place] regarding the results in 2 weeks, please contact this office.    Paronychia Paronychia is an infection of the skin that surrounds a nail. It usually affects the skin around a fingernail, but it may also occur near a toenail. It often causes pain and swelling around the nail. This condition may come on suddenly or develop over a longer period. In some cases, a collection of pus (abscess) can form near or under the nail. Usually, paronychia is not serious and it clears up with treatment. CAUSES This condition may be caused by bacteria or fungi. It is commonly caused by either Streptococcus or Staphylococcus bacteria. The bacteria or fungi often cause the infection by getting into the affected area through an opening in the skin, such as a cut or a hangnail. RISK FACTORS This condition is more likely to develop in:  People who get their hands wet often, such as those who work as Designer, industrial/product, bartenders, or nurses.  People who bite their fingernails or suck their thumbs.  People who trim their nails too short.  People who have hangnails or injured  fingertips.  People who get manicures.  People who have diabetes. SYMPTOMS Symptoms of this condition include:  Redness and swelling of the skin near the nail.  Tenderness around the nail when you touch the area.  Pus-filled bumps under the cuticle. The cuticle is the skin at the base or sides of the nail.  Fluid or pus under the nail.  Throbbing pain in the area. DIAGNOSIS This condition is usually diagnosed with a physical exam. In some cases, a sample of pus may be taken from an abscess to be tested in a lab. This can help to determine what type of bacteria or fungi is causing the condition. TREATMENT Treatment for this condition depends on the cause and severity of the condition. If the condition is mild, it may clear up on its own in a few days. Your health care provider may recommend soaking the affected area in warm water a few times a day. When treatment is needed, the options may include:  Antibiotic medicine, if the condition is caused by a bacterial infection.  Antifungal medicine, if the condition is caused by a fungal infection.  Incision and drainage, if an abscess is present. In this procedure, the health care provider will cut open the abscess so the pus can drain out. HOME CARE INSTRUCTIONS  Soak the affected area in warm water if directed to do so by your health care provider. You may be told to do this for 20 minutes, 2-3 times a day. Keep the area dry in between soakings.  Take medicines only  as directed by your health care provider.  If you were prescribed an antibiotic medicine, finish all of it even if you start to feel better.  Keep the affected area clean.  Do not try to drain a fluid-filled bump yourself.  If you will be washing dishes or performing other tasks that require your hands to get wet, wear rubber gloves. You should also wear gloves if your hands might come in contact with irritating substances, such as cleaners or chemicals.  Follow your  health care provider's instructions about:  Wound care.  Bandage (dressing) changes and removal. SEEK MEDICAL CARE IF:  Your symptoms get worse or do not improve with treatment.  You have a fever or chills.  You have redness spreading from the affected area.  You have continued or increased fluid, blood, or pus coming from the affected area.  Your finger or knuckle becomes swollen or is difficult to move.   This information is not intended to replace advice given to you by your health care provider. Make sure you discuss any questions you have with your health care provider.   Document Released: 08/07/2000 Document Revised: 06/28/2014 Document Reviewed: 01/19/2014 Elsevier Interactive Patient Education [employer].

## 2015-12-16 NOTE — Progress Notes (Signed)
By signing my name below I, Tereasa Coop, attest that this documentation has been prepared under the direction and in the presence of Delman Cheadle, MD. Electonically Signed. Tereasa Coop, Scribe 12/16/2015 at 11:46 AM  Subjective:    Patient ID: [name] [name], male    DOB: 1928/02/27, 82 y.o.   MRN: [id number]  Chief Complaint  Patient presents with  . FOOT PAIN    RIGHT TOE PAIN    HPI [name] [name] is a 82 y.o. male who presents to the [facility] complaining of rt great toe pain from ingrown toe nail that started yesterday and has been worsening. Pt denies any discharge.   Pt has history of DM. Last A1C was 6.3.    Patient Active Problem List   Diagnosis Date Noted  . S/P PTCA (percutaneous transluminal coronary angioplasty) 12/28/2013  . CAD (coronary artery disease), native coronary artery 12/26/2013  . Postsurgical percutaneous transluminal coronary angioplasty (PTCA) status 11/25/2013  . ACUTE ATOPIC CONJUNCTIVITIS 01/08/2007  . ACUTE BRONCHITIS 01/08/2007  . INSOMNIA-SLEEP DISORDER-UNSPEC 12/19/2006  . ANXIETY 12/10/2006  . DEPRESSION 12/10/2006  . TINNITUS, CHRONIC 12/10/2006  . ALLERGIC RHINITIS 12/10/2006  . BENIGN PROSTATIC HYPERTROPHY 12/10/2006  . WEIGHT LOSS 12/10/2006  . GLUCOSE INTOLERANCE, MINIMAL, HX OF 12/10/2006  . NEPHROLITHIASIS, HX OF 12/10/2006  . RHINITIS, ALLERGIC NEC 12/09/2006  . Unspecified asthma(493.90) 12/09/2006  . COLONIC POLYPS 11/24/2006  . DIABETES MELLITUS, TYPE II 11/24/2006  . HYPERLIPIDEMIA 11/24/2006  . HYPERTENSION 11/24/2006  . GERD 11/24/2006  . Diverticulosis of colon (without mention of hemorrhage) 11/24/2006  . CALCULUS, KIDNEY 11/24/2006  . BENIGN PROSTATIC HYPERTROPHY, HX OF 11/24/2006    Current Outpatient Prescriptions on File Prior to Visit  Medication Sig Dispense Refill  . aspirin EC 81 MG tablet Take 81 mg by mouth daily.    Marland Kitchen atorvastatin (LIPITOR) 40 MG tablet Take 40 mg by mouth daily.     . clopidogrel (PLAVIX) 75 MG tablet Take 1 tablet (75 mg total) by mouth daily with breakfast. 30 tablet 11  . metoprolol tartrate (LOPRESSOR) 25 MG tablet Take 1 tablet (25 mg total) by mouth 2 (two) times daily.    Marland Kitchen OVER THE COUNTER MEDICATION Take 2 tablets by mouth every 4 (four) hours as needed (for shortness of breath, Lung Clear).    . valsartan-hydrochlorothiazide (DIOVAN-HCT) 160-25 MG per tablet Take 1 tablet by mouth daily.    Marland Kitchen albuterol-ipratropium (COMBIVENT) 18-103 MCG/ACT inhaler Inhale 2 puffs into the lungs every 4 (four) hours.     . [DISCONTINUED] levalbuterol (XOPENEX) 0.63 MG/3ML nebulizer solution Take 3 mLs (0.63 mg total) by nebulization 3 (three) times daily. 3 mL 12   No current facility-administered medications on file prior to visit.     No Known Allergies  Depression screen PHQ 2/9 12/16/2015  Decreased Interest 0  Down, Depressed, Hopeless 0  PHQ - 2 Score 0  Some recent data might be hidden       Review of Systems  Constitutional: Negative for fever.  HENT: Negative for congestion and sore throat.   Respiratory: Negative for cough and shortness of breath.   Cardiovascular: Negative for chest pain.  Gastrointestinal: Negative for abdominal pain.  Endocrine: Negative for polydipsia.  Genitourinary: Negative for dysuria and frequency.  Musculoskeletal: Negative for back pain.       Positive for rt 1st toe pain  Neurological: Negative for headaches.  Psychiatric/Behavioral: Negative for sleep disturbance.       Objective:  Physical Exam  Constitutional: He is oriented to person, place, and time. He appears well-developed and well-nourished. No distress.  HENT:  Head: Normocephalic and atraumatic.  Eyes: Conjunctivae are normal. Pupils are equal, round, and reactive to light.  Neck: Neck supple.  Cardiovascular: Normal rate.   Pulmonary/Chest: Effort normal.  Musculoskeletal: Normal range of motion.  Neurological: He is alert and oriented  to person, place, and time.  Skin: Skin is warm and dry.  Right Great Toe Exam: Thickened mycotic nail, worse on medial aspect with eschar over medial cuticle and minimal amount of purulent drainage. The skin over nail fold is tender to palpation and has blanching erythema along the proximal nail fold. There is no increased warmth or fluctuance. Cap refill less than 1 second.   Psychiatric: He has a normal mood and affect. His behavior is normal.  Nursing note and vitals reviewed.  BP 102/68 (BP Location: Right Arm, Patient Position: Sitting, Cuff Size: Large)   Pulse 74   Temp 97.8 F (36.6 C) (Oral)   Resp 18   Ht 5\' 9"  (1.753 m)   Wt 219 lb (99.3 kg)   SpO2 94%   BMI 32.34 kg/m         Assessment & Plan:   1. Paronychia of great toe of right foot   2. Onychomadesis of toenail   3. Type 2 diabetes mellitus with diabetic polyneuropathy, without long-term current use of insulin ([place])     Orders Placed This Encounter  Procedures  . WOUND CULTURE    Order Specific Question:   Source    Answer:   right great toenail    Meds ordered this encounter  Medications  . spironolactone (ALDACTONE) 25 MG tablet    Sig: Take 25 mg by mouth daily.  . mupirocin ointment (BACTROBAN) 2 %    Sig: Apply topically 3 (three) times daily.    Dispense:  30 g    Refill:  0  . cephALEXin (KEFLEX) 500 MG capsule    Sig: Take 1 capsule (500 mg total) by mouth 4 (four) times daily.    Dispense:  28 capsule    Refill:  0  . sulfamethoxazole-trimethoprim (BACTRIM DS,SEPTRA DS) 800-160 MG tablet    Sig: Take 1 tablet by mouth 2 (two) times daily.    Dispense:  14 tablet    Refill:  0    I personally performed the services described in this documentation, which was scribed in my presence. The recorded information has been reviewed and considered, and addended by me as needed.   Delman Cheadle, M.D.  [facility] [address] [place], [place] [address] [phone]  phone [phone] fax  12/19/15 10:24 AM

## 2015-12-19 ENCOUNTER — Encounter: Payer: Self-pay | Admitting: Family Medicine

## 2015-12-19 ENCOUNTER — Ambulatory Visit (INDEPENDENT_AMBULATORY_CARE_PROVIDER_SITE_OTHER): Payer: Medicare Other | Admitting: Family Medicine

## 2015-12-19 VITALS — BP 132/80 | HR 74 | Temp 98.0°F | Resp 16 | Ht 69.0 in | Wt 218.0 lb

## 2015-12-19 DIAGNOSIS — B351 Tinea unguium: Secondary | ICD-10-CM

## 2015-12-19 DIAGNOSIS — L03031 Cellulitis of right toe: Secondary | ICD-10-CM

## 2015-12-19 DIAGNOSIS — E1140 Type 2 diabetes mellitus with diabetic neuropathy, unspecified: Secondary | ICD-10-CM

## 2015-12-19 NOTE — Patient Instructions (Signed)
     IF you received an x-ray today, you will receive an invoice from [facility]. Please contact [facility] at [phone] with questions or concerns regarding your invoice.   IF you received labwork today, you will receive an invoice from [facility]. Please contact [facility] at [phone] with questions or concerns regarding your invoice.   Our billing staff will not be able to assist you with questions regarding bills from these companies.  You will be contacted with the lab results as soon as they are available. The fastest way to get your results is to activate your My Chart account. Instructions are located on the last page of this paperwork. If you have not heard from us regarding the results in 2 weeks, please contact this office.      

## 2015-12-20 LAB — WOUND CULTURE
GRAM STAIN: NONE SEEN
Gram Stain: NONE SEEN

## 2015-12-20 NOTE — Progress Notes (Signed)
   Subjective:    Patient ID: [name] [name], male    DOB: [birth year], 82 y.o.   MRN: [id number] Chief Complaint  Patient presents with  . Wound Check    x 4 days, rt. big toe     HPI Not draining, no pain. Has been doing bid soaks, top mupirocin and covering with coban.   Review of Systems  Musculoskeletal: Positive for arthralgias. Negative for gait problem and joint swelling.  Skin: Positive for color change and wound.       Objective:   Physical Exam  Constitutional: He is oriented to person, place, and time. He appears well-developed and well-nourished. No distress.  HENT:  Head: Normocephalic and atraumatic.  Eyes: No scleral icterus.  Pulmonary/Chest: Effort normal.  Neurological: He is alert and oriented to person, place, and time.  Skin: Skin is warm and dry. He is not diaphoretic.  Right medial asp  Psychiatric: He has a normal mood and affect. His behavior is normal.      BP 132/80 (BP Location: Right Arm, Patient Position: Sitting, Cuff Size: Normal)   Pulse 74   Temp 98 F (36.7 C) (Oral)   Resp 16   Ht 5\' 9"  (1.753 m)   Wt 218 lb (98.9 kg)   SpO2 96%   BMI 32.19 kg/m      Assessment & Plan:   1. Paronychia of great toe, right   2. Type 2 diabetes mellitus with diabetic neuropathy, unspecified long term insulin use status ([place])   3. Onychomycosis   Starting to improve, no signs of retained purulence requiring I&D though still draining some.  Clx still P, tolerating bactrim and keflex well, cont.  I think toenail should grow out fine - no need for nail removal at this time but pt will make an appt to see podiatry for further nail care rather than salon pedicure.  Cont bid-tid warm water soaks followed by top mupirocin and keep open to air. F/u prn.     Delman Cheadle, M.D.  [facility] [address] [place], [place] [address] [phone] phone [phone] fax  12/20/15 12:23 AM

## 2016-01-11 DIAGNOSIS — M65341 Trigger finger, right ring finger: Secondary | ICD-10-CM | POA: Diagnosis not present

## 2016-02-06 DIAGNOSIS — E119 Type 2 diabetes mellitus without complications: Secondary | ICD-10-CM | POA: Diagnosis not present

## 2016-02-06 DIAGNOSIS — Z6830 Body mass index (BMI) 30.0-30.9, adult: Secondary | ICD-10-CM | POA: Diagnosis not present

## 2016-02-13 DIAGNOSIS — I255 Ischemic cardiomyopathy: Secondary | ICD-10-CM | POA: Diagnosis not present

## 2016-02-13 DIAGNOSIS — I2510 Atherosclerotic heart disease of native coronary artery without angina pectoris: Secondary | ICD-10-CM | POA: Diagnosis not present

## 2016-02-13 DIAGNOSIS — E7800 Pure hypercholesterolemia, unspecified: Secondary | ICD-10-CM | POA: Diagnosis not present

## 2016-02-13 DIAGNOSIS — I10 Essential (primary) hypertension: Secondary | ICD-10-CM | POA: Diagnosis not present

## 2016-02-20 DIAGNOSIS — I10 Essential (primary) hypertension: Secondary | ICD-10-CM | POA: Diagnosis not present

## 2016-04-09 ENCOUNTER — Ambulatory Visit (INDEPENDENT_AMBULATORY_CARE_PROVIDER_SITE_OTHER): Payer: Medicare Other | Admitting: Sports Medicine

## 2016-04-09 ENCOUNTER — Ambulatory Visit (INDEPENDENT_AMBULATORY_CARE_PROVIDER_SITE_OTHER): Payer: Medicare Other | Admitting: Family Medicine

## 2016-04-09 ENCOUNTER — Ambulatory Visit (INDEPENDENT_AMBULATORY_CARE_PROVIDER_SITE_OTHER): Payer: Medicare Other

## 2016-04-09 ENCOUNTER — Encounter: Payer: Self-pay | Admitting: Sports Medicine

## 2016-04-09 VITALS — BP 118/64 | HR 73 | Resp 20 | Ht 69.0 in | Wt 222.0 lb

## 2016-04-09 DIAGNOSIS — M79671 Pain in right foot: Secondary | ICD-10-CM

## 2016-04-09 DIAGNOSIS — L97519 Non-pressure chronic ulcer of other part of right foot with unspecified severity: Secondary | ICD-10-CM | POA: Diagnosis not present

## 2016-04-09 DIAGNOSIS — L84 Corns and callosities: Secondary | ICD-10-CM | POA: Diagnosis not present

## 2016-04-09 DIAGNOSIS — E11621 Type 2 diabetes mellitus with foot ulcer: Secondary | ICD-10-CM

## 2016-04-09 DIAGNOSIS — B351 Tinea unguium: Secondary | ICD-10-CM

## 2016-04-09 DIAGNOSIS — E119 Type 2 diabetes mellitus without complications: Secondary | ICD-10-CM | POA: Diagnosis not present

## 2016-04-09 LAB — POCT CBC
GRANULOCYTE PERCENT: 72.2 % (ref 37–80)
HCT, POC: 44.5 % (ref 43.5–53.7)
HEMOGLOBIN: 15.9 g/dL (ref 14.1–18.1)
Lymph, poc: 1.3 (ref 0.6–3.4)
MCH: 30.9 pg (ref 27–31.2)
MCHC: 35.7 g/dL — AB (ref 31.8–35.4)
MCV: 86.6 fL (ref 80–97)
MID (cbc): 0.7 (ref 0–0.9)
MPV: 7.7 fL (ref 0–99.8)
PLATELET COUNT, POC: 159 10*3/uL (ref 142–424)
POC Granulocyte: 5.1 (ref 2–6.9)
POC LYMPH PERCENT: 18.5 %L (ref 10–50)
POC MID %: 9.3 % (ref 0–12)
RBC: 5.14 M/uL (ref 4.69–6.13)
RDW, POC: 15.4 %
WBC: 7 10*3/uL (ref 4.6–10.2)

## 2016-04-09 LAB — POCT GLYCOSYLATED HEMOGLOBIN (HGB A1C): Hemoglobin A1C: 6.5

## 2016-04-09 NOTE — Progress Notes (Signed)
Patient ID: [name] [name], male    DOB: 02/15/1929, 83 y.o.   MRN: [id number]  PCP: Gara Kroner, MD  Chief Complaint  Patient presents with  . Foot Pain    Black on ball of R foot. DIABETIC. Noticed about a week ago.    Subjective:  HPI 83 year old male, presents for evaluation of foot pain with thickened black callus right great toe. He is uncertain of the exact length of time the callus has been present but noticed it over 1 week ago due to increasing pain with walking. Reports pain has gotten worst and he has noticed increased swelling of great right toe. Denies injury nor does he recall stepping on any sharp objects.  Social History   Social History  . Marital status: Single    Spouse name: N/A  . Number of children: N/A  . Years of education: N/A   Occupational History  . Not on file.   Social History Main Topics  . Smoking status: Former Research scientist (life sciences)  . Smokeless tobacco: Never Used     Comment: "quit smoking in my 20's"  . Alcohol use Yes     Comment: "stopped drinking in the 1970's"  . Drug use: No  . Sexual activity: No   Other Topics Concern  . Not on file   Social History Narrative  . No narrative on file    Family History  Problem Relation Age of Onset  . Heart disease Mother   . Stroke Mother   . Heart disease Father   . Diabetes Brother    Review of Systems See HPI   Patient Active Problem List   Diagnosis Date Noted  . S/P PTCA (percutaneous transluminal coronary angioplasty) 12/28/2013  . CAD (coronary artery disease), native coronary artery 12/26/2013  . Postsurgical percutaneous transluminal coronary angioplasty (PTCA) status 11/25/2013  . ACUTE ATOPIC CONJUNCTIVITIS 01/08/2007  . ACUTE BRONCHITIS 01/08/2007  . INSOMNIA-SLEEP DISORDER-UNSPEC 12/19/2006  . ANXIETY 12/10/2006  . DEPRESSION 12/10/2006  . TINNITUS, CHRONIC 12/10/2006  . ALLERGIC RHINITIS 12/10/2006  . BENIGN PROSTATIC HYPERTROPHY 12/10/2006  . WEIGHT LOSS 12/10/2006  .  GLUCOSE INTOLERANCE, MINIMAL, HX OF 12/10/2006  . NEPHROLITHIASIS, HX OF 12/10/2006  . RHINITIS, ALLERGIC NEC 12/09/2006  . Unspecified asthma(493.90) 12/09/2006  . COLONIC POLYPS 11/24/2006  . DIABETES MELLITUS, TYPE II 11/24/2006  . HYPERLIPIDEMIA 11/24/2006  . HYPERTENSION 11/24/2006  . GERD 11/24/2006  . Diverticulosis of colon (without mention of hemorrhage) 11/24/2006  . CALCULUS, KIDNEY 11/24/2006  . BENIGN PROSTATIC HYPERTROPHY, HX OF 11/24/2006    No Known Allergies  Prior to Admission medications   Medication Sig Start Date End Date Taking? Authorizing Provider  albuterol-ipratropium (COMBIVENT) 18-103 MCG/ACT inhaler Inhale 2 puffs into the lungs every 4 (four) hours.    Yes Historical Provider, MD  aspirin EC 81 MG tablet Take 81 mg by mouth daily.   Yes Historical Provider, MD  atorvastatin (LIPITOR) 40 MG tablet Take 40 mg by mouth daily.   Yes Historical Provider, MD  clopidogrel (PLAVIX) 75 MG tablet Take 1 tablet (75 mg total) by mouth daily with breakfast. 11/26/13  Yes Adrian Prows, MD  metoprolol tartrate (LOPRESSOR) 25 MG tablet Take 1 tablet (25 mg total) by mouth 2 (two) times daily. 12/29/13  Yes Adrian Prows, MD  OVER THE COUNTER MEDICATION Take 2 tablets by mouth every 4 (four) hours as needed (for shortness of breath, Lung Clear).   Yes Historical Provider, MD  spironolactone (ALDACTONE) 25 MG tablet Take  25 mg by mouth daily.   Yes Historical Provider, MD  sulfamethoxazole-trimethoprim (BACTRIM DS,SEPTRA DS) 800-160 MG tablet Take 1 tablet by mouth 2 (two) times daily. 12/16/15  Yes Shawnee Knapp, MD  valsartan-hydrochlorothiazide (DIOVAN-HCT) 160-25 MG per tablet Take 1 tablet by mouth daily.   Yes Historical Provider, MD    Past Medical, Surgical Family and Social History reviewed and updated.    Objective:   Today's Vitals   04/09/16 0905  BP: 118/64  Pulse: 73  Resp: 20  SpO2: 96%  Weight: 222 lb (100.7 kg)  Height: 5\' 9"  (1.753 m)    Wt Readings from  Last 3 Encounters:  04/09/16 222 lb (100.7 kg)  12/19/15 218 lb (98.9 kg)  12/16/15 219 lb (99.3 kg)   Physical Exam  Constitutional: He is oriented to person, place, and time. He appears well-developed and well-nourished.  HENT:  Head: Normocephalic and atraumatic.  Right Ear: External ear normal.  Left Ear: External ear normal.  Eyes: Pupils are equal, round, and reactive to light.  Neck: Normal range of motion. Neck supple.  Cardiovascular: Normal rate, regular rhythm, normal heart sounds and intact distal pulses.   Pulmonary/Chest: Effort normal. He has decreased breath sounds in the right upper field, the right middle field, the right lower field, the left upper field, the left middle field and the left lower field.  Musculoskeletal: He exhibits edema and tenderness.  Great right toe thicken and edematous with blacken callused skin posterior great toe. Toe nail is thicken with jagged edges. Dried blood drainage visible underneath toe nail.  Neurological: He is alert and oriented to person, place, and time.  Skin: Skin is dry.  Bilateral feet extremely dry.  Psychiatric: He has a normal mood and affect. His behavior is normal. Judgment and thought content normal.     Diabetic Foot Exam - Simple   Simple Foot Form Diabetic Foot exam was performed with the following findings:  Yes 04/09/2016 10:00 AM  Visual Inspection See comments:  Yes Sensation Testing See comments:  Yes Pulse Check Comments Diminished sensation in toes and upper foot pads bilaterally evidence by monofilament test. Dorsalis pulse +2 bilaterally     Assessment & Plan:  1. Diabetic ulcer of toe of right foot associated with type 2 diabetes mellitus, unspecified ulcer stage ([place]) - POCT CBC - POCT glycosylated hemoglobin (Hb A1C) - Comprehensive metabolic panel  Ambulatory referral to Podiatry today for further evaluation  You are scheduled to see Dr. Cannon Kettle @ 3:30pm. [place]  [address] [phone]  I will notify you of your lab results.  Carroll Sage. Kenton Kingfisher, MSN, FNP-C [facility] [place]

## 2016-04-09 NOTE — Patient Instructions (Signed)
You are scheduled to see Dr. Cannon Kettle @ 3:30pm. Need to arrive at 3:15pm [place]  [address] [phone]

## 2016-04-09 NOTE — Progress Notes (Signed)
Subjective: [name] [name] is a 83 y.o. male patient with history of diabetes who presents to office today complaining of long, painful nails  while ambulating in shoes; unable to trim and a dark spot on right foot. Patient states that the glucose reading this morning was not recorded.Patient denies any new changes in medication or new problems. Patient denies any new cramping, numbness, burning or tingling in the legs.  Patient Active Problem List   Diagnosis Date Noted  . S/P PTCA (percutaneous transluminal coronary angioplasty) 12/28/2013  . CAD (coronary artery disease), native coronary artery 12/26/2013  . Postsurgical percutaneous transluminal coronary angioplasty (PTCA) status 11/25/2013  . ACUTE ATOPIC CONJUNCTIVITIS 01/08/2007  . ACUTE BRONCHITIS 01/08/2007  . INSOMNIA-SLEEP DISORDER-UNSPEC 12/19/2006  . ANXIETY 12/10/2006  . DEPRESSION 12/10/2006  . TINNITUS, CHRONIC 12/10/2006  . ALLERGIC RHINITIS 12/10/2006  . BENIGN PROSTATIC HYPERTROPHY 12/10/2006  . WEIGHT LOSS 12/10/2006  . GLUCOSE INTOLERANCE, MINIMAL, HX OF 12/10/2006  . NEPHROLITHIASIS, HX OF 12/10/2006  . RHINITIS, ALLERGIC NEC 12/09/2006  . Unspecified asthma(493.90) 12/09/2006  . COLONIC POLYPS 11/24/2006  . DIABETES MELLITUS, TYPE II 11/24/2006  . HYPERLIPIDEMIA 11/24/2006  . HYPERTENSION 11/24/2006  . GERD 11/24/2006  . Diverticulosis of colon (without mention of hemorrhage) 11/24/2006  . CALCULUS, KIDNEY 11/24/2006  . BENIGN PROSTATIC HYPERTROPHY, HX OF 11/24/2006   Current Outpatient Prescriptions on File Prior to Visit  Medication Sig Dispense Refill  . albuterol-ipratropium (COMBIVENT) 18-103 MCG/ACT inhaler Inhale 2 puffs into the lungs every 4 (four) hours.     Marland Kitchen aspirin EC 81 MG tablet Take 81 mg by mouth daily.    Marland Kitchen atorvastatin (LIPITOR) 40 MG tablet Take 40 mg by mouth daily.    . clopidogrel (PLAVIX) 75 MG tablet Take 1 tablet (75 mg total) by mouth daily with breakfast. 30 tablet 11  .  metoprolol tartrate (LOPRESSOR) 25 MG tablet Take 1 tablet (25 mg total) by mouth 2 (two) times daily.    Marland Kitchen OVER THE COUNTER MEDICATION Take 2 tablets by mouth every 4 (four) hours as needed (for shortness of breath, Lung Clear).    Marland Kitchen spironolactone (ALDACTONE) 25 MG tablet Take 25 mg by mouth daily.    Marland Kitchen sulfamethoxazole-trimethoprim (BACTRIM DS,SEPTRA DS) 800-160 MG tablet Take 1 tablet by mouth 2 (two) times daily. 14 tablet 0  . valsartan-hydrochlorothiazide (DIOVAN-HCT) 160-25 MG per tablet Take 1 tablet by mouth daily.    . [DISCONTINUED] levalbuterol (XOPENEX) 0.63 MG/3ML nebulizer solution Take 3 mLs (0.63 mg total) by nebulization 3 (three) times daily. 3 mL 12   No current facility-administered medications on file prior to visit.    No Known Allergies  Recent Results (from the past 2160 hour(s))  POCT CBC     Status: Abnormal   Collection Time: 04/09/16 10:08 AM  Result Value Ref Range   WBC 7.0 4.6 - 10.2 K/uL   Lymph, poc 1.3 0.6 - 3.4   POC LYMPH PERCENT 18.5 10 - 50 %L   MID (cbc) 0.7 0 - 0.9   POC MID % 9.3 0 - 12 %M   POC Granulocyte 5.1 2 - 6.9   Granulocyte percent 72.2 37 - 80 %G   RBC 5.14 4.69 - 6.13 M/uL   Hemoglobin 15.9 14.1 - 18.1 g/dL   HCT, POC 44.5 43.5 - 53.7 %   MCV 86.6 80 - 97 fL   MCH, POC 30.9 27 - 31.2 pg   MCHC 35.7 (A) 31.8 - 35.4 g/dL   RDW, POC 15.4 %  Platelet Count, POC 159 142 - 424 K/uL   MPV 7.7 0 - 99.8 fL  POCT glycosylated hemoglobin (Hb A1C)     Status: None   Collection Time: 04/09/16 10:12 AM  Result Value Ref Range   Hemoglobin A1C 6.5     Objective: General: Patient is awake, alert, and oriented x 3 and in no acute distress.  Integument: Skin is warm, dry and supple bilateral. Nails are tender, long, thickened and dystrophic with subungual debris, consistent with onychomycosis, 1-5 bilateral. No signs of infection. + preulcerative lesion sub met 1 on right with mild dry heme, Remaining integument  unremarkable.  Vasculature:  Dorsalis Pedis pulse 1/4 bilateral. Posterior Tibial pulse  1/4 bilateral. Capillary fill time <3 sec 1-5 bilateral. Scant hair growth to the level of the digits.Temperature gradient within normal limits. No varicosities present bilateral. No edema present bilateral.   Neurology: The patient has intact sensation measured with a 5.07/10g Semmes Weinstein Monofilament at all pedal sites bilateral . Vibratory sensation diminished bilateral with tuning fork. No Babinski sign present bilateral.   Musculoskeletal: Prominent 1st and 5th metatarsal heads R>L  noted bilateral. Muscular strength 5/5 in all lower extremity muscular groups bilateral without pain on range of motion . No tenderness with calf compression bilateral.  Xray right foot- No fracture, diffuse arthritis, calcaneal spur, no other acute findings.   Assessment and Plan: Problem List Items Addressed This Visit    None    Visit Diagnoses    Foot pain, right    -  Primary   Relevant Orders   DG Foot 2 Views Right   Dermatophytosis of nail       Pre-ulcerative calluses       Diabetes mellitus without complication ([place])          -Examined patient. -Xray right foot reviewed -Discussed and educated patient on diabetic foot care, especially with  regards to the vascular, neurological and musculoskeletal systems.  -Stressed the importance of good glycemic control and the detriment of not  controlling glucose levels in relation to the foot. -Mechanically debrided preulcerative callus x 1 on right using sterile chisel blade and all nails 1-5 bilateral using sterile nail nipper and filed with dremel without incident  -Answered all patient questions -Patient to return  in 2.5 months for at risk foot care -Patient advised to call the office if any problems or questions arise in the meantime.  Landis Martins, DPM

## 2016-04-10 LAB — COMPREHENSIVE METABOLIC PANEL
ALBUMIN: 4.3 g/dL (ref 3.5–4.7)
ALK PHOS: 62 IU/L (ref 39–117)
ALT: 24 IU/L (ref 0–44)
AST: 24 IU/L (ref 0–40)
Albumin/Globulin Ratio: 1.9 (ref 1.2–2.2)
BUN/Creatinine Ratio: 15 (ref 10–24)
BUN: 18 mg/dL (ref 8–27)
Bilirubin Total: 1 mg/dL (ref 0.0–1.2)
CO2: 22 mmol/L (ref 18–29)
CREATININE: 1.17 mg/dL (ref 0.76–1.27)
Calcium: 9.4 mg/dL (ref 8.6–10.2)
Chloride: 98 mmol/L (ref 96–106)
GFR calc Af Amer: 65 mL/min/{1.73_m2} (ref >59)
GFR calc non Af Amer: 57 mL/min/{1.73_m2} — ABNORMAL LOW (ref >59)
GLUCOSE: 107 mg/dL — AB (ref 65–99)
Globulin, Total: 2.3 g/dL (ref 1.5–4.5)
Potassium: 4.6 mmol/L (ref 3.5–5.2)
Sodium: 137 mmol/L (ref 134–144)
Total Protein: 6.6 g/dL (ref 6.0–8.5)

## 2016-04-15 ENCOUNTER — Encounter: Payer: Self-pay | Admitting: Family Medicine

## 2016-04-15 NOTE — Progress Notes (Signed)
April 15, 2016   [name] [name] [address] [place] [address]   Dear Mr. Payment,  Below are the results from your recent visit were as expected.  Resulted Orders  Comprehensive metabolic panel  Result Value Ref Range   Glucose 107 (H) 65 - 99 mg/dL   BUN 18 8 - 27 mg/dL   Creatinine, Ser 1.17 0.76 - 1.27 mg/dL   GFR calc non Af Amer 57 (L) >59 mL/min/1.73   GFR calc Af Amer 65 >59 mL/min/1.73   BUN/Creatinine Ratio 15 10 - 24   Sodium 137 134 - 144 mmol/L   Potassium 4.6 3.5 - 5.2 mmol/L   Chloride 98 96 - 106 mmol/L   CO2 22 18 - 29 mmol/L   Calcium 9.4 8.6 - 10.2 mg/dL   Total Protein 6.6 6.0 - 8.5 g/dL   Albumin 4.3 3.5 - 4.7 g/dL   Globulin, Total 2.3 1.5 - 4.5 g/dL   Albumin/Globulin Ratio 1.9 1.2 - 2.2   Bilirubin Total 1.0 0.0 - 1.2 mg/dL   Alkaline Phosphatase 62 39 - 117 IU/L   AST 24 0 - 40 IU/L   ALT 24 0 - 44 IU/L   Narrative   Performed at:  [address] [place], [place]  [id number] Lab Director: Lindon Romp MD, Phone:  [phone]     If you have any questions or concerns, please don't hesitate to call.  Sincerely,   Molli Barrows, FNP

## 2016-05-09 ENCOUNTER — Encounter (HOSPITAL_BASED_OUTPATIENT_CLINIC_OR_DEPARTMENT_OTHER): Payer: Medicare Other | Attending: Internal Medicine

## 2016-05-09 DIAGNOSIS — I10 Essential (primary) hypertension: Secondary | ICD-10-CM | POA: Diagnosis not present

## 2016-05-09 DIAGNOSIS — E11621 Type 2 diabetes mellitus with foot ulcer: Secondary | ICD-10-CM | POA: Diagnosis not present

## 2016-05-09 DIAGNOSIS — E1142 Type 2 diabetes mellitus with diabetic polyneuropathy: Secondary | ICD-10-CM | POA: Diagnosis not present

## 2016-05-09 DIAGNOSIS — I2510 Atherosclerotic heart disease of native coronary artery without angina pectoris: Secondary | ICD-10-CM | POA: Diagnosis not present

## 2016-05-09 DIAGNOSIS — F419 Anxiety disorder, unspecified: Secondary | ICD-10-CM | POA: Insufficient documentation

## 2016-05-09 DIAGNOSIS — L97511 Non-pressure chronic ulcer of other part of right foot limited to breakdown of skin: Secondary | ICD-10-CM | POA: Insufficient documentation

## 2016-05-09 DIAGNOSIS — J45909 Unspecified asthma, uncomplicated: Secondary | ICD-10-CM | POA: Diagnosis not present

## 2016-05-16 DIAGNOSIS — J45909 Unspecified asthma, uncomplicated: Secondary | ICD-10-CM | POA: Diagnosis not present

## 2016-05-16 DIAGNOSIS — I10 Essential (primary) hypertension: Secondary | ICD-10-CM | POA: Diagnosis not present

## 2016-05-16 DIAGNOSIS — L97511 Non-pressure chronic ulcer of other part of right foot limited to breakdown of skin: Secondary | ICD-10-CM | POA: Diagnosis not present

## 2016-05-16 DIAGNOSIS — E11621 Type 2 diabetes mellitus with foot ulcer: Secondary | ICD-10-CM | POA: Diagnosis not present

## 2016-05-16 DIAGNOSIS — I2510 Atherosclerotic heart disease of native coronary artery without angina pectoris: Secondary | ICD-10-CM | POA: Diagnosis not present

## 2016-05-16 DIAGNOSIS — S91301D Unspecified open wound, right foot, subsequent encounter: Secondary | ICD-10-CM | POA: Diagnosis not present

## 2016-05-16 DIAGNOSIS — E1142 Type 2 diabetes mellitus with diabetic polyneuropathy: Secondary | ICD-10-CM | POA: Diagnosis not present

## 2016-06-03 DIAGNOSIS — I10 Essential (primary) hypertension: Secondary | ICD-10-CM | POA: Diagnosis not present

## 2016-06-03 DIAGNOSIS — I255 Ischemic cardiomyopathy: Secondary | ICD-10-CM | POA: Diagnosis not present

## 2016-06-03 DIAGNOSIS — I2510 Atherosclerotic heart disease of native coronary artery without angina pectoris: Secondary | ICD-10-CM | POA: Diagnosis not present

## 2016-06-03 DIAGNOSIS — E7800 Pure hypercholesterolemia, unspecified: Secondary | ICD-10-CM | POA: Diagnosis not present

## 2016-06-11 ENCOUNTER — Ambulatory Visit (INDEPENDENT_AMBULATORY_CARE_PROVIDER_SITE_OTHER): Payer: Medicare Other | Admitting: Sports Medicine

## 2016-06-11 DIAGNOSIS — E119 Type 2 diabetes mellitus without complications: Secondary | ICD-10-CM

## 2016-06-11 DIAGNOSIS — B351 Tinea unguium: Secondary | ICD-10-CM

## 2016-06-11 DIAGNOSIS — L84 Corns and callosities: Secondary | ICD-10-CM

## 2016-06-11 NOTE — Progress Notes (Addendum)
Subjective: [name] [name] is a 83 y.o. male patient with history of diabetes who presents to office today complaining of long, painful nails while ambulating in shoes; unable to trim and callus. States that the right foot callus opened up and he had to put PRISMA pad on it as given by wound care doctor. Patient states that the glucose reading this morning was not recorded.Patient denies any new changes in medication or new problems. Patient denies any new cramping, numbness, burning or tingling in the legs.  Patient is also interested in diabetic shoes.   Patient Active Problem List   Diagnosis Date Noted  . S/P PTCA (percutaneous transluminal coronary angioplasty) 12/28/2013  . CAD (coronary artery disease), native coronary artery 12/26/2013  . Postsurgical percutaneous transluminal coronary angioplasty (PTCA) status 11/25/2013  . ACUTE ATOPIC CONJUNCTIVITIS 01/08/2007  . ACUTE BRONCHITIS 01/08/2007  . INSOMNIA-SLEEP DISORDER-UNSPEC 12/19/2006  . ANXIETY 12/10/2006  . DEPRESSION 12/10/2006  . TINNITUS, CHRONIC 12/10/2006  . ALLERGIC RHINITIS 12/10/2006  . BENIGN PROSTATIC HYPERTROPHY 12/10/2006  . WEIGHT LOSS 12/10/2006  . GLUCOSE INTOLERANCE, MINIMAL, HX OF 12/10/2006  . NEPHROLITHIASIS, HX OF 12/10/2006  . RHINITIS, ALLERGIC NEC 12/09/2006  . Unspecified asthma(493.90) 12/09/2006  . COLONIC POLYPS 11/24/2006  . DIABETES MELLITUS, TYPE II 11/24/2006  . HYPERLIPIDEMIA 11/24/2006  . HYPERTENSION 11/24/2006  . GERD 11/24/2006  . Diverticulosis of colon (without mention of hemorrhage) 11/24/2006  . CALCULUS, KIDNEY 11/24/2006  . BENIGN PROSTATIC HYPERTROPHY, HX OF 11/24/2006   Current Outpatient Prescriptions on File Prior to Visit  Medication Sig Dispense Refill  . albuterol-ipratropium (COMBIVENT) 18-103 MCG/ACT inhaler Inhale 2 puffs into the lungs every 4 (four) hours.     Marland Kitchen aspirin EC 81 MG tablet Take 81 mg by mouth daily.    Marland Kitchen atorvastatin (LIPITOR) 40 MG tablet Take 40  mg by mouth daily.    . clopidogrel (PLAVIX) 75 MG tablet Take 1 tablet (75 mg total) by mouth daily with breakfast. 30 tablet 11  . metoprolol tartrate (LOPRESSOR) 25 MG tablet Take 1 tablet (25 mg total) by mouth 2 (two) times daily.    Marland Kitchen OVER THE COUNTER MEDICATION Take 2 tablets by mouth every 4 (four) hours as needed (for shortness of breath, Lung Clear).    Marland Kitchen spironolactone (ALDACTONE) 25 MG tablet Take 25 mg by mouth daily.    Marland Kitchen sulfamethoxazole-trimethoprim (BACTRIM DS,SEPTRA DS) 800-160 MG tablet Take 1 tablet by mouth 2 (two) times daily. 14 tablet 0  . valsartan-hydrochlorothiazide (DIOVAN-HCT) 160-25 MG per tablet Take 1 tablet by mouth daily.    . [DISCONTINUED] levalbuterol (XOPENEX) 0.63 MG/3ML nebulizer solution Take 3 mLs (0.63 mg total) by nebulization 3 (three) times daily. 3 mL 12   No current facility-administered medications on file prior to visit.    No Known Allergies  Recent Results (from the past 2160 hour(s))  Comprehensive metabolic panel     Status: Abnormal   Collection Time: 04/09/16  9:58 AM  Result Value Ref Range   Glucose 107 (H) 65 - 99 mg/dL   BUN 18 8 - 27 mg/dL   Creatinine, Ser 1.17 0.76 - 1.27 mg/dL   GFR calc non Af Amer 57 (L) >59 mL/min/1.73   GFR calc Af Amer 65 >59 mL/min/1.73   BUN/Creatinine Ratio 15 10 - 24   Sodium 137 134 - 144 mmol/L   Potassium 4.6 3.5 - 5.2 mmol/L   Chloride 98 96 - 106 mmol/L   CO2 22 18 - 29 mmol/L   Calcium  9.4 8.6 - 10.2 mg/dL   Total Protein 6.6 6.0 - 8.5 g/dL   Albumin 4.3 3.5 - 4.7 g/dL   Globulin, Total 2.3 1.5 - 4.5 g/dL   Albumin/Globulin Ratio 1.9 1.2 - 2.2   Bilirubin Total 1.0 0.0 - 1.2 mg/dL   Alkaline Phosphatase 62 39 - 117 IU/L   AST 24 0 - 40 IU/L   ALT 24 0 - 44 IU/L  POCT CBC     Status: Abnormal   Collection Time: 04/09/16 10:08 AM  Result Value Ref Range   WBC 7.0 4.6 - 10.2 K/uL   Lymph, poc 1.3 0.6 - 3.4   POC LYMPH PERCENT 18.5 10 - 50 %L   MID (cbc) 0.7 0 - 0.9   POC MID % 9.3 0  - 12 %M   POC Granulocyte 5.1 2 - 6.9   Granulocyte percent 72.2 37 - 80 %G   RBC 5.14 4.69 - 6.13 M/uL   Hemoglobin 15.9 14.1 - 18.1 g/dL   HCT, POC 44.5 43.5 - 53.7 %   MCV 86.6 80 - 97 fL   MCH, POC 30.9 27 - 31.2 pg   MCHC 35.7 (A) 31.8 - 35.4 g/dL   RDW, POC 15.4 %   Platelet Count, POC 159 142 - 424 K/uL   MPV 7.7 0 - 99.8 fL  POCT glycosylated hemoglobin (Hb A1C)     Status: None   Collection Time: 04/09/16 10:12 AM  Result Value Ref Range   Hemoglobin A1C 6.5     Objective: General: Patient is awake, alert, and oriented x 3 and in no acute distress.  Integument: Skin is warm, dry and supple bilateral. Nails are tender, long, thickened and dystrophic with subungual debris, consistent with onychomycosis, 1-5 bilateral. No signs of infection. + preulcerative lesion sub met 1 on right with resolved dry heme and callus sub met 2& 5 on left, Remaining integument unremarkable.  Vasculature:  Dorsalis Pedis pulse 1/4 bilateral. Posterior Tibial pulse  1/4 bilateral. Capillary fill time <3 sec 1-5 bilateral. Scant hair growth to the level of the digits.Temperature gradient within normal limits. No varicosities present bilateral. No edema present bilateral.   Neurology: The patient has intact sensation measured with a 5.07/10g Semmes Weinstein Monofilament at all pedal sites bilateral. Vibratory sensation diminished bilateral with tuning fork. No Babinski sign present bilateral.   Musculoskeletal: Prominent 1st and 5th metatarsal heads R>L  noted bilateral. Muscular strength 5/5 in all lower extremity muscular groups bilateral without pain on range of motion. No tenderness with calf compression bilateral.  Assessment and Plan: Problem List Items Addressed This Visit    None    Visit Diagnoses    Dermatophytosis of nail    -  Primary   Pre-ulcerative calluses       Diabetes mellitus without complication ([place])          -Examined patient. -Discussed and educated patient on  diabetic foot care, especially with  regards to the vascular, neurological and musculoskeletal systems.  -Stressed the importance of good glycemic control and the detriment of not  controlling glucose levels in relation to the foot. -Mechanically debrided preulcerative callus x 3 using sterile chisel blade and all nails 1-5 bilateral using sterile nail nipper and filed with dremel without incident  -Answered all patient questions -Patient would like to think about getting diabetic shoes and will let us know what he decides at next visit; Patient returned to office and was casted and picked out diabetic shoes  on 06-17-16. [employer] paperwork completed on 07-09-16 and will be sent to Dr. Moreen Fowler for certification. -Patient to return  in 2.5 to 3 months for at risk foot care -Patient advised to call the office if any problems or questions arise in the meantime.  Landis Martins, DPM

## 2016-07-02 DIAGNOSIS — R7309 Other abnormal glucose: Secondary | ICD-10-CM | POA: Diagnosis not present

## 2016-07-03 ENCOUNTER — Ambulatory Visit: Payer: Medicare Other

## 2016-07-09 DIAGNOSIS — R945 Abnormal results of liver function studies: Secondary | ICD-10-CM | POA: Diagnosis not present

## 2016-07-09 DIAGNOSIS — B351 Tinea unguium: Secondary | ICD-10-CM | POA: Diagnosis not present

## 2016-07-09 DIAGNOSIS — I10 Essential (primary) hypertension: Secondary | ICD-10-CM | POA: Diagnosis not present

## 2016-07-09 DIAGNOSIS — E1169 Type 2 diabetes mellitus with other specified complication: Secondary | ICD-10-CM | POA: Diagnosis not present

## 2016-07-09 DIAGNOSIS — E7800 Pure hypercholesterolemia, unspecified: Secondary | ICD-10-CM | POA: Diagnosis not present

## 2016-07-09 DIAGNOSIS — I255 Ischemic cardiomyopathy: Secondary | ICD-10-CM | POA: Diagnosis not present

## 2016-07-09 DIAGNOSIS — I2510 Atherosclerotic heart disease of native coronary artery without angina pectoris: Secondary | ICD-10-CM | POA: Diagnosis not present

## 2016-07-09 DIAGNOSIS — L853 Xerosis cutis: Secondary | ICD-10-CM | POA: Diagnosis not present

## 2016-07-09 DIAGNOSIS — J45909 Unspecified asthma, uncomplicated: Secondary | ICD-10-CM | POA: Diagnosis not present

## 2016-07-09 DIAGNOSIS — Z0000 Encounter for general adult medical examination without abnormal findings: Secondary | ICD-10-CM | POA: Diagnosis not present

## 2016-07-09 DIAGNOSIS — Z1389 Encounter for screening for other disorder: Secondary | ICD-10-CM | POA: Diagnosis not present

## 2016-07-12 DIAGNOSIS — R7989 Other specified abnormal findings of blood chemistry: Secondary | ICD-10-CM | POA: Diagnosis not present

## 2016-07-16 DIAGNOSIS — R945 Abnormal results of liver function studies: Secondary | ICD-10-CM | POA: Diagnosis not present

## 2016-08-07 ENCOUNTER — Other Ambulatory Visit: Payer: Medicare Other

## 2016-08-07 ENCOUNTER — Ambulatory Visit (INDEPENDENT_AMBULATORY_CARE_PROVIDER_SITE_OTHER): Payer: Medicare Other | Admitting: Sports Medicine

## 2016-08-07 DIAGNOSIS — E119 Type 2 diabetes mellitus without complications: Secondary | ICD-10-CM | POA: Diagnosis not present

## 2016-08-07 DIAGNOSIS — M7750 Other enthesopathy of unspecified foot: Secondary | ICD-10-CM

## 2016-08-07 DIAGNOSIS — M79671 Pain in right foot: Secondary | ICD-10-CM

## 2016-08-07 DIAGNOSIS — L84 Corns and callosities: Secondary | ICD-10-CM | POA: Diagnosis not present

## 2016-08-13 NOTE — Progress Notes (Signed)
Patient came in today to pick up diabetic shoes and custom inserts.  Same was well pleased with fit and function.   The foot ortheses offered full contact with plantar surface and contoured the arch well.   The shoes fit well with no heel slippage and areas of pressure concern.   Patient advised to contact us if any problems arise. 

## 2016-08-14 NOTE — Progress Notes (Signed)
Patient discussed with Pedorthist. Agree with below. Patient to follow up as scheduled for continued care or sooner if problems or issues arise. -Dr. Cannon Kettle

## 2016-08-20 DIAGNOSIS — Z13220 Encounter for screening for lipoid disorders: Secondary | ICD-10-CM | POA: Diagnosis not present

## 2016-08-20 DIAGNOSIS — Z136 Encounter for screening for cardiovascular disorders: Secondary | ICD-10-CM | POA: Diagnosis not present

## 2016-09-24 ENCOUNTER — Ambulatory Visit (INDEPENDENT_AMBULATORY_CARE_PROVIDER_SITE_OTHER): Payer: Medicare Other | Admitting: Sports Medicine

## 2016-09-24 ENCOUNTER — Encounter: Payer: Self-pay | Admitting: Sports Medicine

## 2016-09-24 DIAGNOSIS — E119 Type 2 diabetes mellitus without complications: Secondary | ICD-10-CM | POA: Diagnosis not present

## 2016-09-24 DIAGNOSIS — B351 Tinea unguium: Secondary | ICD-10-CM | POA: Diagnosis not present

## 2016-09-24 DIAGNOSIS — L84 Corns and callosities: Secondary | ICD-10-CM | POA: Diagnosis not present

## 2016-09-24 DIAGNOSIS — M79672 Pain in left foot: Secondary | ICD-10-CM

## 2016-09-24 DIAGNOSIS — M79676 Pain in unspecified toe(s): Secondary | ICD-10-CM | POA: Diagnosis not present

## 2016-09-24 DIAGNOSIS — M79671 Pain in right foot: Secondary | ICD-10-CM

## 2016-09-24 NOTE — Progress Notes (Signed)
Subjective: [name] [name] is a 83 y.o. male patient with history of diabetes who presents to office today complaining of callus and long, painful nails while ambulating in shoes; unable to trim. Patient states that the glucose reading this morning was not recorded. Patient denies any new changes in medication or new problems.  Patient is wearing diabetic shoes.   Patient Active Problem List   Diagnosis Date Noted  . S/P PTCA (percutaneous transluminal coronary angioplasty) 12/28/2013  . CAD (coronary artery disease), native coronary artery 12/26/2013  . Postsurgical percutaneous transluminal coronary angioplasty (PTCA) status 11/25/2013  . ACUTE ATOPIC CONJUNCTIVITIS 01/08/2007  . ACUTE BRONCHITIS 01/08/2007  . INSOMNIA-SLEEP DISORDER-UNSPEC 12/19/2006  . ANXIETY 12/10/2006  . DEPRESSION 12/10/2006  . TINNITUS, CHRONIC 12/10/2006  . ALLERGIC RHINITIS 12/10/2006  . BENIGN PROSTATIC HYPERTROPHY 12/10/2006  . WEIGHT LOSS 12/10/2006  . GLUCOSE INTOLERANCE, MINIMAL, HX OF 12/10/2006  . NEPHROLITHIASIS, HX OF 12/10/2006  . RHINITIS, ALLERGIC NEC 12/09/2006  . Unspecified asthma(493.90) 12/09/2006  . COLONIC POLYPS 11/24/2006  . DIABETES MELLITUS, TYPE II 11/24/2006  . HYPERLIPIDEMIA 11/24/2006  . HYPERTENSION 11/24/2006  . GERD 11/24/2006  . Diverticulosis of colon (without mention of hemorrhage) 11/24/2006  . CALCULUS, KIDNEY 11/24/2006  . BENIGN PROSTATIC HYPERTROPHY, HX OF 11/24/2006   Current Outpatient Prescriptions on File Prior to Visit  Medication Sig Dispense Refill  . albuterol-ipratropium (COMBIVENT) 18-103 MCG/ACT inhaler Inhale 2 puffs into the lungs every 4 (four) hours.     Marland Kitchen aspirin EC 81 MG tablet Take 81 mg by mouth daily.    Marland Kitchen atorvastatin (LIPITOR) 40 MG tablet Take 40 mg by mouth daily.    . clopidogrel (PLAVIX) 75 MG tablet Take 1 tablet (75 mg total) by mouth daily with breakfast. 30 tablet 11  . metoprolol tartrate (LOPRESSOR) 25 MG tablet Take 1 tablet (25  mg total) by mouth 2 (two) times daily.    Marland Kitchen OVER THE COUNTER MEDICATION Take 2 tablets by mouth every 4 (four) hours as needed (for shortness of breath, Lung Clear).    Marland Kitchen spironolactone (ALDACTONE) 25 MG tablet Take 25 mg by mouth daily.    Marland Kitchen sulfamethoxazole-trimethoprim (BACTRIM DS,SEPTRA DS) 800-160 MG tablet Take 1 tablet by mouth 2 (two) times daily. 14 tablet 0  . valsartan-hydrochlorothiazide (DIOVAN-HCT) 160-25 MG per tablet Take 1 tablet by mouth daily.    . [DISCONTINUED] levalbuterol (XOPENEX) 0.63 MG/3ML nebulizer solution Take 3 mLs (0.63 mg total) by nebulization 3 (three) times daily. 3 mL 12   No current facility-administered medications on file prior to visit.    No Known Allergies  No results found for this or any previous visit (from the past 2160 hour(s)).  Objective: General: Patient is awake, alert, and oriented x 3 and in no acute distress.  Integument: Skin is warm, dry and supple bilateral. Nails are tender, long, thickened and dystrophic with subungual debris, consistent with onychomycosis, 1-5 bilateral. No signs of infection. + preulcerative lesion sub met 1 on right with resolved dry heme and callus sub met 2& 5 on left, Remaining integument unremarkable.  Vasculature:  Dorsalis Pedis pulse 1/4 bilateral. Posterior Tibial pulse  1/4 bilateral. Capillary fill time <3 sec 1-5 bilateral. Scant hair growth to the level of the digits.Temperature gradient within normal limits. No varicosities present bilateral. No edema present bilateral.   Neurology: The patient has intact sensation measured with a 5.07/10g Semmes Weinstein Monofilament at all pedal sites bilateral. Vibratory sensation diminished bilateral with tuning fork. No Babinski sign present bilateral.  Musculoskeletal: Prominent 1st and 5th metatarsal heads R>L  noted bilateral. Muscular strength 5/5 in all lower extremity muscular groups bilateral without pain on range of motion. No tenderness with calf  compression bilateral.  Assessment and Plan: Problem List Items Addressed This Visit    None    Visit Diagnoses    Dermatophytosis of nail    -  Primary   Pre-ulcerative calluses       Diabetes mellitus without complication ([facility])       Foot pain, bilateral         -Examined patient. -Discussed and educated patient on diabetic foot care, especially with  regards to the vascular, neurological and musculoskeletal systems.  -Stressed the importance of good glycemic control and the detriment of not  controlling glucose levels in relation to the foot. -Mechanically debrided preulcerative callus x 3 using sterile chisel blade and all nails 1-5 bilateral using sterile nail nipper and filed with dremel without incident  -Continue with diabetic shoes -Answered all patient questions -Patient to return  in 2.5 to 3 months for at risk foot care -Patient advised to call the office if any problems or questions arise in the meantime.  Landis Martins, DPM

## 2016-10-10 DIAGNOSIS — M9903 Segmental and somatic dysfunction of lumbar region: Secondary | ICD-10-CM | POA: Diagnosis not present

## 2016-10-10 DIAGNOSIS — M9904 Segmental and somatic dysfunction of sacral region: Secondary | ICD-10-CM | POA: Diagnosis not present

## 2016-10-10 DIAGNOSIS — M5136 Other intervertebral disc degeneration, lumbar region: Secondary | ICD-10-CM | POA: Diagnosis not present

## 2016-10-10 DIAGNOSIS — M9905 Segmental and somatic dysfunction of pelvic region: Secondary | ICD-10-CM | POA: Diagnosis not present

## 2016-10-11 ENCOUNTER — Encounter: Payer: Self-pay | Admitting: Family Medicine

## 2016-10-11 ENCOUNTER — Ambulatory Visit (INDEPENDENT_AMBULATORY_CARE_PROVIDER_SITE_OTHER): Payer: Medicare Other

## 2016-10-11 ENCOUNTER — Ambulatory Visit (INDEPENDENT_AMBULATORY_CARE_PROVIDER_SITE_OTHER): Payer: Medicare Other | Admitting: Family Medicine

## 2016-10-11 VITALS — BP 171/76 | HR 74 | Temp 97.3°F | Resp 16 | Ht 67.72 in | Wt 212.4 lb

## 2016-10-11 DIAGNOSIS — R339 Retention of urine, unspecified: Secondary | ICD-10-CM

## 2016-10-11 DIAGNOSIS — Z87442 Personal history of urinary calculi: Secondary | ICD-10-CM

## 2016-10-11 DIAGNOSIS — N401 Enlarged prostate with lower urinary tract symptoms: Secondary | ICD-10-CM

## 2016-10-11 DIAGNOSIS — K5900 Constipation, unspecified: Secondary | ICD-10-CM | POA: Diagnosis not present

## 2016-10-11 DIAGNOSIS — K5901 Slow transit constipation: Secondary | ICD-10-CM | POA: Diagnosis not present

## 2016-10-11 DIAGNOSIS — M545 Low back pain, unspecified: Secondary | ICD-10-CM

## 2016-10-11 DIAGNOSIS — R3911 Hesitancy of micturition: Secondary | ICD-10-CM

## 2016-10-11 DIAGNOSIS — N200 Calculus of kidney: Secondary | ICD-10-CM | POA: Diagnosis not present

## 2016-10-11 LAB — POCT URINALYSIS DIP (MANUAL ENTRY)
BILIRUBIN UA: NEGATIVE
BILIRUBIN UA: NEGATIVE mg/dL
Glucose, UA: NEGATIVE mg/dL
LEUKOCYTES UA: NEGATIVE
Nitrite, UA: NEGATIVE
PH UA: 7.5 (ref 5.0–8.0)
Protein Ur, POC: NEGATIVE mg/dL
Spec Grav, UA: <=1.005 — AB (ref 1.010–1.025)
Urobilinogen, UA: 0.2 E.U./dL

## 2016-10-11 LAB — POCT CBC
GRANULOCYTE PERCENT: 70.4 % (ref 37–80)
HEMATOCRIT: 46.5 % (ref 43.5–53.7)
Hemoglobin: 15.8 g/dL (ref 14.1–18.1)
LYMPH, POC: 1.3 (ref 0.6–3.4)
MCH, POC: 29.5 pg (ref 27–31.2)
MCHC: 34.1 g/dL (ref 31.8–35.4)
MCV: 86.6 fL (ref 80–97)
MID (cbc): 0.2 (ref 0–0.9)
MPV: 7.9 fL (ref 0–99.8)
POC Granulocyte: 3.6 (ref 2–6.9)
POC LYMPH %: 25.3 % (ref 10–50)
POC MID %: 4.3 %M (ref 0–12)
Platelet Count, POC: 182 10*3/uL (ref 142–424)
RBC: 5.37 M/uL (ref 4.69–6.13)
RDW, POC: 17.1 %
WBC: 5.1 10*3/uL (ref 4.6–10.2)

## 2016-10-11 LAB — POC MICROSCOPIC URINALYSIS (UMFC): Mucus: ABSENT

## 2016-10-11 MED ORDER — CIPROFLOXACIN HCL 250 MG PO TABS: 250.0000 mg | ORAL_TABLET | Freq: Two times a day (BID) | ORAL | 0 refills | Status: DC

## 2016-10-11 MED ORDER — TAMSULOSIN HCL 0.4 MG PO CAPS: 0.4000 mg | ORAL_CAPSULE | Freq: Every day | ORAL | 1 refills | Status: DC

## 2016-10-11 NOTE — Patient Instructions (Addendum)
  DRINK WATER INSTEAD OF SWEET TEA. START TAKING FLOMAX 0.4MG  ONE TABLET EVERY MORNING TO HELP WITH URINATION. START TAKING CIPRO 250MG  ONE TABLET TWICE DAILY FOR PROSTATE INFECTION. START TAKING COLACE 100MG  ONE TABLET TWICE DAILY (GENERIC) FOR CONSTIPATION.     IF you received an x-ray today, you will receive an invoice from [facility]. Please contact [facility] at [phone] with questions or concerns regarding your invoice.   IF you received labwork today, you will receive an invoice from [facility]. Please contact [facility] at [phone] with questions or concerns regarding your invoice.   Our billing staff will not be able to assist you with questions regarding bills from these companies.  You will be contacted with the lab results as soon as they are available. The fastest way to get your results is to activate your My Chart account. Instructions are located on the last page of this paperwork. If you have not heard from [place] regarding the results in 2 weeks, please contact this office.

## 2016-10-11 NOTE — Progress Notes (Signed)
Subjective:    Patient ID: [name] [name], male    DOB: 21-Feb-1929, 83 y.o.   MRN: [id number]  10/11/2016  Back Pain (X 1 mth) and Urinary Retention   HPI This 83 y.o. male presents for evaluation of back pain and urination retention onset one month ago.   Has been dribbling with urine for the past month.  Urinating helps with low back pain.   R flank pain.  Has had several kidney stones in the past; calcium oxalates; drinking sweet teas and ice cream.  No fever/cihlls/sweats.  No nausea or vomiting.  R flank pain improved after chiropractor but returned today.  Stiffness upon standing in lower back B and general.  Stabbing pain in back also.  No dysuria; dribbles with urination.  BPH with TURP in 2008.  Surrel performed.  Nocturia x 2-3; baseline 2-3 for three years.  Stream is weaker.  When gets pain in back, pain is relieved.  No blood in urine.  Urine is more yellowish.  Bowel movements will get big and tiny.  Constipation started several months ago.  Last bowel movement small stools.  Back pain and difficulties urinating seems related.  Urine more yellow.  Pain is worsening.  Followed by PCP every six months.  Last colonoscopy 2008 [facility]  Urology cannot see for two weeks.  Cannot hardly urinate.  Had prostate surgery in 2008. Went to see a chiropractor yesterday.   Constipated most of the time alternating intermittently with diarrhea.  Has neuropathy in feet due to DMII.  Stopped Lipitor due to elevated LFTs but cardiology placed on other medications; had two stents placed by cardiology. Repeat LFTs improved yet slightly elevated still.     Has not been taking anything for constipation or urinary issues.  Not sexual activity.   BP Readings from Last 3 Encounters:  10/11/16 (!) 171/76  04/09/16 118/64  12/19/15 132/80   Wt Readings from Last 3 Encounters:  10/11/16 212 lb 6.4 oz (96.3 kg)  04/09/16 222 lb (100.7 kg)  12/19/15 218 lb (98.9 kg)   Immunization History    Administered Date(s) Administered  . Influenza, High Dose Seasonal PF 12/04/2012  . Influenza-Unspecified 12/12/2015  . Pneumococcal Conjugate-13 09/28/2013  . Tdap 04/13/2014    Review of Systems  Constitutional: Negative for activity change, appetite change, chills, diaphoresis, fatigue and fever.  Respiratory: Negative for cough and shortness of breath.   Cardiovascular: Negative for chest pain, palpitations and leg swelling.  Gastrointestinal: Positive for constipation. Negative for abdominal pain, diarrhea, nausea and vomiting.  Endocrine: Negative for cold intolerance, heat intolerance, polydipsia, polyphagia and polyuria.  Genitourinary: Positive for decreased urine volume, difficulty urinating, flank pain and frequency. Negative for discharge, dysuria, enuresis, genital sores, hematuria, penile pain, penile swelling, scrotal swelling, testicular pain and urgency.  Musculoskeletal: Positive for back pain.  Skin: Negative for color change, rash and wound.  Neurological: Negative for dizziness, tremors, seizures, syncope, facial asymmetry, speech difficulty, weakness, light-headedness, numbness and headaches.  Psychiatric/Behavioral: Negative for dysphoric mood and sleep disturbance. The patient is not nervous/anxious.     Past Medical History:  Diagnosis Date  . Anxiety   . Asthma    "all my life"  . BPH (benign prostatic hyperplasia)    "even now; after my OR" (12/28/2013)  . CHF (congestive heart failure) ([place])   . Chronic bronchitis ([place])    "often"  . Coronary artery disease   . Depression   . Diverticulosis of colon (without mention of  hemorrhage)   . GERD (gastroesophageal reflux disease)   . Hiatal hernia   . High cholesterol   . Hypertension   . Kidney stone    "I've had 7 over the years" (12/28/2013)  . Pneumonia 1930's  . Rhinitis    Past Surgical History:  Procedure Laterality Date  . COLONOSCOPY W/ BIOPSIES AND POLYPECTOMY    . CORONARY ANGIOPLASTY WITH  STENT PLACEMENT  11/25/2013; 12/28/2013   "1; ?2"  . CYSTOSCOPY W/ LITHOLAPAXY / EHL  X 2  . INGUINAL HERNIA REPAIR Left 1962  . INGUINAL HERNIA REPAIR Right 1980  . LEFT HEART CATHETERIZATION WITH CORONARY ANGIOGRAM N/A 11/25/2013   Procedure: LEFT HEART CATHETERIZATION WITH CORONARY ANGIOGRAM;  Surgeon: Laverda Page, MD;  Location: [facility] CATH LAB;  Service: Cardiovascular;  Laterality: N/A;  . PERCUTANEOUS CORONARY STENT INTERVENTION (PCI-S)  11/25/2013   Procedure: PERCUTANEOUS CORONARY STENT INTERVENTION (PCI-S);  Surgeon: Laverda Page, MD;  Location: [facility] CATH LAB;  Service: Cardiovascular;;  . PERCUTANEOUS CORONARY STENT INTERVENTION (PCI-S) N/A 12/28/2013   Procedure: PERCUTANEOUS CORONARY STENT INTERVENTION (PCI-S);  Surgeon: Laverda Page, MD;  Location: [facility] CATH LAB;  Service: Cardiovascular;  Laterality: N/A;  . PROSTATE SURGERY    . TONSILLECTOMY  1937  . TRANSURETHRAL RESECTION OF PROSTATE  ~ 2007   No Known Allergies  Social History   Social History  . Marital status: Single    Spouse name: N/A  . Number of children: N/A  . Years of education: N/A   Occupational History  . Not on file.   Social History Main Topics  . Smoking status: Former Research scientist (life sciences)  . Smokeless tobacco: Never Used     Comment: "quit smoking in my 20's"  . Alcohol use Yes     Comment: "stopped drinking in the 1970's"  . Drug use: No  . Sexual activity: No   Other Topics Concern  . Not on file   Social History Narrative  . No narrative on file   Family History  Problem Relation Age of Onset  . Heart disease Mother   . Stroke Mother   . Heart disease Father   . Diabetes Brother        Objective:    BP (!) 171/76 (BP Location: Right Arm, Patient Position: Sitting, Cuff Size: Large)   Pulse 74   Temp (!) 97.3 F (36.3 C) (Oral)   Resp 16   Ht 5' 7.72" (1.72 m)   Wt 212 lb 6.4 oz (96.3 kg)   SpO2 95%   BMI 32.57 kg/m  Physical Exam  Constitutional: He is oriented to person,  place, and time. He appears well-developed and well-nourished. No distress.  HENT:  Head: Normocephalic and atraumatic.  Right Ear: External ear normal.  Left Ear: External ear normal.  Nose: Nose normal.  Mouth/Throat: Oropharynx is clear and moist.  Eyes: Pupils are equal, round, and reactive to light. Conjunctivae and EOM are normal.  Neck: Normal range of motion. Neck supple. Carotid bruit is not present. No thyromegaly present.  Cardiovascular: Normal rate, regular rhythm, normal heart sounds and intact distal pulses.  Exam reveals no gallop and no friction rub.   No murmur heard. Pulmonary/Chest: Effort normal and breath sounds normal. He has no wheezes. He has no rales.  Abdominal: Soft. Bowel sounds are normal. He exhibits no distension and no mass. There is no tenderness. There is no rebound and no guarding.  Musculoskeletal:       Right shoulder: He exhibits decreased  range of motion and pain. He exhibits no tenderness, no bony tenderness, no spasm, normal pulse and normal strength.  Lymphadenopathy:    He has no cervical adenopathy.  Neurological: He is alert and oriented to person, place, and time. No cranial nerve deficit.  Skin: Skin is warm and dry. No rash noted. He is not diaphoretic.  Psychiatric: He has a normal mood and affect. His behavior is normal.  Nursing note and vitals reviewed.  Results for orders placed or performed in visit on 10/11/16  POCT urinalysis dipstick  Result Value Ref Range   Color, UA yellow yellow   Clarity, UA clear clear   Glucose, UA negative negative mg/dL   Bilirubin, UA negative negative   Ketones, POC UA negative negative mg/dL   Spec Grav, UA <=1.005 (A) 1.010 - 1.025   Blood, UA trace-lysed (A) negative   pH, UA 7.5 5.0 - 8.0   Protein Ur, POC negative negative mg/dL   Urobilinogen, UA 0.2 0.2 or 1.0 E.U./dL   Nitrite, UA Negative Negative   Leukocytes, UA Negative Negative  POCT Microscopic Urinalysis ([facility])  Result Value Ref  Range   WBC,UR,HPF,POC None None WBC/hpf   RBC,UR,HPF,POC None None RBC/hpf   Bacteria Few (A) None, Too numerous to count   Mucus Absent Absent   Epithelial Cells, UR Per Microscopy None None, Too numerous to count cells/hpf  POCT CBC  Result Value Ref Range   WBC 5.1 4.6 - 10.2 K/uL   Lymph, poc 1.3 0.6 - 3.4   POC LYMPH PERCENT 25.3 10 - 50 %L   MID (cbc) 0.2 0 - 0.9   POC MID % 4.3 0 - 12 %M   POC Granulocyte 3.6 2 - 6.9   Granulocyte percent 70.4 37 - 80 %G   RBC 5.37 4.69 - 6.13 M/uL   Hemoglobin 15.8 14.1 - 18.1 g/dL   HCT, POC 46.5 43.5 - 53.7 %   MCV 86.6 80 - 97 fL   MCH, POC 29.5 27 - 31.2 pg   MCHC 34.1 31.8 - 35.4 g/dL   RDW, POC 17.1 %   Platelet Count, POC 182 142 - 424 K/uL   MPV 7.9 0 - 99.8 fL   Dg Abd 2 Views  Result Date: 10/11/2016 CLINICAL DATA:  Constipation, urinary retention, back pain, history hypertension, diverticulosis, CHF, asthma, GERD EXAM: ABDOMEN - 2 VIEW COMPARISON:  CT abdomen pelvis 03/19/2007 FINDINGS: RIGHT renal calculi, largest 8 mm calculus at upper pole RIGHT kidney. No other definite urinary tract calcifications. Bowel gas pattern normal. No bowel dilatation or bowel wall thickening. No free intraperitoneal air. Bones demineralized with degenerative changes of the lumbar spine. IMPRESSION: Nonspecific bowel gas pattern. Nonobstructing RIGHT renal calculi. Electronically Signed   By: Lavonia Dana M.D.   On: 10/11/2016 17:24   Depression screen [facility] 2/9 10/11/2016 04/09/2016 12/19/2015 12/16/2015  Decreased Interest 0 0 0 0  Down, Depressed, Hopeless 0 0 0 0  PHQ - 2 Score 0 0 0 0   Fall Risk  10/11/2016 04/09/2016 12/19/2015 12/16/2015  Falls in the past year? No No No No        Assessment & Plan:   1. Urinary retention   2. Acute bilateral low back pain without sciatica   3. Slow transit constipation   4. History of nephrolithiasis    -urinary retention with history of BPH s/p TURP; recurrent symptoms in the past month; send urine  cx; treat empirically for acute prostatitis.  Has upcoming  appointment with urology. -initiate Flomax while awaiting urology appointment. -obtain labs.  -RTC for inability to urinate; should urinate once every eight hours.  Will warrant foley catheter if unable to achieve three episodes of urination in 24 hour period; pt expressed understanding. -initiate Colace bid for constipation.   Orders Placed This Encounter  Procedures  . Urine Culture  . DG Abd 2 Views    Standing Status:   Future    Number of Occurrences:   1    Standing Expiration Date:   10/11/2017    Order Specific Question:   Reason for Exam (SYMPTOM  OR DIAGNOSIS REQUIRED)    Answer:   constipation, urinary retention, back pain    Order Specific Question:   Preferred imaging location?    Answer:   External  . Comprehensive metabolic panel  . PSA  . POCT urinalysis dipstick  . POCT Microscopic Urinalysis ([facility])  . POCT CBC   Meds ordered this encounter  Medications  . tamsulosin (FLOMAX) 0.4 MG CAPS capsule    Sig: Take 1 capsule (0.4 mg total) by mouth daily.    Dispense:  30 capsule    Refill:  1  . ciprofloxacin (CIPRO) 250 MG tablet    Sig: Take 1 tablet (250 mg total) by mouth 2 (two) times daily.    Dispense:  30 tablet    Refill:  0    No Follow-up on file.   Almas Rake Elayne Guerin, M.D. Primary Care at [facility] [address] [place], [place]  [address] [phone] phone [phone] fax

## 2016-10-12 LAB — COMPREHENSIVE METABOLIC PANEL
A/G RATIO: 1.7 (ref 1.2–2.2)
ALT: 31 IU/L (ref 0–44)
AST: 23 IU/L (ref 0–40)
Albumin: 4.5 g/dL (ref 3.5–4.7)
Alkaline Phosphatase: 68 IU/L (ref 39–117)
BILIRUBIN TOTAL: 0.7 mg/dL (ref 0.0–1.2)
BUN/Creatinine Ratio: 12 (ref 10–24)
BUN: 11 mg/dL (ref 8–27)
CALCIUM: 9.3 mg/dL (ref 8.6–10.2)
CHLORIDE: 102 mmol/L (ref 96–106)
CO2: 21 mmol/L (ref 20–29)
Creatinine, Ser: 0.91 mg/dL (ref 0.76–1.27)
GFR, EST AFRICAN AMERICAN: 88 mL/min/{1.73_m2} (ref >59)
GFR, EST NON AFRICAN AMERICAN: 76 mL/min/{1.73_m2} (ref >59)
GLOBULIN, TOTAL: 2.6 g/dL (ref 1.5–4.5)
Glucose: 102 mg/dL — ABNORMAL HIGH (ref 65–99)
POTASSIUM: 4.5 mmol/L (ref 3.5–5.2)
SODIUM: 140 mmol/L (ref 134–144)
Total Protein: 7.1 g/dL (ref 6.0–8.5)

## 2016-10-12 LAB — PSA: Prostate Specific Ag, Serum: 0.9 ng/mL (ref 0.0–4.0)

## 2016-10-13 LAB — URINE CULTURE

## 2016-10-16 DIAGNOSIS — M47816 Spondylosis without myelopathy or radiculopathy, lumbar region: Secondary | ICD-10-CM | POA: Diagnosis not present

## 2016-10-17 DIAGNOSIS — M9903 Segmental and somatic dysfunction of lumbar region: Secondary | ICD-10-CM | POA: Diagnosis not present

## 2016-10-17 DIAGNOSIS — M9904 Segmental and somatic dysfunction of sacral region: Secondary | ICD-10-CM | POA: Diagnosis not present

## 2016-10-17 DIAGNOSIS — M9905 Segmental and somatic dysfunction of pelvic region: Secondary | ICD-10-CM | POA: Diagnosis not present

## 2016-10-17 DIAGNOSIS — M5136 Other intervertebral disc degeneration, lumbar region: Secondary | ICD-10-CM | POA: Diagnosis not present

## 2016-10-25 DIAGNOSIS — N401 Enlarged prostate with lower urinary tract symptoms: Secondary | ICD-10-CM | POA: Diagnosis not present

## 2016-10-25 DIAGNOSIS — N200 Calculus of kidney: Secondary | ICD-10-CM | POA: Diagnosis not present

## 2016-10-25 DIAGNOSIS — R350 Frequency of micturition: Secondary | ICD-10-CM | POA: Diagnosis not present

## 2016-12-24 ENCOUNTER — Ambulatory Visit: Payer: Medicare Other | Admitting: Sports Medicine

## 2016-12-31 ENCOUNTER — Ambulatory Visit: Payer: Medicare Other | Admitting: Sports Medicine

## 2017-01-07 DIAGNOSIS — N401 Enlarged prostate with lower urinary tract symptoms: Secondary | ICD-10-CM | POA: Diagnosis not present

## 2017-01-07 DIAGNOSIS — R351 Nocturia: Secondary | ICD-10-CM | POA: Diagnosis not present

## 2017-01-14 ENCOUNTER — Encounter: Payer: Self-pay | Admitting: Sports Medicine

## 2017-01-14 ENCOUNTER — Ambulatory Visit (INDEPENDENT_AMBULATORY_CARE_PROVIDER_SITE_OTHER): Payer: Medicare Other | Admitting: Sports Medicine

## 2017-01-14 DIAGNOSIS — M79671 Pain in right foot: Secondary | ICD-10-CM

## 2017-01-14 DIAGNOSIS — M79672 Pain in left foot: Secondary | ICD-10-CM | POA: Diagnosis not present

## 2017-01-14 DIAGNOSIS — B351 Tinea unguium: Secondary | ICD-10-CM

## 2017-01-14 DIAGNOSIS — E119 Type 2 diabetes mellitus without complications: Secondary | ICD-10-CM | POA: Diagnosis not present

## 2017-01-14 NOTE — Progress Notes (Signed)
Subjective: [name] [name] is a 83 y.o. male patient with history of diabetes who presents to office today complaining of callus and long, painful nails while ambulating in shoes; unable to trim. Patient states that the glucose reading this morning was not recorded. Patient denies any new changes in medication or new problems.  Patient Active Problem List   Diagnosis Date Noted  . S/P PTCA (percutaneous transluminal coronary angioplasty) 12/28/2013  . CAD (coronary artery disease), native coronary artery 12/26/2013  . Postsurgical percutaneous transluminal coronary angioplasty (PTCA) status 11/25/2013  . ACUTE ATOPIC CONJUNCTIVITIS 01/08/2007  . ACUTE BRONCHITIS 01/08/2007  . INSOMNIA-SLEEP DISORDER-UNSPEC 12/19/2006  . ANXIETY 12/10/2006  . DEPRESSION 12/10/2006  . TINNITUS, CHRONIC 12/10/2006  . ALLERGIC RHINITIS 12/10/2006  . BENIGN PROSTATIC HYPERTROPHY 12/10/2006  . WEIGHT LOSS 12/10/2006  . GLUCOSE INTOLERANCE, MINIMAL, HX OF 12/10/2006  . NEPHROLITHIASIS, HX OF 12/10/2006  . RHINITIS, ALLERGIC NEC 12/09/2006  . Unspecified asthma(493.90) 12/09/2006  . COLONIC POLYPS 11/24/2006  . DIABETES MELLITUS, TYPE II 11/24/2006  . HYPERLIPIDEMIA 11/24/2006  . HYPERTENSION 11/24/2006  . GERD 11/24/2006  . Diverticulosis of colon (without mention of hemorrhage) 11/24/2006  . CALCULUS, KIDNEY 11/24/2006  . BENIGN PROSTATIC HYPERTROPHY, HX OF 11/24/2006   Current Outpatient Medications on File Prior to Visit  Medication Sig Dispense Refill  . albuterol-ipratropium (COMBIVENT) 18-103 MCG/ACT inhaler Inhale 2 puffs into the lungs every 4 (four) hours.     Marland Kitchen aspirin EC 81 MG tablet Take 81 mg by mouth daily.    Marland Kitchen atorvastatin (LIPITOR) 40 MG tablet Take 40 mg by mouth daily.    . ciprofloxacin (CIPRO) 250 MG tablet Take 1 tablet (250 mg total) by mouth 2 (two) times daily. 30 tablet 0  . clopidogrel (PLAVIX) 75 MG tablet Take 1 tablet (75 mg total) by mouth daily with breakfast. 30 tablet  11  . metoprolol tartrate (LOPRESSOR) 25 MG tablet Take 1 tablet (25 mg total) by mouth 2 (two) times daily.    Marland Kitchen OVER THE COUNTER MEDICATION Take 2 tablets by mouth every 4 (four) hours as needed (for shortness of breath, Lung Clear).    Marland Kitchen spironolactone (ALDACTONE) 25 MG tablet Take 25 mg by mouth daily.    . tamsulosin (FLOMAX) 0.4 MG CAPS capsule Take 1 capsule (0.4 mg total) by mouth daily. 30 capsule 1  . valsartan-hydrochlorothiazide (DIOVAN-HCT) 160-25 MG per tablet Take 1 tablet by mouth daily.    . [DISCONTINUED] levalbuterol (XOPENEX) 0.63 MG/3ML nebulizer solution Take 3 mLs (0.63 mg total) by nebulization 3 (three) times daily. 3 mL 12   No current facility-administered medications on file prior to visit.    No Known Allergies  No results found for this or any previous visit (from the past 2160 hour(s)).  Objective: General: Patient is awake, alert, and oriented x 3 and in no acute distress.  Integument: Skin is warm, dry and supple bilateral. Nails are tender, long, thickened and dystrophic with subungual debris, consistent with onychomycosis, 1-5 bilateral. No signs of infection. + minimal/preulcerative lesion sub met 1 on right with resolved dry heme and callus sub met 2& 5 on left, Remaining integument unremarkable.  Vasculature:  Dorsalis Pedis pulse 1/4 bilateral. Posterior Tibial pulse  1/4 bilateral. Capillary fill time <3 sec 1-5 bilateral. Scant hair growth to the level of the digits.Temperature gradient within normal limits. No varicosities present bilateral. No edema present bilateral.   Neurology: The patient has intact sensation measured with a 5.07/10g Semmes Weinstein Monofilament at all pedal  sites bilateral. Vibratory sensation diminished bilateral with tuning fork. No Babinski sign present bilateral.   Musculoskeletal: Prominent 1st and 5th metatarsal heads R>L  noted bilateral. Muscular strength 5/5 in all lower extremity muscular groups bilateral without pain  on range of motion. No tenderness with calf compression bilateral.  Assessment and Plan: Problem List Items Addressed This Visit    None    Visit Diagnoses    Dermatophytosis of nail    -  Primary   Diabetes mellitus without complication (HCC)       Foot pain, bilateral         -Examined patient. -Discussed and educated patient on diabetic foot care, especially with  regards to the vascular, neurological and musculoskeletal systems.  -Stressed the importance of good glycemic control and the detriment of not  controlling glucose levels in relation to the foot. -Mechanically debrided all nails 1-5 bilateral using sterile nail nipper and filed with dremel without incident  -No callus trim today, very minimal  -Continue with diabetic shoes -Answered all patient questions -Patient to return  in 3 months for at risk foot care -Patient advised to call the office if any problems or questions arise in the meantime.   , DPM 

## 2017-01-20 DIAGNOSIS — E1169 Type 2 diabetes mellitus with other specified complication: Secondary | ICD-10-CM | POA: Diagnosis not present

## 2017-01-20 DIAGNOSIS — B351 Tinea unguium: Secondary | ICD-10-CM | POA: Diagnosis not present

## 2017-01-20 DIAGNOSIS — I2510 Atherosclerotic heart disease of native coronary artery without angina pectoris: Secondary | ICD-10-CM | POA: Diagnosis not present

## 2017-01-20 DIAGNOSIS — J45909 Unspecified asthma, uncomplicated: Secondary | ICD-10-CM | POA: Diagnosis not present

## 2017-01-20 DIAGNOSIS — E7800 Pure hypercholesterolemia, unspecified: Secondary | ICD-10-CM | POA: Diagnosis not present

## 2017-01-20 DIAGNOSIS — I255 Ischemic cardiomyopathy: Secondary | ICD-10-CM | POA: Diagnosis not present

## 2017-01-20 DIAGNOSIS — I10 Essential (primary) hypertension: Secondary | ICD-10-CM | POA: Diagnosis not present

## 2017-01-20 DIAGNOSIS — R945 Abnormal results of liver function studies: Secondary | ICD-10-CM | POA: Diagnosis not present

## 2017-01-20 DIAGNOSIS — Z23 Encounter for immunization: Secondary | ICD-10-CM | POA: Diagnosis not present

## 2017-03-03 DIAGNOSIS — I2510 Atherosclerotic heart disease of native coronary artery without angina pectoris: Secondary | ICD-10-CM | POA: Diagnosis not present

## 2017-03-03 DIAGNOSIS — E7800 Pure hypercholesterolemia, unspecified: Secondary | ICD-10-CM | POA: Diagnosis not present

## 2017-03-03 DIAGNOSIS — I10 Essential (primary) hypertension: Secondary | ICD-10-CM | POA: Diagnosis not present

## 2017-03-03 DIAGNOSIS — I255 Ischemic cardiomyopathy: Secondary | ICD-10-CM | POA: Diagnosis not present

## 2017-04-29 ENCOUNTER — Encounter: Payer: Self-pay | Admitting: Sports Medicine

## 2017-04-29 ENCOUNTER — Ambulatory Visit (INDEPENDENT_AMBULATORY_CARE_PROVIDER_SITE_OTHER): Payer: Medicare Other | Admitting: Sports Medicine

## 2017-04-29 DIAGNOSIS — B351 Tinea unguium: Secondary | ICD-10-CM | POA: Diagnosis not present

## 2017-04-29 DIAGNOSIS — M79672 Pain in left foot: Secondary | ICD-10-CM | POA: Diagnosis not present

## 2017-04-29 DIAGNOSIS — M79671 Pain in right foot: Secondary | ICD-10-CM

## 2017-04-29 DIAGNOSIS — E119 Type 2 diabetes mellitus without complications: Secondary | ICD-10-CM

## 2017-04-29 NOTE — Progress Notes (Signed)
Subjective: [name] [name] is a 84 y.o. male patient with history of diabetes who presents to office today complaining of callus and long, painful nails while ambulating in shoes; unable to trim. Patient states that the glucose reading this morning was not recorded. A1c 5.9. Patient denies any new changes in medication or new problems.  Patient Active Problem List   Diagnosis Date Noted  . S/P PTCA (percutaneous transluminal coronary angioplasty) 12/28/2013  . CAD (coronary artery disease), native coronary artery 12/26/2013  . Postsurgical percutaneous transluminal coronary angioplasty (PTCA) status 11/25/2013  . ACUTE ATOPIC CONJUNCTIVITIS 01/08/2007  . ACUTE BRONCHITIS 01/08/2007  . INSOMNIA-SLEEP DISORDER-UNSPEC 12/19/2006  . ANXIETY 12/10/2006  . DEPRESSION 12/10/2006  . TINNITUS, CHRONIC 12/10/2006  . ALLERGIC RHINITIS 12/10/2006  . BENIGN PROSTATIC HYPERTROPHY 12/10/2006  . WEIGHT LOSS 12/10/2006  . GLUCOSE INTOLERANCE, MINIMAL, HX OF 12/10/2006  . NEPHROLITHIASIS, HX OF 12/10/2006  . RHINITIS, ALLERGIC NEC 12/09/2006  . Unspecified asthma(493.90) 12/09/2006  . COLONIC POLYPS 11/24/2006  . DIABETES MELLITUS, TYPE II 11/24/2006  . HYPERLIPIDEMIA 11/24/2006  . HYPERTENSION 11/24/2006  . GERD 11/24/2006  . Diverticulosis of colon (without mention of hemorrhage) 11/24/2006  . CALCULUS, KIDNEY 11/24/2006  . BENIGN PROSTATIC HYPERTROPHY, HX OF 11/24/2006   Current Outpatient Medications on File Prior to Visit  Medication Sig Dispense Refill  . albuterol-ipratropium (COMBIVENT) 18-103 MCG/ACT inhaler Inhale 2 puffs into the lungs every 4 (four) hours.     Marland Kitchen aspirin EC 81 MG tablet Take 81 mg by mouth daily.    Marland Kitchen atorvastatin (LIPITOR) 40 MG tablet Take 40 mg by mouth daily.    . ciprofloxacin (CIPRO) 250 MG tablet Take 1 tablet (250 mg total) by mouth 2 (two) times daily. 30 tablet 0  . clopidogrel (PLAVIX) 75 MG tablet Take 1 tablet (75 mg total) by mouth daily with breakfast.  30 tablet 11  . metoprolol tartrate (LOPRESSOR) 25 MG tablet Take 1 tablet (25 mg total) by mouth 2 (two) times daily.    Marland Kitchen OVER THE COUNTER MEDICATION Take 2 tablets by mouth every 4 (four) hours as needed (for shortness of breath, Lung Clear).    Marland Kitchen spironolactone (ALDACTONE) 25 MG tablet Take 25 mg by mouth daily.    . tamsulosin (FLOMAX) 0.4 MG CAPS capsule Take 1 capsule (0.4 mg total) by mouth daily. 30 capsule 1  . valsartan-hydrochlorothiazide (DIOVAN-HCT) 160-25 MG per tablet Take 1 tablet by mouth daily.    . [DISCONTINUED] levalbuterol (XOPENEX) 0.63 MG/3ML nebulizer solution Take 3 mLs (0.63 mg total) by nebulization 3 (three) times daily. 3 mL 12   No current facility-administered medications on file prior to visit.    No Known Allergies  No results found for this or any previous visit (from the past 2160 hour(s)).  Objective: General: Patient is awake, alert, and oriented x 3 and in no acute distress.  Integument: Skin is warm, dry and supple bilateral. Nails are tender, long, thickened and dystrophic with subungual debris, consistent with onychomycosis, 1-5 bilateral. No signs of infection. + minimal/preulcerative lesion sub met 1 on right, Remaining integument unremarkable.  Vasculature:  Dorsalis Pedis pulse 1/4 bilateral. Posterior Tibial pulse  1/4 bilateral. Capillary fill time <3 sec 1-5 bilateral. Scant hair growth to the level of the digits.Temperature gradient within normal limits. No varicosities present bilateral. No edema present bilateral.   Neurology: The patient has intact sensation measured with a 5.07/10g Semmes Weinstein Monofilament at all pedal sites bilateral. Vibratory sensation diminished bilateral with tuning fork. No  Babinski sign present bilateral.   Musculoskeletal: Prominent 1st and 5th metatarsal heads R>L  noted bilateral. Muscular strength 5/5 in all lower extremity muscular groups bilateral without pain on range of motion. No tenderness with calf  compression bilateral.  Assessment and Plan: Problem List Items Addressed This Visit    None    Visit Diagnoses    Dermatophytosis of nail    -  Primary   Diabetes mellitus without complication (HCC)       Foot pain, bilateral         -Examined patient. -Discussed and educated patient on diabetic foot care, especially with  regards to the vascular, neurological and musculoskeletal systems.  -Stressed the importance of good glycemic control and the detriment of not  controlling glucose levels in relation to the foot. -Mechanically debrided all nails 1-5 bilateral using sterile nail nipper and filed with dremel without incident  -No callus trim today, very minimal as previous -ABN signed  -Continue with diabetic shoes -Answered all patient questions -Patient to return  in 3 months for at risk foot care -Patient advised to call the office if any problems or questions arise in the meantime.  Landis Martins, DPM

## 2017-05-12 ENCOUNTER — Encounter: Payer: Self-pay | Admitting: Physician Assistant

## 2017-05-12 ENCOUNTER — Ambulatory Visit (INDEPENDENT_AMBULATORY_CARE_PROVIDER_SITE_OTHER): Payer: Medicare Other | Admitting: Physician Assistant

## 2017-05-12 ENCOUNTER — Other Ambulatory Visit: Payer: Self-pay

## 2017-05-12 VITALS — BP 144/72 | HR 74 | Temp 98.2°F | Resp 16 | Ht 67.0 in | Wt 215.4 lb

## 2017-05-12 DIAGNOSIS — R21 Rash and other nonspecific skin eruption: Secondary | ICD-10-CM

## 2017-05-12 MED ORDER — MUPIROCIN 2 % EX OINT: 1.0000 "application " | TOPICAL_OINTMENT | Freq: Two times a day (BID) | CUTANEOUS | 0 refills | Status: DC

## 2017-05-12 MED ORDER — CEPHALEXIN 500 MG PO CAPS: 500.0000 mg | ORAL_CAPSULE | Freq: Two times a day (BID) | ORAL | 0 refills | Status: AC

## 2017-05-12 NOTE — Patient Instructions (Signed)
     IF you received an x-ray today, you will receive an invoice from [facility]. Please contact [facility] at [phone] with questions or concerns regarding your invoice.   IF you received labwork today, you will receive an invoice from [facility]. Please contact [facility] at [phone] with questions or concerns regarding your invoice.   Our billing staff will not be able to assist you with questions regarding bills from these companies.  You will be contacted with the lab results as soon as they are available. The fastest way to get your results is to activate your My Chart account. Instructions are located on the last page of this paperwork. If you have not heard from us regarding the results in 2 weeks, please contact this office.     

## 2017-05-12 NOTE — Progress Notes (Signed)
05/12/2017 2:02 PM   DOB: [birth year] / MRN: [id number]  SUBJECTIVE:  [name] [name] is a 84 y.o. male presenting for a painful rash on his lateral left leg just proximal to the lateral malleolus.  He denies injury to this area.  States the rash started about 3 days ago and seems to get worse daily.  He associates pain about the rash.  He denies itching.  He has a history of diabetes which he tells me "the doctors real happy with the number now."  He denies fever, chills, nausea.  He is tried placing ointment on the rash and states this is not helped.  He has No Known Allergies.   He  has a past medical history of Anxiety, Asthma, BPH (benign prostatic hyperplasia), CHF (congestive heart failure) ([place]), Chronic bronchitis ([place]), Coronary artery disease, Depression, Diverticulosis of colon (without mention of hemorrhage), GERD (gastroesophageal reflux disease), Hiatal hernia, High cholesterol, Hypertension, Kidney stone, Pneumonia (1930's), and Rhinitis.    He  reports that he has quit smoking. he has never used smokeless tobacco. He reports that he drinks alcohol. He reports that he does not use drugs. He  reports that he does not engage in sexual activity. The patient  has a past surgical history that includes Inguinal hernia repair (Left, 1962); Inguinal hernia repair (Right, 1980); Colonoscopy w/ biopsies and polypectomy; Coronary angioplasty with stent (11/25/2013; 12/28/2013); Tonsillectomy (1937); Transurethral resection of prostate (~ 2007); Cystoscopy w/ litholapaxy / EHL (X 2); left heart catheterization with coronary angiogram (N/A, 11/25/2013); percutaneous coronary stent intervention (pci-s) (11/25/2013); percutaneous coronary stent intervention (pci-s) (N/A, 12/28/2013); and Prostate surgery.  His family history includes Diabetes in his brother; Heart disease in his father and mother; Stroke in his mother.  ROS per HPI  The problem list and medications were reviewed and updated by myself  where necessary and exist elsewhere in the encounter.   OBJECTIVE:  BP (!) 144/72   Pulse 74   Temp 98.2 F (36.8 C) (Oral)   Resp 16   Ht 5\' 7"  (1.702 m)   Wt 215 lb 6.4 oz (97.7 kg)   SpO2 96%   BMI 33.74 kg/m   Lab Results  Component Value Date   WBC 5.1 10/11/2016   HGB 15.8 10/11/2016   HCT 46.5 10/11/2016   MCV 86.6 10/11/2016   PLT 138 (L) 12/29/2013   Lab Results  Component Value Date   CREATININE 0.91 10/11/2016   BUN 11 10/11/2016   NA 140 10/11/2016   K 4.5 10/11/2016   CL 102 10/11/2016   CO2 21 10/11/2016   Lab Results  Component Value Date   HGBA1C 6.5 04/09/2016      Physical Exam  Constitutional: He appears well-developed. He is active and cooperative.  Non-toxic appearance.  Cardiovascular: Normal rate.  Pulmonary/Chest: Effort normal. No tachypnea.  Neurological: He is alert.  Skin: Skin is warm and dry. He is not diaphoretic. No pallor.     Vitals reviewed.   No results found for this or any previous visit (from the past 72 hour(s)).  No results found.  ASSESSMENT AND PLAN:  [name] [name] was seen today for rash.  Diagnoses and all orders for this visit:  Rash and nonspecific skin eruption: 84 year old male here today with well-controlled diabetes for a painful and red rash on his leg.  The rash is tender and erythematous.  Most likely an early cellulitis.  Starting him on a medium dose of Keflex.  Last renal function panel  was normal.  His never had C. difficile per his chart.  Starting him on Bactroban as well.  I will see him back in about 4 days for recheck. -     cephALEXin (KEFLEX) 500 MG capsule; Take 1 capsule (500 mg total) by mouth 2 (two) times daily for 10 days. -     mupirocin ointment (BACTROBAN) 2 %; Apply 1 application topically 2 (two) times daily. -     Care order/instruction:    The patient is advised to call or return to clinic if he does not see an improvement in symptoms, or to seek the care of the closest emergency  department if he worsens with the above plan.   Philis Fendt, MHS, PA-C [facility] 05/12/2017 2:02 PM

## 2017-05-16 ENCOUNTER — Ambulatory Visit (INDEPENDENT_AMBULATORY_CARE_PROVIDER_SITE_OTHER): Payer: Medicare Other | Admitting: Physician Assistant

## 2017-05-16 ENCOUNTER — Encounter: Payer: Self-pay | Admitting: Physician Assistant

## 2017-05-16 VITALS — BP 150/73 | HR 65 | Temp 97.7°F | Resp 17 | Ht 68.5 in | Wt 213.0 lb

## 2017-05-16 DIAGNOSIS — R21 Rash and other nonspecific skin eruption: Secondary | ICD-10-CM

## 2017-05-16 MED ORDER — CLOTRIMAZOLE 1 % EX OINT: 1.0000 "application " | TOPICAL_OINTMENT | Freq: Three times a day (TID) | CUTANEOUS | 1 refills | Status: DC

## 2017-05-16 NOTE — Patient Instructions (Addendum)
  I am glad you are having less pain about the rash.  Please complete the Keflex as prescribed.  Continue applying the mupirocin ointment and I have prescribed a fungal  ointment to be placed on the rash after the mupirocin ointment.   IF you received an x-ray today, you will receive an invoice from [facility]. Please contact [facility] at [phone] with questions or concerns regarding your invoice.   IF you received labwork today, you will receive an invoice from [facility]. Please contact [facility] at [phone] with questions or concerns regarding your invoice.   Our billing staff will not be able to assist you with questions regarding bills from these companies.  You will be contacted with the lab results as soon as they are available. The fastest way to get your results is to activate your My Chart account. Instructions are located on the last page of this paperwork. If you have not heard from [place] regarding the results in 2 weeks, please contact this office.

## 2017-05-16 NOTE — Progress Notes (Signed)
    05/16/2017 10:48 AM   DOB: Sep 02, 1928 / MRN: [id number]  SUBJECTIVE:  [name] [name] is a 84 y.o. male presenting for recheck of rash.  He tells me he is having less pain about the rash at this time.  Complains of continued redness.  Does associate some itching.  He has No Known Allergies.   He  has a past medical history of Anxiety, Asthma, BPH (benign prostatic hyperplasia), CHF (congestive heart failure) ([place]), Chronic bronchitis ([place]), Coronary artery disease, Depression, Diverticulosis of colon (without mention of hemorrhage), GERD (gastroesophageal reflux disease), Hiatal hernia, High cholesterol, Hypertension, Kidney stone, Pneumonia (1930's), and Rhinitis.    He  reports that he has quit smoking. He has never used smokeless tobacco. He reports that he drinks alcohol. He reports that he does not use drugs. He  reports that he does not engage in sexual activity. The patient  has a past surgical history that includes Inguinal hernia repair (Left, 1962); Inguinal hernia repair (Right, 1980); Colonoscopy w/ biopsies and polypectomy; Coronary angioplasty with stent (11/25/2013; 12/28/2013); Tonsillectomy (1937); Transurethral resection of prostate (~ 2007); Cystoscopy w/ litholapaxy / EHL (X 2); left heart catheterization with coronary angiogram (N/A, 11/25/2013); percutaneous coronary stent intervention (pci-s) (11/25/2013); percutaneous coronary stent intervention (pci-s) (N/A, 12/28/2013); and Prostate surgery.  His family history includes Diabetes in his brother; Heart disease in his father and mother; Stroke in his mother.  Review of Systems  Constitutional: Negative for chills, diaphoresis and fever.  Gastrointestinal: Negative for nausea.  Skin: Positive for itching and rash.  Neurological: Negative for dizziness.    The problem list and medications were reviewed and updated by myself where necessary and exist elsewhere in the encounter.   OBJECTIVE:  BP (!) 150/73   Pulse 65   Temp  97.7 F (36.5 C) (Oral)   Resp 17   Ht 5' 8.5" (1.74 m)   Wt 213 lb (96.6 kg)   SpO2 98%   BMI 31.92 kg/m   Physical Exam  Constitutional: He is active and cooperative.  Cardiovascular: Normal rate.  Pulmonary/Chest: Effort normal. No tachypnea.  Neurological: He is alert.  Skin: Skin is warm.  Vitals reviewed.   No results found for this or any previous visit (from the past 72 hour(s)).  No results found.  ASSESSMENT AND PLAN:  [name] [name] was seen today for follow-up.  Diagnoses and all orders for this visit:  Rash and nonspecific skin eruption  Other orders -     Clotrimazole 1 % OINT; Apply 1 application topically 3 (three) times daily.    The patient is advised to call or return to clinic if he does not see an improvement in symptoms, or to seek the care of the closest emergency department if he worsens with the above plan.   Philis Fendt, MHS, PA-C [facility] 05/16/2017 10:48 AM

## 2017-05-23 DIAGNOSIS — B354 Tinea corporis: Secondary | ICD-10-CM | POA: Diagnosis not present

## 2017-06-05 DIAGNOSIS — L308 Other specified dermatitis: Secondary | ICD-10-CM | POA: Diagnosis not present

## 2017-06-05 DIAGNOSIS — I872 Venous insufficiency (chronic) (peripheral): Secondary | ICD-10-CM | POA: Diagnosis not present

## 2017-06-19 DIAGNOSIS — B354 Tinea corporis: Secondary | ICD-10-CM | POA: Diagnosis not present

## 2017-07-02 DIAGNOSIS — I872 Venous insufficiency (chronic) (peripheral): Secondary | ICD-10-CM | POA: Diagnosis not present

## 2017-07-02 DIAGNOSIS — L820 Inflamed seborrheic keratosis: Secondary | ICD-10-CM | POA: Diagnosis not present

## 2017-07-23 ENCOUNTER — Encounter: Payer: Self-pay | Admitting: Family Medicine

## 2017-08-01 ENCOUNTER — Emergency Department (HOSPITAL_COMMUNITY)
Admission: EM | Admit: 2017-08-01 | Discharge: 2017-08-01 | Disposition: A | Discharge status: Home / Self Care | Payer: Medicare Other | Attending: Emergency Medicine | Admitting: Emergency Medicine

## 2017-08-01 ENCOUNTER — Emergency Department (HOSPITAL_COMMUNITY): Payer: Medicare Other

## 2017-08-01 ENCOUNTER — Encounter (HOSPITAL_COMMUNITY): Payer: Self-pay

## 2017-08-01 ENCOUNTER — Other Ambulatory Visit: Payer: Self-pay

## 2017-08-01 DIAGNOSIS — I509 Heart failure, unspecified: Secondary | ICD-10-CM | POA: Diagnosis not present

## 2017-08-01 DIAGNOSIS — I10 Essential (primary) hypertension: Secondary | ICD-10-CM | POA: Insufficient documentation

## 2017-08-01 DIAGNOSIS — K5900 Constipation, unspecified: Secondary | ICD-10-CM | POA: Insufficient documentation

## 2017-08-01 DIAGNOSIS — R112 Nausea with vomiting, unspecified: Secondary | ICD-10-CM | POA: Diagnosis not present

## 2017-08-01 DIAGNOSIS — N201 Calculus of ureter: Secondary | ICD-10-CM | POA: Diagnosis not present

## 2017-08-01 DIAGNOSIS — Z79899 Other long term (current) drug therapy: Secondary | ICD-10-CM | POA: Insufficient documentation

## 2017-08-01 DIAGNOSIS — I2510 Atherosclerotic heart disease of native coronary artery without angina pectoris: Secondary | ICD-10-CM | POA: Insufficient documentation

## 2017-08-01 DIAGNOSIS — E119 Type 2 diabetes mellitus without complications: Secondary | ICD-10-CM | POA: Insufficient documentation

## 2017-08-01 DIAGNOSIS — R1111 Vomiting without nausea: Secondary | ICD-10-CM | POA: Diagnosis not present

## 2017-08-01 DIAGNOSIS — R000 Tachycardia, unspecified: Secondary | ICD-10-CM | POA: Diagnosis not present

## 2017-08-01 DIAGNOSIS — R350 Frequency of micturition: Secondary | ICD-10-CM | POA: Diagnosis not present

## 2017-08-01 HISTORY — DX: Type 2 diabetes mellitus without complications: E11.9

## 2017-08-01 LAB — URINALYSIS, ROUTINE W REFLEX MICROSCOPIC
Bacteria, UA: NONE SEEN
Bilirubin Urine: NEGATIVE
GLUCOSE, UA: NEGATIVE mg/dL
KETONES UR: 20 mg/dL — AB
LEUKOCYTES UA: NEGATIVE
Nitrite: NEGATIVE
PH: 6 (ref 5.0–8.0)
PROTEIN: NEGATIVE mg/dL
Specific Gravity, Urine: >1.046 — ABNORMAL HIGH (ref 1.005–1.030)

## 2017-08-01 LAB — COMPREHENSIVE METABOLIC PANEL
ALK PHOS: 55 U/L (ref 38–126)
ALT: 45 U/L (ref 17–63)
AST: 34 U/L (ref 15–41)
Albumin: 4.4 g/dL (ref 3.5–5.0)
Anion gap: 12 (ref 5–15)
BILIRUBIN TOTAL: 1.8 mg/dL — AB (ref 0.3–1.2)
BUN: 14 mg/dL (ref 6–20)
CALCIUM: 9.1 mg/dL (ref 8.9–10.3)
CO2: 23 mmol/L (ref 22–32)
CREATININE: 0.88 mg/dL (ref 0.61–1.24)
Chloride: 103 mmol/L (ref 101–111)
Glucose, Bld: 104 mg/dL — ABNORMAL HIGH (ref 65–99)
Potassium: 4 mmol/L (ref 3.5–5.1)
Sodium: 138 mmol/L (ref 135–145)
Total Protein: 7 g/dL (ref 6.5–8.1)

## 2017-08-01 LAB — CBC
HCT: 44.1 % (ref 39.0–52.0)
Hemoglobin: 15 g/dL (ref 13.0–17.0)
MCH: 31.4 pg (ref 26.0–34.0)
MCHC: 34 g/dL (ref 30.0–36.0)
MCV: 92.3 fL (ref 78.0–100.0)
PLATELETS: 175 10*3/uL (ref 150–400)
RBC: 4.78 MIL/uL (ref 4.22–5.81)
RDW: 14.7 % (ref 11.5–15.5)
WBC: 4.7 10*3/uL (ref 4.0–10.5)

## 2017-08-01 LAB — LIPASE, BLOOD: Lipase: 52 U/L — ABNORMAL HIGH (ref 11–51)

## 2017-08-01 MED ORDER — IOPAMIDOL (ISOVUE-300) INJECTION 61%
INTRAVENOUS | Status: AC
  Filled 2017-08-01: qty 100

## 2017-08-01 MED ORDER — SODIUM CHLORIDE 0.9 % IV BOLUS
1000.0000 mL | Freq: Once | INTRAVENOUS | Status: AC
Start: 2017-08-01 — End: 2017-08-01
  Administered 2017-08-01: 1000 mL via INTRAVENOUS

## 2017-08-01 MED ORDER — ONDANSETRON HCL 4 MG/2ML IJ SOLN
4.0000 mg | Freq: Once | INTRAMUSCULAR | Status: AC
  Administered 2017-08-01: 4 mg via INTRAVENOUS
  Filled 2017-08-01: qty 2

## 2017-08-01 MED ORDER — ONDANSETRON 4 MG PO TBDP: ORAL_TABLET | ORAL | 0 refills | Status: DC

## 2017-08-01 MED ORDER — IOPAMIDOL (ISOVUE-300) INJECTION 61%
100.0000 mL | Freq: Once | INTRAVENOUS | Status: AC | PRN
  Administered 2017-08-01: 100 mL via INTRAVENOUS

## 2017-08-01 MED ORDER — SODIUM CHLORIDE 0.9 % IV BOLUS
500.0000 mL | Freq: Once | INTRAVENOUS | Status: AC
  Administered 2017-08-01: 500 mL via INTRAVENOUS

## 2017-08-01 NOTE — ED Notes (Signed)
Pt was given ice water for PO/fluid challenge---- tolerated well; pt denies nausea but did complain, "I'm burping a lot".

## 2017-08-01 NOTE — Discharge Instructions (Addendum)
Evaluation today is reassuring CT scan shows constipation without any other concerning problems with your stomach or bowels.  It does show some nonobstructing kidney stones.  Please take 8 capfuls of MiraLAX and mix them in 32 ounces of any liquid of your choosing and drink this over about 8 hours, this should help you move your bowels after that please use 1 capful of MiraLAX daily.  You may use Zofran as needed for nausea.  If you have fevers, chills, persistent nausea and vomiting and or unable to keep down food or fluids, abdominal pain, worsening constipation or blood in the stool or any other new or concerning symptoms please return to the ED for reevaluation otherwise please follow-up with your primary care doctor early next week for recheck.

## 2017-08-01 NOTE — ED Provider Notes (Signed)
[facility] DEPT Provider Note   CSN: [id number] Arrival date & time: 08/01/17  1557     History   Chief Complaint Chief Complaint  Patient presents with  . Emesis    x 3 days  . Constipation    x 1 week    HPI DETRAVION TESTER is a 84 y.o. male.  DVAUGHN FICKLE is a 84 y.o. Male with hx of HTN, asthma, DM, CAD, GERD, depression and anxiety, presents to the ED for evaluation of constipation, nausea and vomiting.  Patient reports he has not been able to have a bowel movement over the past week, prior to that he had normal stooling, with no melena or hematochezia.  Patient also reports over the past 3 days he is felt nauseous and vomited after eating and has not been able to keep much down aside from some oatmeal this morning.  Patient denies abdominal pain but reports in general he just does not feel quite well.  He denies fevers or chills.  No chest pain or shortness of breath.  Patient denies dysuria or frequency.  Patient has not tried anything to treat his constipation at home, has not followed up with his primary care doctor.  Patient denies prior abdominal surgeries, does report history of a hiatal hernia.     Past Medical History:  Diagnosis Date  . Anxiety   . Asthma    "all my life"  . BPH (benign prostatic hyperplasia)    "even now; after my OR" (12/28/2013)  . CHF (congestive heart failure) ([place])   . Chronic bronchitis ([place])    "often"  . Coronary artery disease   . Depression   . Diabetes mellitus without complication ([place])    Type II  . Diverticulosis of colon (without mention of hemorrhage)   . GERD (gastroesophageal reflux disease)   . Hiatal hernia   . High cholesterol   . Hypertension   . Kidney stone    "I've had 7 over the years" (12/28/2013)  . Pneumonia 1930's  . Rhinitis     Patient Active Problem List   Diagnosis Date Noted  . S/P PTCA (percutaneous transluminal coronary angioplasty) 12/28/2013  . CAD (coronary  artery disease), native coronary artery 12/26/2013  . Postsurgical percutaneous transluminal coronary angioplasty (PTCA) status 11/25/2013  . ACUTE ATOPIC CONJUNCTIVITIS 01/08/2007  . ACUTE BRONCHITIS 01/08/2007  . INSOMNIA-SLEEP DISORDER-UNSPEC 12/19/2006  . ANXIETY 12/10/2006  . DEPRESSION 12/10/2006  . TINNITUS, CHRONIC 12/10/2006  . ALLERGIC RHINITIS 12/10/2006  . BENIGN PROSTATIC HYPERTROPHY 12/10/2006  . WEIGHT LOSS 12/10/2006  . GLUCOSE INTOLERANCE, MINIMAL, HX OF 12/10/2006  . NEPHROLITHIASIS, HX OF 12/10/2006  . RHINITIS, ALLERGIC NEC 12/09/2006  . Unspecified asthma(493.90) 12/09/2006  . COLONIC POLYPS 11/24/2006  . DIABETES MELLITUS, TYPE II 11/24/2006  . HYPERLIPIDEMIA 11/24/2006  . HYPERTENSION 11/24/2006  . GERD 11/24/2006  . Diverticulosis of colon (without mention of hemorrhage) 11/24/2006  . CALCULUS, KIDNEY 11/24/2006  . BENIGN PROSTATIC HYPERTROPHY, HX OF 11/24/2006    Past Surgical History:  Procedure Laterality Date  . COLONOSCOPY W/ BIOPSIES AND POLYPECTOMY    . CORONARY ANGIOPLASTY WITH STENT PLACEMENT  11/25/2013; 12/28/2013   "1; ?2"  . CYSTOSCOPY W/ LITHOLAPAXY / EHL  X 2  . INGUINAL HERNIA REPAIR Left 1962  . INGUINAL HERNIA REPAIR Right 1980  . LEFT HEART CATHETERIZATION WITH CORONARY ANGIOGRAM N/A 11/25/2013   Procedure: LEFT HEART CATHETERIZATION WITH CORONARY ANGIOGRAM;  Surgeon: Laverda Page, MD;  Location: [facility] CATH  LAB;  Service: Cardiovascular;  Laterality: N/A;  . PERCUTANEOUS CORONARY STENT INTERVENTION (PCI-S)  11/25/2013   Procedure: PERCUTANEOUS CORONARY STENT INTERVENTION (PCI-S);  Surgeon: Laverda Page, MD;  Location: [facility] CATH LAB;  Service: Cardiovascular;;  . PERCUTANEOUS CORONARY STENT INTERVENTION (PCI-S) N/A 12/28/2013   Procedure: PERCUTANEOUS CORONARY STENT INTERVENTION (PCI-S);  Surgeon: Laverda Page, MD;  Location: [facility] CATH LAB;  Service: Cardiovascular;  Laterality: N/A;  . PROSTATE SURGERY    . TONSILLECTOMY  1937  .  TRANSURETHRAL RESECTION OF PROSTATE  ~ 2007        Home Medications    Prior to Admission medications   Medication Sig Start Date End Date Taking? Authorizing Provider  albuterol-ipratropium (COMBIVENT) 18-103 MCG/ACT inhaler Inhale 2 puffs into the lungs every 4 (four) hours.     [provider]  aspirin EC 81 MG tablet Take 81 mg by mouth daily.    [provider]  clopidogrel (PLAVIX) 75 MG tablet Take 1 tablet (75 mg total) by mouth daily with breakfast. 11/26/13   Adrian Prows, MD  Clotrimazole 1 % OINT Apply 1 application topically 3 (three) times daily. 05/16/17   Tereasa Coop, PA-C  metoprolol tartrate (LOPRESSOR) 25 MG tablet Take 1 tablet (25 mg total) by mouth 2 (two) times daily. 12/29/13   Adrian Prows, MD  mupirocin ointment (BACTROBAN) 2 % Apply 1 application topically 2 (two) times daily. 05/12/17   Tereasa Coop, PA-C  OVER THE COUNTER MEDICATION Take 2 tablets by mouth every 4 (four) hours as needed (for shortness of breath, Lung Clear).    [provider]  OVER THE COUNTER MEDICATION     [provider]  spironolactone (ALDACTONE) 25 MG tablet Take 25 mg by mouth daily.    [provider]  tamsulosin (FLOMAX) 0.4 MG CAPS capsule Take 1 capsule (0.4 mg total) by mouth daily. 10/11/16   Wardell Honour, MD  valsartan-hydrochlorothiazide (DIOVAN-HCT) 160-25 MG per tablet Take 1 tablet by mouth daily.    [provider]  levalbuterol Penne Lash) 0.63 MG/3ML nebulizer solution Take 3 mLs (0.63 mg total) by nebulization 3 (three) times daily. 10/13/13 10/13/13  Sherian Maroon, MD    Family History Family History  Problem Relation Age of Onset  . Heart disease Mother   . Stroke Mother   . Heart disease Father   . Diabetes Brother     Social History Social History   Tobacco Use  . Smoking status: Former Research scientist (life sciences)  . Smokeless tobacco: Never Used  . Tobacco comment: "quit smoking in my 20's"  Substance Use Topics  . Alcohol  use: Yes    Comment: "stopped drinking in the 1970's"  . Drug use: No     Allergies   Patient has no known allergies.   Review of Systems Review of Systems  Constitutional: Negative for chills and fever.  HENT: Negative for congestion, rhinorrhea and sore throat.   Eyes: Negative for visual disturbance.  Respiratory: Negative for cough and shortness of breath.   Cardiovascular: Negative for chest pain.  Gastrointestinal: Positive for constipation, nausea and vomiting. Negative for abdominal distention, abdominal pain, blood in stool and rectal pain.  Genitourinary: Negative for dysuria and frequency.  Musculoskeletal: Negative for arthralgias and myalgias.  Skin: Negative for color change and rash.  Neurological: Negative for dizziness and light-headedness.     Physical Exam Updated Vital Signs BP (!) 152/82   Pulse (!) 57   Temp 97.8 F (36.6 C) (Oral)  Resp 15   Ht 5\' 9"  (1.753 m)   Wt 84.8 kg (187 lb)   SpO2 95%   BMI 27.62 kg/m   Physical Exam  Constitutional: He is oriented to person, place, and time. He appears well-developed and well-nourished. No distress.  HENT:  Head: Normocephalic and atraumatic.  Oropharynx clear, mucous membranes slightly dry  Eyes: Right eye exhibits no discharge. Left eye exhibits no discharge.  Neck: Neck supple.  Cardiovascular: Normal rate, regular rhythm, normal heart sounds and intact distal pulses.  Pulmonary/Chest: Effort normal. No respiratory distress.  Respirations equal and unlabored, patient able to speak in full sentences, lungs clear to auscultation bilaterally  Abdominal: Soft. Bowel sounds are normal. He exhibits no distension and no mass. There is tenderness. There is no rebound and no guarding. A hernia is present.  Abdomen soft and nondistended, when patient is sitting up there is a large ventral hernia present, but when the patient lays down this reduces spontaneously.  Abdomen with mild periumbilical tenderness to  palpation without guarding or rebound tenderness all other quadrants nontender to palpation, no CVA tenderness  Genitourinary:  Genitourinary Comments: Chaperone present during rectal exam.  Normal sphincter tone, rectal exam reveals several small soft pebbles shaped balls of stool that are easily removable, no evidence of fecal impaction  Musculoskeletal: He exhibits no edema or deformity.  Neurological: He is alert and oriented to person, place, and time. Coordination normal.  Skin: Skin is warm and dry. Capillary refill takes less than 2 seconds. He is not diaphoretic.  Psychiatric: He has a normal mood and affect. His behavior is normal.  Nursing note and vitals reviewed.    ED Treatments / Results  Labs (all labs ordered are listed, but only abnormal results are displayed) Labs Reviewed  LIPASE, BLOOD - Abnormal; Notable for the following components:      Result Value   Lipase 52 (*)    All other components within normal limits  COMPREHENSIVE METABOLIC PANEL - Abnormal; Notable for the following components:   Glucose, Bld 104 (*)    Total Bilirubin 1.8 (*)    All other components within normal limits  URINALYSIS, ROUTINE W REFLEX MICROSCOPIC - Abnormal; Notable for the following components:   Specific Gravity, Urine >1.046 (*)    Hgb urine dipstick SMALL (*)    Ketones, ur 20 (*)    All other components within normal limits  CBC    EKG EKG Interpretation  Date/Time:  Friday August 01 2017 16:34:05 EDT Ventricular Rate:  113 PR Interval:    QRS Duration: 93 QT Interval:  317 QTC Calculation: 435 R Axis:   -38 Text Interpretation:  Sinus tachycardia Probable left atrial enlargement Left ventricular hypertrophy Anterior infarct, old Lateral leads are also involved Baseline wander in lead(s) III V6 No significant change since last tracing Confirmed by Deno Etienne [phone]) on 08/01/2017 4:36:54 PM   Radiology Ct Abdomen Pelvis W Contrast  Result Date: 08/01/2017 CLINICAL DATA:   Constipation for the past week. Vomiting for the past 3 days. EXAM: CT ABDOMEN AND PELVIS WITH CONTRAST TECHNIQUE: Multidetector CT imaging of the abdomen and pelvis was performed using the standard protocol following bolus administration of intravenous contrast. CONTRAST:  117mL ISOVUE-300 IOPAMIDOL (ISOVUE-300) INJECTION 61% COMPARISON:  CT abdomen pelvis dated March 19, 2007. FINDINGS: Lower chest: No acute abnormality.  Cardiomegaly. Hepatobiliary: No focal liver abnormality. The gallbladder is contracted. No biliary dilatation. Pancreas: Mild-to-moderate atrophy. No ductal dilatation or surrounding inflammatory changes. Spleen: Normal in  size without focal abnormality. Adrenals/Urinary Tract: The adrenal glands are unremarkable. Unchanged bilateral renal cysts. Other bilateral subcentimeter low-density lesions remain too small to characterize but are stable. Bilateral punctate nonobstructive calculi. There is an 9 mm calculus in the right proximal ureter. No hydronephrosis. The bladder is unremarkable. Stomach/Bowel: Stomach is within normal limits. Appendix is not definitively visualized, however there are no signs of inflammation around the cecum. No evidence of bowel wall thickening, distention, or inflammatory changes. Sigmoid diverticulosis. Mild colonic stool burden. Vascular/Lymphatic: Aortic atherosclerosis. No enlarged abdominal or pelvic lymph nodes. Reproductive: Mild prostatomegaly. Other: No abdominal wall hernia or abnormality. No abdominopelvic ascites. No pneumoperitoneum. Musculoskeletal: Lucency involving the L4 vertebral body. No acute fracture. IMPRESSION: 1. Mild colonic stool burden, consistent with history of constipation. No fecal impaction or bowel obstruction. 2. Nonobstructive 9 mm calculus in the right proximal ureter. No hydronephrosis. Additional bilateral punctate nonobstructive renal calculi. 3. Lucent area involving the L4 vertebral body is favored to represent a large  Schmorl's node, less likely a discrete mass lesion. Consider non-emergent MRI of the lumbar spine for further evaluation. 4.  Aortic atherosclerosis (ICD10-I70.0). Electronically Signed   By: Titus Dubin M.D.   On: 08/01/2017 19:11    Procedures Procedures (including critical care time)  Medications Ordered in ED Medications  iopamidol (ISOVUE-300) 61 % injection 100 mL (100 mLs Intravenous Contrast Given 08/01/17 1842)  sodium chloride 0.9 % bolus 1,000 mL (0 mLs Intravenous Stopped 08/01/17 2200)  ondansetron (ZOFRAN) injection 4 mg (4 mg Intravenous Given 08/01/17 2033)  sodium chloride 0.9 % bolus 500 mL (0 mLs Intravenous Stopped 08/01/17 2307)     Initial Impression / Assessment and Plan / ED Course  I have reviewed the triage vital signs and the nursing notes.  Pertinent labs & imaging results that were available during my care of the patient were reviewed by me and considered in my medical decision making (see chart for details).  Patient presents for evaluation of 1 week of constipation with associated nausea and vomiting over the past few days he denies abdominal pain, no fevers, urinary symptoms, chest pain or shortness of breath.  On my evaluation patient initially is tachycardic and mildly hypertensive but vitals otherwise normal, patient does appear somewhat dehydrated and has not been eating and drinking well over the past 3 to 4 days.  There is mild periumbilical tenderness on exam, no evidence of fecal impaction on rectal exam will get abdominal labs and CT abdomen pelvis.  Labs are overall reassuring no leukocytosis, normal hemoglobin, no acute electrolyte derangements requiring intervention,, normal lipase, normal renal liver function, no evidence of urinary tract infection.  CT shows mild to moderate colonic stool burden consistent with history of constipation.  There is no evidence of fecal impaction or bowel obstruction.  Incidental findings of a 9 mm nonobstructive renal  stone in the proximal right ureter with no evidence of hydronephrosis as well as a lesion over the L4 vertebrae most likely a Schmorl's lesion, recommend nonemergent outpatient MRI for further evaluation.  Patient's tachycardia improving after fluid bolus, he received Zofran and has tolerated multiple cups of water without difficulty and no further vomiting.  At this time I feel patient is stable for discharge home we will have him use 8 capfuls of MiraLAX in 32 ounces of liquid to help move stools and then continue with 1 capful of MiraLAX daily he is to follow-up with his primary care doctor, strict return precautions.  Patient expresses understanding and is  in agreement with plan  Vitals:   08/01/17 2200 08/01/17 2230  BP: 137/86 (!) 148/71  Pulse: (!) 101 91  Resp:  18  Temp:    SpO2: 95% 96%   Patient discussed with Dr. Tyrone Nine, who saw patient as well and agrees with plan.   Final Clinical Impressions(s) / ED Diagnoses   Final diagnoses:  Constipation, unspecified constipation type  Non-intractable vomiting with nausea, unspecified vomiting type    ED Discharge Orders        Ordered    ondansetron (ZOFRAN ODT) 4 MG disintegrating tablet     08/01/17 2253       Jacqlyn Larsen, PA-C 08/02/17 [place], [place], DO 08/04/17 1954

## 2017-08-01 NOTE — ED Triage Notes (Signed)
PT C/O CONSTIPATION X1 WEEK, VOMITING X3 DAYS, AND FREQUENT URINATION. PT WAS SEEN AT [facility] TODAY FOR COMPLAINTS, AND SENT HERE FOR FURTHER EVALUATION FOR A FECAL IMPACTION. A UA WAS PERFORMED PTA. PT DENIES ABDOMINAL PAIN OR NAUSEA AT PRESENT.

## 2017-08-18 DIAGNOSIS — F320 Major depressive disorder, single episode, mild: Secondary | ICD-10-CM | POA: Diagnosis not present

## 2017-08-18 DIAGNOSIS — I10 Essential (primary) hypertension: Secondary | ICD-10-CM | POA: Diagnosis not present

## 2017-08-18 DIAGNOSIS — I700 Atherosclerosis of aorta: Secondary | ICD-10-CM | POA: Diagnosis not present

## 2017-08-18 DIAGNOSIS — I509 Heart failure, unspecified: Secondary | ICD-10-CM | POA: Diagnosis not present

## 2017-08-18 DIAGNOSIS — N400 Enlarged prostate without lower urinary tract symptoms: Secondary | ICD-10-CM | POA: Diagnosis not present

## 2017-08-18 DIAGNOSIS — J45909 Unspecified asthma, uncomplicated: Secondary | ICD-10-CM | POA: Diagnosis not present

## 2017-08-18 DIAGNOSIS — E785 Hyperlipidemia, unspecified: Secondary | ICD-10-CM | POA: Diagnosis not present

## 2017-08-18 DIAGNOSIS — E441 Mild protein-calorie malnutrition: Secondary | ICD-10-CM | POA: Diagnosis not present

## 2017-08-18 DIAGNOSIS — I255 Ischemic cardiomyopathy: Secondary | ICD-10-CM | POA: Diagnosis not present

## 2017-08-18 DIAGNOSIS — E1169 Type 2 diabetes mellitus with other specified complication: Secondary | ICD-10-CM | POA: Diagnosis not present

## 2017-08-18 DIAGNOSIS — I2510 Atherosclerotic heart disease of native coronary artery without angina pectoris: Secondary | ICD-10-CM | POA: Diagnosis not present

## 2017-08-25 ENCOUNTER — Encounter (HOSPITAL_COMMUNITY): Payer: Self-pay | Admitting: Emergency Medicine

## 2017-08-25 ENCOUNTER — Emergency Department (HOSPITAL_COMMUNITY): Payer: Medicare Other

## 2017-08-25 ENCOUNTER — Other Ambulatory Visit: Payer: Self-pay

## 2017-08-25 ENCOUNTER — Emergency Department (HOSPITAL_COMMUNITY)
Admission: EM | Admit: 2017-08-25 | Discharge: 2017-08-27 | Disposition: A | Discharge status: Home / Self Care | Payer: Medicare Other | Attending: Emergency Medicine | Admitting: Emergency Medicine

## 2017-08-25 DIAGNOSIS — I2510 Atherosclerotic heart disease of native coronary artery without angina pectoris: Secondary | ICD-10-CM | POA: Diagnosis not present

## 2017-08-25 DIAGNOSIS — E119 Type 2 diabetes mellitus without complications: Secondary | ICD-10-CM | POA: Insufficient documentation

## 2017-08-25 DIAGNOSIS — I517 Cardiomegaly: Secondary | ICD-10-CM | POA: Diagnosis not present

## 2017-08-25 DIAGNOSIS — Z87891 Personal history of nicotine dependence: Secondary | ICD-10-CM | POA: Diagnosis not present

## 2017-08-25 DIAGNOSIS — I509 Heart failure, unspecified: Secondary | ICD-10-CM | POA: Insufficient documentation

## 2017-08-25 DIAGNOSIS — F329 Major depressive disorder, single episode, unspecified: Secondary | ICD-10-CM | POA: Diagnosis not present

## 2017-08-25 DIAGNOSIS — I110 Hypertensive heart disease with heart failure: Secondary | ICD-10-CM | POA: Insufficient documentation

## 2017-08-25 DIAGNOSIS — Z7902 Long term (current) use of antithrombotics/antiplatelets: Secondary | ICD-10-CM | POA: Insufficient documentation

## 2017-08-25 DIAGNOSIS — J45909 Unspecified asthma, uncomplicated: Secondary | ICD-10-CM | POA: Insufficient documentation

## 2017-08-25 DIAGNOSIS — R4182 Altered mental status, unspecified: Secondary | ICD-10-CM | POA: Diagnosis not present

## 2017-08-25 DIAGNOSIS — R45851 Suicidal ideations: Secondary | ICD-10-CM | POA: Diagnosis not present

## 2017-08-25 DIAGNOSIS — F32A Depression, unspecified: Secondary | ICD-10-CM

## 2017-08-25 LAB — COMPREHENSIVE METABOLIC PANEL
ALBUMIN: 3.7 g/dL (ref 3.5–5.0)
ALT: 116 U/L — AB (ref 0–44)
AST: 56 U/L — AB (ref 15–41)
Alkaline Phosphatase: 62 U/L (ref 38–126)
Anion gap: 10 (ref 5–15)
BILIRUBIN TOTAL: 1.8 mg/dL — AB (ref 0.3–1.2)
BUN: 15 mg/dL (ref 8–23)
CHLORIDE: 106 mmol/L (ref 98–111)
CO2: 26 mmol/L (ref 22–32)
Calcium: 8.8 mg/dL — ABNORMAL LOW (ref 8.9–10.3)
Creatinine, Ser: 1 mg/dL (ref 0.61–1.24)
GFR calc Af Amer: >60 mL/min (ref >60)
GFR calc non Af Amer: >60 mL/min (ref >60)
Glucose, Bld: 99 mg/dL (ref 70–99)
POTASSIUM: 3.5 mmol/L (ref 3.5–5.1)
Sodium: 142 mmol/L (ref 135–145)
Total Protein: 6.2 g/dL — ABNORMAL LOW (ref 6.5–8.1)

## 2017-08-25 LAB — RAPID URINE DRUG SCREEN, HOSP PERFORMED
AMPHETAMINES: NOT DETECTED
BENZODIAZEPINES: NOT DETECTED
Cocaine: NOT DETECTED
OPIATES: NOT DETECTED
Tetrahydrocannabinol: NOT DETECTED

## 2017-08-25 LAB — URINALYSIS, ROUTINE W REFLEX MICROSCOPIC
BACTERIA UA: NONE SEEN
BILIRUBIN URINE: NEGATIVE
Glucose, UA: NEGATIVE mg/dL
KETONES UR: 20 mg/dL — AB
Leukocytes, UA: NEGATIVE
Nitrite: NEGATIVE
PROTEIN: NEGATIVE mg/dL
Specific Gravity, Urine: 1.025 (ref 1.005–1.030)
pH: 5 (ref 5.0–8.0)

## 2017-08-25 LAB — CBC
HCT: 42.2 % (ref 39.0–52.0)
Hemoglobin: 14.2 g/dL (ref 13.0–17.0)
MCH: 31.1 pg (ref 26.0–34.0)
MCHC: 33.6 g/dL (ref 30.0–36.0)
MCV: 92.3 fL (ref 78.0–100.0)
PLATELETS: 182 10*3/uL (ref 150–400)
RBC: 4.57 MIL/uL (ref 4.22–5.81)
RDW: 14.9 % (ref 11.5–15.5)
WBC: 4 10*3/uL (ref 4.0–10.5)

## 2017-08-25 LAB — SALICYLATE LEVEL

## 2017-08-25 LAB — ACETAMINOPHEN LEVEL

## 2017-08-25 LAB — ETHANOL

## 2017-08-25 MED ORDER — METOPROLOL TARTRATE 25 MG PO TABS
25.0000 mg | ORAL_TABLET | Freq: Two times a day (BID) | ORAL | Status: DC
  Administered 2017-08-25 – 2017-08-27 (×3): 25 mg via ORAL
  Filled 2017-08-25 (×2): qty 1

## 2017-08-25 MED ORDER — METOPROLOL TARTRATE 25 MG PO TABS
ORAL_TABLET | ORAL | Status: AC
  Filled 2017-08-25: qty 1

## 2017-08-25 MED ORDER — CLOPIDOGREL BISULFATE 75 MG PO TABS
75.0000 mg | ORAL_TABLET | Freq: Every day | ORAL | Status: DC
  Administered 2017-08-26: 75 mg via ORAL
  Filled 2017-08-25 (×3): qty 1

## 2017-08-25 MED ORDER — HYDROXYZINE HCL 25 MG PO TABS
ORAL_TABLET | ORAL | Status: AC
  Filled 2017-08-25: qty 1

## 2017-08-25 MED ORDER — HYDROXYZINE HCL 25 MG PO TABS
25.0000 mg | ORAL_TABLET | Freq: Once | ORAL | Status: AC
  Administered 2017-08-25: 25 mg via ORAL

## 2017-08-25 NOTE — ED Provider Notes (Signed)
[facility] DEPT Provider Note   CSN: [id number] Arrival date & time: 08/25/17  1627     History   Chief Complaint No chief complaint on file.   HPI [name] [name] is a 84 y.o. male.  HPI Patient brought in under involuntary commitment.  Reportedly has history of depression.  Reportedly has been calling family members and asking him to kill him.  Reportedly offered one of them $5000 to do it and has called repeatedly.  Patient denies that he made repeated requests and said he choked once about someone killing him.  States he really does not want to be alive however.  States he does not want to be alive but would not kill himself.  Denies substance abuse.  History of depression.  Had been on medications previously but states he is not on them because they made him feel worse. Past Medical History:  Diagnosis Date  . Anxiety   . Asthma    "all my life"  . BPH (benign prostatic hyperplasia)    "even now; after my OR" (12/28/2013)  . CHF (congestive heart failure) ([place])   . Chronic bronchitis ([place])    "often"  . Coronary artery disease   . Depression   . Diabetes mellitus without complication ([place])    Type II  . Diverticulosis of colon (without mention of hemorrhage)   . GERD (gastroesophageal reflux disease)   . Hiatal hernia   . High cholesterol   . Hypertension   . Kidney stone    "I've had 7 over the years" (12/28/2013)  . Pneumonia 1930's  . Rhinitis     Patient Active Problem List   Diagnosis Date Noted  . S/P PTCA (percutaneous transluminal coronary angioplasty) 12/28/2013  . CAD (coronary artery disease), native coronary artery 12/26/2013  . Postsurgical percutaneous transluminal coronary angioplasty (PTCA) status 11/25/2013  . ACUTE ATOPIC CONJUNCTIVITIS 01/08/2007  . ACUTE BRONCHITIS 01/08/2007  . INSOMNIA-SLEEP DISORDER-UNSPEC 12/19/2006  . ANXIETY 12/10/2006  . DEPRESSION 12/10/2006  . TINNITUS, CHRONIC 12/10/2006  . ALLERGIC  RHINITIS 12/10/2006  . BENIGN PROSTATIC HYPERTROPHY 12/10/2006  . WEIGHT LOSS 12/10/2006  . GLUCOSE INTOLERANCE, MINIMAL, HX OF 12/10/2006  . NEPHROLITHIASIS, HX OF 12/10/2006  . RHINITIS, ALLERGIC NEC 12/09/2006  . Unspecified asthma(493.90) 12/09/2006  . COLONIC POLYPS 11/24/2006  . DIABETES MELLITUS, TYPE II 11/24/2006  . HYPERLIPIDEMIA 11/24/2006  . HYPERTENSION 11/24/2006  . GERD 11/24/2006  . Diverticulosis of colon (without mention of hemorrhage) 11/24/2006  . CALCULUS, KIDNEY 11/24/2006  . BENIGN PROSTATIC HYPERTROPHY, HX OF 11/24/2006    Past Surgical History:  Procedure Laterality Date  . COLONOSCOPY W/ BIOPSIES AND POLYPECTOMY    . CORONARY ANGIOPLASTY WITH STENT PLACEMENT  11/25/2013; 12/28/2013   "1; ?2"  . CYSTOSCOPY W/ LITHOLAPAXY / EHL  X 2  . INGUINAL HERNIA REPAIR Left 1962  . INGUINAL HERNIA REPAIR Right 1980  . LEFT HEART CATHETERIZATION WITH CORONARY ANGIOGRAM N/A 11/25/2013   Procedure: LEFT HEART CATHETERIZATION WITH CORONARY ANGIOGRAM;  Surgeon: Laverda Page, MD;  Location[facility] CATH LAB;  Service: Cardiovascular;  Laterality: N/A;  . PERCUTANEOUS CORONARY STENT INTERVENTION (PCI-S)  11/25/2013   Procedure: PERCUTANEOUS CORONARY STENT INTERVENTION (PCI-S);  Surgeon: Laverda Page, MD;  Location: [facility] CATH LAB;  Service: Cardiovascular;;  . PERCUTANEOUS CORONARY STENT INTERVENTION (PCI-S) N/A 12/28/2013   Procedure: PERCUTANEOUS CORONARY STENT INTERVENTION (PCI-S);  Surgeon: Laverda Page, MD;  Location: [facility] CATH LAB;  Service: Cardiovascular;  Laterality: N/A;  . PROSTATE SURGERY    .  TONSILLECTOMY  1937  . TRANSURETHRAL RESECTION OF PROSTATE  ~ 2007        Home Medications    Prior to Admission medications   Medication Sig Start Date End Date Taking? Authorizing Provider  clopidogrel (PLAVIX) 75 MG tablet Take 1 tablet (75 mg total) by mouth daily with breakfast. 11/26/13  Yes Adrian Prows, MD  metoprolol tartrate (LOPRESSOR) 25 MG tablet Take 1  tablet (25 mg total) by mouth 2 (two) times daily. 12/29/13  Yes Adrian Prows, MD  OVER THE COUNTER MEDICATION Take 1 tablet by mouth daily. Glucose Support   Yes [provider]  Clotrimazole 1 % OINT Apply 1 application topically 3 (three) times daily. Patient not taking: Reported on 08/25/2017 05/16/17   Tereasa Coop, PA-C  mupirocin ointment (BACTROBAN) 2 % Apply 1 application topically 2 (two) times daily. Patient not taking: Reported on 08/01/2017 05/12/17   Tereasa Coop, PA-C  ondansetron (ZOFRAN ODT) 4 MG disintegrating tablet 4mg  ODT q4 hours prn nausea/vomit Patient not taking: Reported on 08/25/2017 08/01/17   Jacqlyn Larsen, PA-C  tamsulosin (FLOMAX) 0.4 MG CAPS capsule Take 1 capsule (0.4 mg total) by mouth daily. Patient not taking: Reported on 08/01/2017 10/11/16   Wardell Honour, MD    Family History Family History  Problem Relation Age of Onset  . Heart disease Mother   . Stroke Mother   . Heart disease Father   . Diabetes Brother     Social History Social History   Tobacco Use  . Smoking status: Former Research scientist (life sciences)  . Smokeless tobacco: Never Used  . Tobacco comment: "quit smoking in my 20's"  Substance Use Topics  . Alcohol use: Yes    Comment: "stopped drinking in the 1970's"  . Drug use: No     Allergies   Lipitor [atorvastatin calcium]   Review of Systems Review of Systems  Constitutional: Positive for appetite change. Negative for chills and fever.  Respiratory: Negative for shortness of breath.   Cardiovascular: Negative for chest pain.  Gastrointestinal: Negative for abdominal pain.  Genitourinary: Negative for hematuria.  Musculoskeletal: Negative for gait problem.  Neurological: Negative for weakness.  Psychiatric/Behavioral: Positive for dysphoric mood and suicidal ideas.     Physical Exam Updated Vital Signs BP 136/84 (BP Location: Right Arm)   Pulse (!) 108   Temp 97.6 F (36.4 C) (Oral)   Resp 20   SpO2 96%   Physical Exam    Constitutional: He appears well-developed.  Eyes: Pupils are equal, round, and reactive to light.  Cardiovascular: Normal rate.  Pulmonary/Chest: Effort normal.  Abdominal: Soft.  Musculoskeletal: He exhibits no tenderness.  Neurological: He is alert.  Skin: Skin is warm.  Psychiatric:  Somewhat depressed mood.     ED Treatments / Results  Labs (all labs ordered are listed, but only abnormal results are displayed) Labs Reviewed  COMPREHENSIVE METABOLIC PANEL - Abnormal; Notable for the following components:      Result Value   Calcium 8.8 (*)    Total Protein 6.2 (*)    AST 56 (*)    ALT 116 (*)    Total Bilirubin 1.8 (*)    All other components within normal limits  ACETAMINOPHEN LEVEL - Abnormal; Notable for the following components:   Acetaminophen (Tylenol), Serum <10 (*)    All other components within normal limits  RAPID URINE DRUG SCREEN, HOSP PERFORMED - Abnormal; Notable for the following components:   Barbiturates   (*)  Value: Result not available. Reagent lot number recalled by manufacturer.   All other components within normal limits  URINALYSIS, ROUTINE W REFLEX MICROSCOPIC - Abnormal; Notable for the following components:   Color, Urine AMBER (*)    Hgb urine dipstick SMALL (*)    Ketones, ur 20 (*)    All other components within normal limits  ETHANOL  SALICYLATE LEVEL  CBC    EKG None  Radiology Dg Chest Portable 1 View  Result Date: 08/25/2017 CLINICAL DATA:  Altered mental status EXAM: PORTABLE CHEST 1 VIEW COMPARISON:  10/13/2013 FINDINGS: Mildly low volume study. Cardiomegaly noted. There is no evidence of focal airspace disease, pulmonary edema, suspicious pulmonary nodule/mass, pleural effusion, or pneumothorax. No acute bony abnormalities are identified. IMPRESSION: Cardiomegaly without evidence of acute cardiopulmonary disease. Electronically Signed   By: Margarette Canada M.D.   On: 08/25/2017 18:10    Procedures Procedures (including  critical care time)  Medications Ordered in ED Medications - No data to display   Initial Impression / Assessment and Plan / ED Course  I have reviewed the triage vital signs and the nursing notes.  Pertinent labs & imaging results that were available during my care of the patient were reviewed by me and considered in my medical decision making (see chart for details).     Patient with depression.  Questionable suicidal statements.  Reportedly had made them repeatedly.  Patient does sound as if he is depressed, but he denies making repeated suicidal statements.  Lab work is returned and patient is medically cleared.  To be seen by TTS.  Final Clinical Impressions(s) / ED Diagnoses   Final diagnoses:  Depression, unspecified depression type    ED Discharge Orders    None       Davonna Belling, MD 08/25/17 2031

## 2017-08-25 NOTE — ED Triage Notes (Signed)
Patient diagnosed with depression but according to family has not been following through with his treatment.  Recently he has been obsessed with suicide.  The patient recently called the petitioner and specifically asked him to shoot him through the heart offering him $5,000. to do this.  He has also requested that his nephew kill him.  Patient arrived with [employer] and is IVC'd.

## 2017-08-25 NOTE — ED Notes (Signed)
TTS assessment in progress. 

## 2017-08-25 NOTE — ED Notes (Signed)
Writer asked pt if he takes medication at home, pt reports he "stopped taking all those pills"

## 2017-08-26 ENCOUNTER — Ambulatory Visit: Payer: Federal, State, Local not specified - PPO | Admitting: Sports Medicine

## 2017-08-26 DIAGNOSIS — F329 Major depressive disorder, single episode, unspecified: Secondary | ICD-10-CM | POA: Diagnosis not present

## 2017-08-26 LAB — CBG MONITORING, ED: Glucose-Capillary: 133 mg/dL — ABNORMAL HIGH (ref 70–99)

## 2017-08-26 MED ORDER — CITALOPRAM HYDROBROMIDE 10 MG PO TABS
10.0000 mg | ORAL_TABLET | Freq: Every day | ORAL | Status: DC
  Administered 2017-08-26 – 2017-08-27 (×2): 10 mg via ORAL
  Filled 2017-08-26 (×2): qty 1

## 2017-08-26 NOTE — ED Notes (Addendum)
Writer assisted pt. With urnial. Pt output 225ml.

## 2017-08-26 NOTE — Progress Notes (Signed)
CSW received a call from [facility] stating they could not accept pt.  Disposition is aware.'  Please reconsult if future social work needs arise.  CSW signing off, as social work intervention is no longer needed.  Alphonse Guild. Aleaha Fickling, LCSW, LCAS, CSI Clinical Social Worker Ph: [phone]     '
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
## 2017-08-26 NOTE — BH Assessment (Signed)
[facility] Assessment Progress Note  Per Buford Dresser, DO, this pt requires psychiatric hospitalization at this time.  Pt presents under IVC initiated by pt's great-nephew, which Dr Mariea Clonts has rescinded.  The following facilities have been contacted to seek placement for this pt, with results as noted:  Beds available, information sent, decision pending:  [place]:  [facility] [place], [place] Coordinator [phone]

## 2017-08-26 NOTE — ED Notes (Signed)
Writer assisted patient with urnial. Pt. Output 264ml.

## 2017-08-26 NOTE — Progress Notes (Addendum)
CSW received a call from [facility] has been accepted by: [facility] Number for report is: [phone] Pt's unit/room/bed number will be: [facility] via the Emergency DEpartment Accepting physician: Dr. Franchot Mimes  Pt can arrive ASAP on 08/26/17 or on 08/27/17.  RN: Please call for report when transport arrives to p/u the pt and please call tonight to let [facility] know, if pt is able to be transported tonight.   OF NOTE: PT IS IVC'D.  CSW will update RN.  6:20 PM' RN updated.  Alphonse Guild. Imre Vecchione, LCSW, LCAS, CSI Clinical Social Worker Ph: [phone]   '

## 2017-08-27 DIAGNOSIS — R45851 Suicidal ideations: Secondary | ICD-10-CM | POA: Diagnosis not present

## 2017-08-27 DIAGNOSIS — E785 Hyperlipidemia, unspecified: Secondary | ICD-10-CM | POA: Diagnosis present

## 2017-08-27 DIAGNOSIS — I110 Hypertensive heart disease with heart failure: Secondary | ICD-10-CM | POA: Diagnosis not present

## 2017-08-27 DIAGNOSIS — G301 Alzheimer's disease with late onset: Secondary | ICD-10-CM | POA: Diagnosis not present

## 2017-08-27 DIAGNOSIS — E119 Type 2 diabetes mellitus without complications: Secondary | ICD-10-CM | POA: Diagnosis not present

## 2017-08-27 DIAGNOSIS — I10 Essential (primary) hypertension: Secondary | ICD-10-CM | POA: Diagnosis present

## 2017-08-27 DIAGNOSIS — F322 Major depressive disorder, single episode, severe without psychotic features: Secondary | ICD-10-CM | POA: Diagnosis not present

## 2017-08-27 DIAGNOSIS — J45909 Unspecified asthma, uncomplicated: Secondary | ICD-10-CM | POA: Diagnosis not present

## 2017-08-27 DIAGNOSIS — F329 Major depressive disorder, single episode, unspecified: Secondary | ICD-10-CM | POA: Diagnosis not present

## 2017-08-27 DIAGNOSIS — I509 Heart failure, unspecified: Secondary | ICD-10-CM | POA: Diagnosis not present

## 2017-08-27 DIAGNOSIS — J449 Chronic obstructive pulmonary disease, unspecified: Secondary | ICD-10-CM | POA: Diagnosis not present

## 2017-08-27 DIAGNOSIS — K219 Gastro-esophageal reflux disease without esophagitis: Secondary | ICD-10-CM | POA: Diagnosis present

## 2017-08-27 DIAGNOSIS — F333 Major depressive disorder, recurrent, severe with psychotic symptoms: Secondary | ICD-10-CM | POA: Diagnosis not present

## 2017-08-27 NOTE — ED Notes (Signed)
Sheriff here to transport patient.  Report called to [facility]

## 2017-08-27 NOTE — ED Notes (Signed)
Sheriff called, message left for patient transfer

## 2017-08-27 NOTE — ED Notes (Signed)
Two pt belongings  bags were sent with pt upon discharge.

## 2017-09-23 ENCOUNTER — Emergency Department (HOSPITAL_COMMUNITY)
Admission: EM | Admit: 2017-09-23 | Discharge: 2017-09-24 | Disposition: A | Discharge status: Skilled Nursing Facility | Payer: Medicare Other | Attending: Emergency Medicine | Admitting: Emergency Medicine

## 2017-09-23 ENCOUNTER — Encounter (HOSPITAL_COMMUNITY): Payer: Self-pay

## 2017-09-23 DIAGNOSIS — Z79899 Other long term (current) drug therapy: Secondary | ICD-10-CM | POA: Insufficient documentation

## 2017-09-23 DIAGNOSIS — Z7902 Long term (current) use of antithrombotics/antiplatelets: Secondary | ICD-10-CM | POA: Diagnosis not present

## 2017-09-23 DIAGNOSIS — N132 Hydronephrosis with renal and ureteral calculous obstruction: Secondary | ICD-10-CM | POA: Diagnosis not present

## 2017-09-23 DIAGNOSIS — Z87891 Personal history of nicotine dependence: Secondary | ICD-10-CM | POA: Insufficient documentation

## 2017-09-23 DIAGNOSIS — Z955 Presence of coronary angioplasty implant and graft: Secondary | ICD-10-CM | POA: Insufficient documentation

## 2017-09-23 DIAGNOSIS — I110 Hypertensive heart disease with heart failure: Secondary | ICD-10-CM | POA: Insufficient documentation

## 2017-09-23 DIAGNOSIS — N201 Calculus of ureter: Secondary | ICD-10-CM | POA: Diagnosis not present

## 2017-09-23 DIAGNOSIS — I2510 Atherosclerotic heart disease of native coronary artery without angina pectoris: Secondary | ICD-10-CM | POA: Diagnosis not present

## 2017-09-23 DIAGNOSIS — J45909 Unspecified asthma, uncomplicated: Secondary | ICD-10-CM | POA: Diagnosis not present

## 2017-09-23 DIAGNOSIS — I509 Heart failure, unspecified: Secondary | ICD-10-CM | POA: Diagnosis not present

## 2017-09-23 DIAGNOSIS — K5900 Constipation, unspecified: Secondary | ICD-10-CM | POA: Diagnosis not present

## 2017-09-23 DIAGNOSIS — E119 Type 2 diabetes mellitus without complications: Secondary | ICD-10-CM | POA: Insufficient documentation

## 2017-09-23 DIAGNOSIS — F29 Unspecified psychosis not due to a substance or known physiological condition: Secondary | ICD-10-CM | POA: Diagnosis not present

## 2017-09-23 NOTE — ED Notes (Signed)
Bed: WTR6 Expected date:  Expected time:  Means of arrival:  Comments: 

## 2017-09-23 NOTE — ED Triage Notes (Addendum)
Pt presents with c/o constipation per patient. Pt is coming from [facility]. Per staff, pt is having normal bowel movements but pt believes himself to be constipated. When speaking with pt, he reports that he has not been able to completely empty his bowels and he reports that he has to put his finger up his rectum to clear out all of the poop. Pt does have some psychiatric hx.

## 2017-09-23 NOTE — ED Notes (Signed)
Pt offered food and drink but continues to refuse stating that he's "too constipated." Offered pt a pillow and blanket to help make him more comfortable due to his extended wait period, but pt continues to refuse.

## 2017-09-24 ENCOUNTER — Other Ambulatory Visit: Payer: Self-pay

## 2017-09-24 ENCOUNTER — Emergency Department (HOSPITAL_COMMUNITY): Payer: Medicare Other

## 2017-09-24 ENCOUNTER — Encounter (HOSPITAL_COMMUNITY): Payer: Self-pay

## 2017-09-24 DIAGNOSIS — Z743 Need for continuous supervision: Secondary | ICD-10-CM | POA: Diagnosis not present

## 2017-09-24 DIAGNOSIS — K5900 Constipation, unspecified: Secondary | ICD-10-CM | POA: Diagnosis not present

## 2017-09-24 DIAGNOSIS — N132 Hydronephrosis with renal and ureteral calculous obstruction: Secondary | ICD-10-CM | POA: Diagnosis not present

## 2017-09-24 DIAGNOSIS — R279 Unspecified lack of coordination: Secondary | ICD-10-CM | POA: Diagnosis not present

## 2017-09-24 LAB — URINALYSIS, ROUTINE W REFLEX MICROSCOPIC
Bilirubin Urine: NEGATIVE
GLUCOSE, UA: NEGATIVE mg/dL
KETONES UR: 20 mg/dL — AB
Leukocytes, UA: NEGATIVE
NITRITE: NEGATIVE
PROTEIN: NEGATIVE mg/dL
Specific Gravity, Urine: >1.046 — ABNORMAL HIGH (ref 1.005–1.030)
pH: 5 (ref 5.0–8.0)

## 2017-09-24 LAB — CBC WITH DIFFERENTIAL/PLATELET
BASOS ABS: 0 10*3/uL (ref 0.0–0.1)
BASOS PCT: 1 %
EOS ABS: 0.1 10*3/uL (ref 0.0–0.7)
Eosinophils Relative: 3 %
HCT: 38.6 % — ABNORMAL LOW (ref 39.0–52.0)
HEMOGLOBIN: 13.2 g/dL (ref 13.0–17.0)
Lymphocytes Relative: 21 %
Lymphs Abs: 0.9 10*3/uL (ref 0.7–4.0)
MCH: 31.6 pg (ref 26.0–34.0)
MCHC: 34.2 g/dL (ref 30.0–36.0)
MCV: 92.3 fL (ref 78.0–100.0)
Monocytes Absolute: 0.5 10*3/uL (ref 0.1–1.0)
Monocytes Relative: 12 %
NEUTROS PCT: 63 %
Neutro Abs: 2.6 10*3/uL (ref 1.7–7.7)
PLATELETS: 207 10*3/uL (ref 150–400)
RBC: 4.18 MIL/uL — AB (ref 4.22–5.81)
RDW: 14.9 % (ref 11.5–15.5)
WBC: 4.1 10*3/uL (ref 4.0–10.5)

## 2017-09-24 LAB — COMPREHENSIVE METABOLIC PANEL
ALBUMIN: 3.9 g/dL (ref 3.5–5.0)
ALK PHOS: 58 U/L (ref 38–126)
ALT: 30 U/L (ref 0–44)
AST: 24 U/L (ref 15–41)
Anion gap: 10 (ref 5–15)
BUN: 14 mg/dL (ref 8–23)
CALCIUM: 9.1 mg/dL (ref 8.9–10.3)
CHLORIDE: 107 mmol/L (ref 98–111)
CO2: 25 mmol/L (ref 22–32)
CREATININE: 0.92 mg/dL (ref 0.61–1.24)
GFR calc Af Amer: >60 mL/min (ref >60)
GFR calc non Af Amer: >60 mL/min (ref >60)
GLUCOSE: 106 mg/dL — AB (ref 70–99)
Potassium: 3.7 mmol/L (ref 3.5–5.1)
SODIUM: 142 mmol/L (ref 135–145)
Total Bilirubin: 1.6 mg/dL — ABNORMAL HIGH (ref 0.3–1.2)
Total Protein: 6.5 g/dL (ref 6.5–8.1)

## 2017-09-24 LAB — LIPASE, BLOOD: Lipase: 51 U/L (ref 11–51)

## 2017-09-24 MED ORDER — TAMSULOSIN HCL 0.4 MG PO CAPS: 0.4000 mg | ORAL_CAPSULE | Freq: Every day | ORAL | 0 refills | Status: DC

## 2017-09-24 MED ORDER — GLYCERIN (LAXATIVE) 2.1 G RE SUPP
1.0000 | Freq: Once | RECTAL | Status: AC
  Administered 2017-09-24: 1 via RECTAL
  Filled 2017-09-24: qty 1

## 2017-09-24 MED ORDER — GLYCERIN (ADULT) 2 G RE SUPP: 1.0000 | RECTAL | 0 refills | Status: DC | PRN

## 2017-09-24 MED ORDER — IOPAMIDOL (ISOVUE-300) INJECTION 61%
INTRAVENOUS | Status: AC
  Filled 2017-09-24: qty 100

## 2017-09-24 MED ORDER — IOPAMIDOL (ISOVUE-300) INJECTION 61%
100.0000 mL | Freq: Once | INTRAVENOUS | Status: AC | PRN
  Administered 2017-09-24: 100 mL via INTRAVENOUS

## 2017-09-24 NOTE — ED Notes (Signed)
Darell-the assisted living coordinator-called for report regarding the pt. Advised her that it will be a few more hours before he arrives at the facility.

## 2017-09-24 NOTE — ED Notes (Addendum)
Pt has been calling out for an enema. EDP has been notified that pt wants relief.

## 2017-09-24 NOTE — Discharge Instructions (Addendum)
Work-up has been reassuring in the emergency department today.  I suspect that patient's pain is likely due to constipation.  Continue the MiraLAX with associated stool softener.  Also given glycerin suppositories to use as needed.  Patient will need to follow-up with a GI doctor if symptoms persist have given her referral.  Patient also has a large kidney stone that will need to be followed up with urology.  Patient needs to take the Flomax daily along with Tylenol and Motrin for pain as needed.  If patient develops any signs of infection return to the ED immediately.  Have given referral for urology.  Patient is to follow with his primary care doctor concerning possible further imaging for a node on patient's lumbar vertebrae.  Return to ED with any worsening pain, bloody stools, fevers, vomiting or for any other reason.  IMPRESSION:  1. Previous 9 stone in the right proximal ureter has progressed,  currently in the mid ureter with mild proximal  hydroureteronephrosis. Additional bilateral nonobstructing renal  calculi.  2. Colonic diverticulosis without diverticulitis. Moderate stool  burden throughout the colon. No colonic inflammation, fecal  impaction or obstruction.  3. Unchanged lucent area in L4 vertebral body over the past 6 weeks,  favored to represent a large Schmorl's node. As mentioned  previously, consider nonemergent MRI lumbar spine for further  evaluation.  4.  Aortic Atherosclerosis (ICD10-I70.0).

## 2017-09-24 NOTE — ED Notes (Signed)
Report called to assisted living facility. [facility] contacted for transport.

## 2017-09-24 NOTE — ED Provider Notes (Signed)
[facility] DEPT Provider Note   CSN: [id number] Arrival date & time: 09/23/17  1802     History   Chief Complaint Chief Complaint  Patient presents with  . Constipation    HPI [name] [name] is a 84 y.o. male.  HPI 84 year old male past medical history significant for CHF, depression, CAD, asthma, hypertension, diabetes presents to the emergency department today for evaluation of constipation.  Patient states that he has not been able to have a bowel movement over the past week.  Patient states that he does have a history of constipation.  Has denied any melena or hematochezia prior to that.  Patient denies any abdominal pain but states he just needs to have a bowel movement.  States that he has been using his finger for digital disimpaction.  Has been taking MiraLAX at home.  Patient denies any abdominal surgeries.  Denies any chest pain or shortness of breath.  He has not taken anything today for his symptoms.  Patient denies any urinary symptoms.  Pt is has frustrated about his weight time.  Pt denies any fever, chill, ha, vision changes, lightheadedness, dizziness, congestion, neck pain, cp, sob, cough, abd pain, n/v/d, urinary symptoms,  melena, hematochezia, lower extremity paresthesias. Past Medical History:  Diagnosis Date  . Anxiety   . Asthma    "all my life"  . BPH (benign prostatic hyperplasia)    "even now; after my OR" (12/28/2013)  . CHF (congestive heart failure) ([place])   . Chronic bronchitis ([place])    "often"  . Coronary artery disease   . Depression   . Diabetes mellitus without complication ([place])    Type II  . Diverticulosis of colon (without mention of hemorrhage)   . GERD (gastroesophageal reflux disease)   . Hiatal hernia   . High cholesterol   . Hypertension   . Kidney stone    "I've had 7 over the years" (12/28/2013)  . Pneumonia 1930's  . Rhinitis     Patient Active Problem List   Diagnosis Date Noted  . S/P PTCA  (percutaneous transluminal coronary angioplasty) 12/28/2013  . CAD (coronary artery disease), native coronary artery 12/26/2013  . Postsurgical percutaneous transluminal coronary angioplasty (PTCA) status 11/25/2013  . ACUTE ATOPIC CONJUNCTIVITIS 01/08/2007  . ACUTE BRONCHITIS 01/08/2007  . INSOMNIA-SLEEP DISORDER-UNSPEC 12/19/2006  . ANXIETY 12/10/2006  . DEPRESSION 12/10/2006  . TINNITUS, CHRONIC 12/10/2006  . ALLERGIC RHINITIS 12/10/2006  . BENIGN PROSTATIC HYPERTROPHY 12/10/2006  . WEIGHT LOSS 12/10/2006  . GLUCOSE INTOLERANCE, MINIMAL, HX OF 12/10/2006  . NEPHROLITHIASIS, HX OF 12/10/2006  . RHINITIS, ALLERGIC NEC 12/09/2006  . Unspecified asthma(493.90) 12/09/2006  . COLONIC POLYPS 11/24/2006  . DIABETES MELLITUS, TYPE II 11/24/2006  . HYPERLIPIDEMIA 11/24/2006  . HYPERTENSION 11/24/2006  . GERD 11/24/2006  . Diverticulosis of colon (without mention of hemorrhage) 11/24/2006  . CALCULUS, KIDNEY 11/24/2006  . BENIGN PROSTATIC HYPERTROPHY, HX OF 11/24/2006    Past Surgical History:  Procedure Laterality Date  . COLONOSCOPY W/ BIOPSIES AND POLYPECTOMY    . CORONARY ANGIOPLASTY WITH STENT PLACEMENT  11/25/2013; 12/28/2013   "1; ?2"  . CYSTOSCOPY W/ LITHOLAPAXY / [facility]  X 2  . INGUINAL HERNIA REPAIR Left 1962  . INGUINAL HERNIA REPAIR Right 1980  . LEFT HEART CATHETERIZATION WITH CORONARY ANGIOGRAM N/A 11/25/2013   Procedure: LEFT HEART CATHETERIZATION WITH CORONARY ANGIOGRAM;  Surgeon: Laverda Page, MD;  Location[facility] C[facility] LAB;  Service: Cardiovascular;  Laterality: N/A;  . PERCUTANEOUS CORONARY STENT INTERVENTION (PCI-S)  11/25/2013   Procedure: PERCUTANEOUS CORONARY STENT INTERVENTION (PCI-S);  Surgeon: Laverda Page, MD;  Location: [facility] CATH LAB;  Service: Cardiovascular;;  . PERCUTANEOUS CORONARY STENT INTERVENTION (PCI-S) N/A 12/28/2013   Procedure: PERCUTANEOUS CORONARY STENT INTERVENTION (PCI-S);  Surgeon: Laverda Page, MD;  Location: [facility] CATH LAB;  Service:  Cardiovascular;  Laterality: N/A;  . PROSTATE SURGERY    . TONSILLECTOMY  1937  . TRANSURETHRAL RESECTION OF PROSTATE  ~ 2007        Home Medications    Prior to Admission medications   Medication Sig Start Date End Date Taking? Authorizing Provider  clopidogrel (PLAVIX) 75 MG tablet Take 1 tablet (75 mg total) by mouth daily with breakfast. 11/26/13   Adrian Prows, MD  Clotrimazole 1 % OINT Apply 1 application topically 3 (three) times daily. Patient not taking: Reported on 08/25/2017 05/16/17   Tereasa Coop, PA-C  metoprolol tartrate (LOPRESSOR) 25 MG tablet Take 1 tablet (25 mg total) by mouth 2 (two) times daily. 12/29/13   Adrian Prows, MD  mupirocin ointment (BACTROBAN) 2 % Apply 1 application topically 2 (two) times daily. Patient not taking: Reported on 08/01/2017 05/12/17   Tereasa Coop, PA-C  ondansetron (ZOFRAN ODT) 4 MG disintegrating tablet 4mg  ODT q4 hours prn nausea/vomit Patient not taking: Reported on 08/25/2017 08/01/17   Jacqlyn Larsen, PA-C  OVER THE COUNTER MEDICATION Take 1 tablet by mouth daily. Glucose Support    [provider]  tamsulosin (FLOMAX) 0.4 MG CAPS capsule Take 1 capsule (0.4 mg total) by mouth daily. Patient not taking: Reported on 08/01/2017 10/11/16   Wardell Honour, MD    Family History Family History  Problem Relation Age of Onset  . Heart disease Mother   . Stroke Mother   . Heart disease Father   . Diabetes Brother     Social History Social History   Tobacco Use  . Smoking status: Former Research scientist (life sciences)  . Smokeless tobacco: Never Used  . Tobacco comment: "quit smoking in my 20's"  Substance Use Topics  . Alcohol use: Yes    Comment: "stopped drinking in the 1970's"  . Drug use: No     Allergies   Lipitor [atorvastatin calcium]   Review of Systems Review of Systems  All other systems reviewed and are negative.    Physical Exam Updated Vital Signs BP 127/77 (BP Location: Right Arm)   Pulse 91   Temp (!) 97.5 F (36.4 C)  (Oral)   Resp 16   SpO2 96%   Physical Exam  Constitutional: He is oriented to person, place, and time. He appears well-developed and well-nourished.  Non-toxic appearance. No distress.  HENT:  Head: Normocephalic and atraumatic.  Mouth/Throat: Oropharynx is clear and moist.  Eyes: Pupils are equal, round, and reactive to light. Conjunctivae are normal. Right eye exhibits no discharge. Left eye exhibits no discharge.  Neck: Normal range of motion. Neck supple.  Cardiovascular: Normal rate, regular rhythm, normal heart sounds and intact distal pulses. Exam reveals no gallop and no friction rub.  No murmur heard. Pulmonary/Chest: Effort normal and breath sounds normal. No respiratory distress. He exhibits no tenderness.  Abdominal: Soft. Normal appearance and bowel sounds are normal. He exhibits no distension. There is no tenderness. There is no rigidity, no rebound, no guarding, no CVA tenderness and negative Murphy's sign.  Musculoskeletal: Normal range of motion. He exhibits no tenderness.  Lymphadenopathy:    He has no cervical adenopathy.  Neurological: He is alert and  oriented to person, place, and time.  Skin: Skin is warm and dry. Capillary refill takes less than 2 seconds. No rash noted.  Psychiatric: His behavior is normal. Judgment and thought content normal.  Nursing note and vitals reviewed.    ED Treatments / Results  Labs (all labs ordered are listed, but only abnormal results are displayed) Labs Reviewed  URINE CULTURE - Abnormal; Notable for the following components:      Result Value   Culture MULTIPLE SPECIES PRESENT, SUGGEST RECOLLECTION (*)    All other components within normal limits  CBC WITH DIFFERENTIAL/PLATELET - Abnormal; Notable for the following components:   RBC 4.18 (*)    HCT 38.6 (*)    All other components within normal limits  COMPREHENSIVE METABOLIC PANEL - Abnormal; Notable for the following components:   Glucose, Bld 106 (*)    Total  Bilirubin 1.6 (*)    All other components within normal limits  URINALYSIS, ROUTINE W REFLEX MICROSCOPIC - Abnormal; Notable for the following components:   Specific Gravity, Urine >1.046 (*)    Hgb urine dipstick LARGE (*)    Ketones, ur 20 (*)    RBC / HPF >50 (*)    Bacteria, UA RARE (*)    All other components within normal limits  LIPASE, BLOOD    EKG None  Radiology Ct Abdomen Pelvis W Contrast  Result Date: 09/24/2017 CLINICAL DATA:  Acute abdominal pain. EXAM: CT ABDOMEN AND PELVIS WITH CONTRAST TECHNIQUE: Multidetector CT imaging of the abdomen and pelvis was performed using the standard protocol following bolus administration of intravenous contrast. CONTRAST:  119mL ISOVUE-300 IOPAMIDOL (ISOVUE-300) INJECTION 61% COMPARISON:  CT 08/01/2017 FINDINGS: Lower chest: Minimal patchy left basilar atelectasis. Bilateral lower lobe bronchiectasis, chronic. No pleural fluid. Cardiomegaly with coronary artery calcifications. Hepatobiliary: No focal hepatic lesion. Partially contracted gallbladder with ill-defined intraluminal densities. No pericholecystic inflammation. No biliary dilatation. Pancreas: Parenchymal atrophy. No ductal dilatation or inflammation. Mild motion artifact through the pancreas limits detailed assessment. Spleen: Normal in size without focal abnormality. Adrenals/Urinary Tract: Normal adrenal glands. A 9 mm stone previously in the right proximal ureter has migrated and is now in the mid ureter. There is proximal right hydroureteronephrosis. Slight delayed excretion on delayed phase imaging from the right kidney. Additional nonobstructing stones in both kidneys. Bilateral renal cysts and low-density lesions too small to characterize. Urinary bladder is physiologically distended, no bladder wall thickening. Stomach/Bowel: Patient motion artifact partially limits bowel evaluation. No evidence bowel wall thickening, inflammatory change or obstruction. Distal colonic  diverticulosis without diverticulitis. Cecum is high-riding in the right upper abdomen, unchanged. Normal appendix in the right anterior mid/upper abdomen, for example image 33. Vascular/Lymphatic: Aortic atherosclerosis. No aneurysm or acute vascular finding. No enlarged abdominal or pelvic lymph nodes. Reproductive: Prominent prostate gland, similar to prior. Other: No free air, free fluid, or intra-abdominal fluid collection. Prior left inguinal hernia repair. Musculoskeletal: Multilevel degenerative change throughout the spine. Lucency within L4 vertebral body is unchanged. IMPRESSION: 1. Previous 9 stone in the right proximal ureter has progressed, currently in the mid ureter with mild proximal hydroureteronephrosis. Additional bilateral nonobstructing renal calculi. 2. Colonic diverticulosis without diverticulitis. Moderate stool burden throughout the colon. No colonic inflammation, fecal impaction or obstruction. 3. Unchanged lucent area in L4 vertebral body over the past 6 weeks, favored to represent a large Schmorl's node. As mentioned previously, consider nonemergent MRI lumbar spine for further evaluation. 4.  Aortic Atherosclerosis (ICD10-I70.0). Electronically Signed   By: Fonnie Birkenhead.D.  On: 09/24/2017 03:03    Procedures Procedures (including critical care time)  Medications Ordered in ED Medications - No data to display   Initial Impression / Assessment and Plan / ED Course  I have reviewed the triage vital signs and the nursing notes.  Pertinent labs & imaging results that were available during my care of the patient were reviewed by me and considered in my medical decision making (see chart for details).     Patient resents to the ED for evaluation of constipation.  History of same.  Denies any other associated symptoms.  Vital signs reassuring in the emergency department.  Patient appears comfortable but is frustrated about his weight time.  On exam patient has no focal  abdominal tenderness.  Bowel sounds present in all 4 quadrants.  Lab work reassuring.  No leukocytosis.  Normal hemoglobin.  No significant electrolyte derangement.  UA with WBCs and RBCs but no significant amount of bacteria and is nitrite negative.  Lipase normal.  Ct abd/pelvis;   IMPRESSION: 1. Previous 9 stone in the right proximal ureter has progressed, currently in the mid ureter with mild proximal hydroureteronephrosis. Additional bilateral nonobstructing renal calculi. 2. Colonic diverticulosis without diverticulitis. Moderate stool burden throughout the colon. No colonic inflammation, fecal impaction or obstruction. 3. Unchanged lucent area in L4 vertebral body over the past 6 weeks, favored to represent a large Schmorl's node. As mentioned previously, consider nonemergent MRI lumbar spine for further evaluation. 4.  Aortic Atherosclerosis   The kidney stone was seen on prior CT scan.  Patient will be started back on Flomax and to follow-up with urology.  Patient has no signs of infected stone.  He is afebrile.  Pain seems to be managed.  No intractable vomiting.  Constipation noted without any fecal impaction or obstruction.  We will give glycerin suppository and continue MiraLAX.  Patient also encouraged to follow-up with the finding of his lumbar vertebral body.  Pt is hemodynamically stable, in NAD, & able to ambulate in the ED. Evaluation does not show pathology that would require ongoing emergent intervention or inpatient treatment. I explained the diagnosis to the patient. Pain has been managed & has no complaints prior to dc. Pt is comfortable with above plan and is stable for discharge at this time. All questions were answered prior to disposition. Strict return precautions for f/u to the ED were discussed. Encouraged follow up with PCP.  Patient seen and evaluated by my attending who is agreed with the above pain.  Final Clinical Impressions(s) / ED Diagnoses   Final diagnoses:    Constipation, unspecified constipation type  Ureterolithiasis    ED Discharge Orders        Ordered    tamsulosin (FLOMAX) 0.4 MG CAPS capsule  Daily,   Status:  Discontinued     09/24/17 0418    glycerin adult 2 g suppository  As needed,   Status:  Discontinued     09/24/17 0418    glycerin adult 2 g suppository  As needed     09/24/17 0616    tamsulosin (FLOMAX) 0.4 MG CAPS capsule  Daily     09/24/17 0616       Doristine Devoid, PA-C 09/25/17 2232    Varney Biles, MD 09/25/17 2257

## 2017-09-25 DIAGNOSIS — N1330 Unspecified hydronephrosis: Secondary | ICD-10-CM | POA: Diagnosis not present

## 2017-09-25 DIAGNOSIS — E1169 Type 2 diabetes mellitus with other specified complication: Secondary | ICD-10-CM | POA: Diagnosis not present

## 2017-09-25 DIAGNOSIS — D692 Other nonthrombocytopenic purpura: Secondary | ICD-10-CM | POA: Diagnosis not present

## 2017-09-25 DIAGNOSIS — E7800 Pure hypercholesterolemia, unspecified: Secondary | ICD-10-CM | POA: Diagnosis not present

## 2017-09-25 DIAGNOSIS — Z87898 Personal history of other specified conditions: Secondary | ICD-10-CM | POA: Diagnosis not present

## 2017-09-25 DIAGNOSIS — I10 Essential (primary) hypertension: Secondary | ICD-10-CM | POA: Diagnosis not present

## 2017-09-25 DIAGNOSIS — I255 Ischemic cardiomyopathy: Secondary | ICD-10-CM | POA: Diagnosis not present

## 2017-09-25 DIAGNOSIS — K5900 Constipation, unspecified: Secondary | ICD-10-CM | POA: Diagnosis not present

## 2017-09-25 DIAGNOSIS — J45909 Unspecified asthma, uncomplicated: Secondary | ICD-10-CM | POA: Diagnosis not present

## 2017-09-25 LAB — URINE CULTURE

## 2017-09-26 DIAGNOSIS — N201 Calculus of ureter: Secondary | ICD-10-CM | POA: Diagnosis not present

## 2017-10-15 ENCOUNTER — Emergency Department (HOSPITAL_COMMUNITY)
Admission: EM | Admit: 2017-10-15 | Discharge: 2017-10-15 | Disposition: A | Discharge status: Home / Self Care | Payer: Medicare Other | Attending: Emergency Medicine | Admitting: Emergency Medicine

## 2017-10-15 ENCOUNTER — Encounter (HOSPITAL_COMMUNITY): Payer: Self-pay

## 2017-10-15 DIAGNOSIS — I509 Heart failure, unspecified: Secondary | ICD-10-CM | POA: Insufficient documentation

## 2017-10-15 DIAGNOSIS — K5900 Constipation, unspecified: Secondary | ICD-10-CM | POA: Diagnosis not present

## 2017-10-15 DIAGNOSIS — R1084 Generalized abdominal pain: Secondary | ICD-10-CM | POA: Diagnosis not present

## 2017-10-15 DIAGNOSIS — J45909 Unspecified asthma, uncomplicated: Secondary | ICD-10-CM | POA: Insufficient documentation

## 2017-10-15 DIAGNOSIS — I110 Hypertensive heart disease with heart failure: Secondary | ICD-10-CM | POA: Diagnosis not present

## 2017-10-15 DIAGNOSIS — I2510 Atherosclerotic heart disease of native coronary artery without angina pectoris: Secondary | ICD-10-CM | POA: Insufficient documentation

## 2017-10-15 DIAGNOSIS — E119 Type 2 diabetes mellitus without complications: Secondary | ICD-10-CM | POA: Diagnosis not present

## 2017-10-15 DIAGNOSIS — Z79899 Other long term (current) drug therapy: Secondary | ICD-10-CM | POA: Diagnosis not present

## 2017-10-15 DIAGNOSIS — Z87891 Personal history of nicotine dependence: Secondary | ICD-10-CM | POA: Diagnosis not present

## 2017-10-15 DIAGNOSIS — Z743 Need for continuous supervision: Secondary | ICD-10-CM | POA: Diagnosis not present

## 2017-10-15 DIAGNOSIS — R52 Pain, unspecified: Secondary | ICD-10-CM | POA: Diagnosis not present

## 2017-10-15 DIAGNOSIS — R279 Unspecified lack of coordination: Secondary | ICD-10-CM | POA: Diagnosis not present

## 2017-10-15 DIAGNOSIS — R110 Nausea: Secondary | ICD-10-CM | POA: Diagnosis not present

## 2017-10-15 MED ORDER — DOCUSATE SODIUM 100 MG PO CAPS: 100.0000 mg | ORAL_CAPSULE | Freq: Every day | ORAL | 0 refills | Status: AC

## 2017-10-15 MED ORDER — ONDANSETRON 4 MG PO TBDP: 4.0000 mg | ORAL_TABLET | Freq: Three times a day (TID) | ORAL | 0 refills | Status: DC | PRN

## 2017-10-15 MED ORDER — POLYETHYLENE GLYCOL 3350 17 G PO PACK: 17.0000 g | PACK | Freq: Every day | ORAL | 0 refills | Status: DC

## 2017-10-15 NOTE — ED Notes (Signed)
Pt. Denied abdominal pain ,stated that he is just constipated and wanted to have enemas. A/o x 4.

## 2017-10-15 NOTE — ED Notes (Signed)
Patient out of ED via [facility] in no distress.  

## 2017-10-15 NOTE — ED Notes (Signed)
Report called back to  [facility]. [facility] contacted for transport.

## 2017-10-15 NOTE — ED Triage Notes (Signed)
Pt complains of abdominal pain and constipation for two days, pt also states he's been vomiting for two days also

## 2017-10-15 NOTE — Discharge Instructions (Signed)
Use miralax and docusate daily.  Make sure you are staying well hydrated with water.  Do not take any more immodium.  Use zofran as needed for nausea or vomiting.  It is very important that you follow up with your primary care doctor in the next few days to discuss your constipation.  Return to the ER if you develop any severe abdominal pain, fevers, blood in your stool, or any new or concerning symptoms.

## 2017-10-15 NOTE — ED Provider Notes (Signed)
[facility] DEPT Provider Note   CSN: [id number] Arrival date & time: 10/15/17  0610     History   Chief Complaint Chief Complaint  Patient presents with  . Abdominal Pain    HPI [name] [name] is a 84 y.o. male presenting for evaluation of constipation.  Patient states he has been struggling with constipation for the past several months.  He states the past 2 days, he has felt more constipated than normal.  He reports discomfort when he eats due to this constipation.  He denies abdominal pain.  He has been taking different medicines to help with constipation intermittently, often for 1 dose before discontinuing.  Patient is requesting an enema, has not tried at the assisted living.  He has not followed up with his primary care doctor.  Pt denies fevers, chills, chest pain, shortness of breath, abdominal pain, or urinary symptoms.  Additional history obtained from chart review.  Patient has been seen twice in the ER for constipation in the past several months, each time with a CT scan which showed constipation without other acute concerning findings.  No signs of obstruction.  Additionally, patient has had some psychiatric illness.  Patient has reported constipation in the past, when facility staff stated that he was having normal bowel movements.   HPI  Past Medical History:  Diagnosis Date  . Anxiety   . Asthma    "all my life"  . BPH (benign prostatic hyperplasia)    "even now; after my OR" (12/28/2013)  . CHF (congestive heart failure) ([place])   . Chronic bronchitis ([place])    "often"  . Coronary artery disease   . Depression   . Diabetes mellitus without complication ([place])    Type II  . Diverticulosis of colon (without mention of hemorrhage)   . GERD (gastroesophageal reflux disease)   . Hiatal hernia   . High cholesterol   . Hypertension   . Kidney stone    "I've had 7 over the years" (12/28/2013)  . Pneumonia 1930's  . Rhinitis      Patient Active Problem List   Diagnosis Date Noted  . S/P PTCA (percutaneous transluminal coronary angioplasty) 12/28/2013  . CAD (coronary artery disease), native coronary artery 12/26/2013  . Postsurgical percutaneous transluminal coronary angioplasty (PTCA) status 11/25/2013  . ACUTE ATOPIC CONJUNCTIVITIS 01/08/2007  . ACUTE BRONCHITIS 01/08/2007  . INSOMNIA-SLEEP DISORDER-UNSPEC 12/19/2006  . ANXIETY 12/10/2006  . DEPRESSION 12/10/2006  . TINNITUS, CHRONIC 12/10/2006  . ALLERGIC RHINITIS 12/10/2006  . BENIGN PROSTATIC HYPERTROPHY 12/10/2006  . WEIGHT LOSS 12/10/2006  . GLUCOSE INTOLERANCE, MINIMAL, HX OF 12/10/2006  . NEPHROLITHIASIS, HX OF 12/10/2006  . RHINITIS, ALLERGIC NEC 12/09/2006  . Unspecified asthma(493.90) 12/09/2006  . COLONIC POLYPS 11/24/2006  . DIABETES MELLITUS, TYPE II 11/24/2006  . HYPERLIPIDEMIA 11/24/2006  . HYPERTENSION 11/24/2006  . GERD 11/24/2006  . Diverticulosis of colon (without mention of hemorrhage) 11/24/2006  . CALCULUS, KIDNEY 11/24/2006  . BENIGN PROSTATIC HYPERTROPHY, HX OF 11/24/2006    Past Surgical History:  Procedure Laterality Date  . COLONOSCOPY W/ BIOPSIES AND POLYPECTOMY    . CORONARY ANGIOPLASTY WITH STENT PLACEMENT  11/25/2013; 12/28/2013   "1; ?2"  . CYSTOSCOPY W/ LITHOLAPAXY / EHL  X 2  . INGUINAL HERNIA REPAIR Left 1962  . INGUINAL HERNIA REPAIR Right 1980  . LEFT HEART CATHETERIZATION WITH CORONARY ANGIOGRAM N/A 11/25/2013   Procedure: LEFT HEART CATHETERIZATION WITH CORONARY ANGIOGRAM;  Surgeon: Laverda Page, MD;  Location: [facility] CATH LAB;  Service: Cardiovascular;  Laterality: N/A;  . PERCUTANEOUS CORONARY STENT INTERVENTION (PCI-S)  11/25/2013   Procedure: PERCUTANEOUS CORONARY STENT INTERVENTION (PCI-S);  Surgeon: Laverda Page, MD;  Location: [facility] CATH LAB;  Service: Cardiovascular;;  . PERCUTANEOUS CORONARY STENT INTERVENTION (PCI-S) N/A 12/28/2013   Procedure: PERCUTANEOUS CORONARY STENT INTERVENTION (PCI-S);   Surgeon: Laverda Page, MD;  Location: [facility] CATH LAB;  Service: Cardiovascular;  Laterality: N/A;  . PROSTATE SURGERY    . TONSILLECTOMY  1937  . TRANSURETHRAL RESECTION OF PROSTATE  ~ 2007        Home Medications    Prior to Admission medications   Medication Sig Start Date End Date Taking? Authorizing Provider  acetaminophen (TYLENOL) 325 MG tablet Take 650 mg by mouth every 4 (four) hours as needed for mild pain.    [provider]  alum & mag hydroxide-simeth (MAALOX PLUS) 400-400-40 MG/5ML suspension Take 15 mLs by mouth every 4 (four) hours as needed for indigestion.    [provider]  clopidogrel (PLAVIX) 75 MG tablet Take 1 tablet (75 mg total) by mouth daily with breakfast. 11/26/13   Adrian Prows, MD  Clotrimazole 1 % OINT Apply 1 application topically 3 (three) times daily. Patient not taking: Reported on 08/25/2017 05/16/17   Tereasa Coop, PA-C  docusate sodium (COLACE) 100 MG capsule Take 1 capsule (100 mg total) by mouth daily. 10/15/17   Aryiana Klinkner, PA-C  glycerin adult 2 g suppository Place 1 suppository rectally as needed for constipation. 09/24/17   Doristine Devoid, PA-C  ibuprofen (ADVIL,MOTRIN) 200 MG tablet Take 400 mg by mouth every 6 (six) hours as needed for moderate pain.    [provider]  magnesium hydroxide (MILK OF MAGNESIA) 400 MG/5ML suspension Take 30 mLs by mouth daily as needed for mild constipation.    [provider]  metoprolol tartrate (LOPRESSOR) 25 MG tablet Take 1 tablet (25 mg total) by mouth 2 (two) times daily. Patient not taking: Reported on 09/24/2017 12/29/13   Adrian Prows, MD  mupirocin ointment (BACTROBAN) 2 % Apply 1 application topically 2 (two) times daily. Patient not taking: Reported on 08/01/2017 05/12/17   Tereasa Coop, PA-C  ondansetron (ZOFRAN ODT) 4 MG disintegrating tablet Take 1 tablet (4 mg total) by mouth every 8 (eight) hours as needed for nausea or vomiting. 10/15/17   Sol Englert,  Cortana Vanderford, PA-C  polyethylene glycol (MIRALAX) packet Take 17 g by mouth daily. 10/15/17   Nazier Neyhart, PA-C  tamsulosin (FLOMAX) 0.4 MG CAPS capsule Take 1 capsule (0.4 mg total) by mouth daily. 09/24/17   Doristine Devoid, PA-C    Family History Family History  Problem Relation Age of Onset  . Heart disease Mother   . Stroke Mother   . Heart disease Father   . Diabetes Brother     Social History Social History   Tobacco Use  . Smoking status: Former Research scientist (life sciences)  . Smokeless tobacco: Never Used  . Tobacco comment: "quit smoking in my 20's"  Substance Use Topics  . Alcohol use: Yes    Comment: "stopped drinking in the 1970's"  . Drug use: No     Allergies   Lipitor [atorvastatin calcium]   Review of Systems Review of Systems  Gastrointestinal: Positive for constipation and nausea.  All other systems reviewed and are negative.    Physical Exam Updated Vital Signs BP 110/72   Pulse 84   Temp 98.9 F (37.2 C) (Oral)   Resp 18   SpO2  98%   Physical Exam  Constitutional: He is oriented to person, place, and time. He appears well-developed and well-nourished. No distress.  Elderly male laying comfortably in the bed in NAD  HENT:  Head: Normocephalic and atraumatic.  Eyes: Pupils are equal, round, and reactive to light. Conjunctivae and EOM are normal.  Neck: Normal range of motion. Neck supple.  Cardiovascular: Normal rate, regular rhythm and intact distal pulses.  Pulmonary/Chest: Effort normal and breath sounds normal. No respiratory distress. He has no wheezes.  Abdominal: Soft. Bowel sounds are normal. He exhibits no distension and no mass. There is no tenderness. There is no rebound and no guarding.  No tenderness palpation of the abdomen.  Soft without rigidity, guarding, distention.  Negative rebound.  No signs of peritonitis.  No obvious masses.  Musculoskeletal: Normal range of motion.  Neurological: He is alert and oriented to person, place, and time.    Skin: Skin is warm and dry. Capillary refill takes less than 2 seconds.  Psychiatric: He has a normal mood and affect.  Nursing note and vitals reviewed.    ED Treatments / Results  Labs (all labs ordered are listed, but only abnormal results are displayed) Labs Reviewed - No data to display  EKG None  Radiology No results found.  Procedures Procedures (including critical care time)  Medications Ordered in ED Medications - No data to display   Initial Impression / Assessment and Plan / ED Course  I have reviewed the triage vital signs and the nursing notes.  Pertinent labs & imaging results that were available during my care of the patient were reviewed by me and considered in my medical decision making (see chart for details).     Patient presenting for evaluation of constipation.  Physical exam reassuring, he is afebrile not tachycardic.  Appears nontoxic.  No tenderness palpation of the abdomen, and overall abdominal exam reassuring.  Chart reviewed, patient with multiple recent CTs without signs of a surgical abdomen.  Patient has been taking medications for constipation intermittently, often for only 1 dose.  Discussed with patient importance of taking scheduled laxatives/stool softeners.  Discussed importance of hydration.  Patient to follow-up with his primary care as needed for further evaluation of constipation. Case discussed with attending, Dr. Wilson Singer evaluated the pt and agrees to plan. Will not get repeat CT, as I doubt acute surgical abd considering pt's reassuring exam and previous imaging. Will not obtain labs, as I do not for see change in management.  At this time, patient appears safe for discharge.  Return precautions given.  Patient states he understands and agrees to plan.   Final Clinical Impressions(s) / ED Diagnoses   Final diagnoses:  Constipation, unspecified constipation type    ED Discharge Orders         Ordered    docusate sodium (COLACE) 100  MG capsule  Daily     10/15/17 0737    polyethylene glycol (MIRALAX) packet  Daily     10/15/17 0737    ondansetron (ZOFRAN ODT) 4 MG disintegrating tablet  Every 8 hours PRN     10/15/17 0737           Franchot Heidelberg, PA-C 10/15/17 1336    Virgel Manifold, MD 10/16/17 1148

## 2017-10-29 DIAGNOSIS — F320 Major depressive disorder, single episode, mild: Secondary | ICD-10-CM | POA: Diagnosis not present

## 2017-10-29 DIAGNOSIS — I700 Atherosclerosis of aorta: Secondary | ICD-10-CM | POA: Diagnosis not present

## 2017-10-29 DIAGNOSIS — E785 Hyperlipidemia, unspecified: Secondary | ICD-10-CM | POA: Diagnosis not present

## 2017-10-29 DIAGNOSIS — I2510 Atherosclerotic heart disease of native coronary artery without angina pectoris: Secondary | ICD-10-CM | POA: Diagnosis not present

## 2017-10-29 DIAGNOSIS — N400 Enlarged prostate without lower urinary tract symptoms: Secondary | ICD-10-CM | POA: Diagnosis not present

## 2017-10-29 DIAGNOSIS — I10 Essential (primary) hypertension: Secondary | ICD-10-CM | POA: Diagnosis not present

## 2017-10-29 DIAGNOSIS — K5900 Constipation, unspecified: Secondary | ICD-10-CM | POA: Diagnosis not present

## 2017-10-29 DIAGNOSIS — I255 Ischemic cardiomyopathy: Secondary | ICD-10-CM | POA: Diagnosis not present

## 2017-10-29 DIAGNOSIS — E1169 Type 2 diabetes mellitus with other specified complication: Secondary | ICD-10-CM | POA: Diagnosis not present

## 2017-10-29 DIAGNOSIS — E441 Mild protein-calorie malnutrition: Secondary | ICD-10-CM | POA: Diagnosis not present

## 2017-10-29 DIAGNOSIS — I509 Heart failure, unspecified: Secondary | ICD-10-CM | POA: Diagnosis not present

## 2017-11-17 ENCOUNTER — Encounter (HOSPITAL_COMMUNITY): Payer: Self-pay | Admitting: Emergency Medicine

## 2017-11-17 ENCOUNTER — Emergency Department (HOSPITAL_COMMUNITY): Payer: Medicare Other

## 2017-11-17 ENCOUNTER — Emergency Department (HOSPITAL_COMMUNITY)
Admission: EM | Admit: 2017-11-17 | Discharge: 2017-11-17 | Disposition: A | Discharge status: Home / Self Care | Payer: Medicare Other | Attending: Emergency Medicine | Admitting: Emergency Medicine

## 2017-11-17 DIAGNOSIS — Z87891 Personal history of nicotine dependence: Secondary | ICD-10-CM | POA: Insufficient documentation

## 2017-11-17 DIAGNOSIS — I509 Heart failure, unspecified: Secondary | ICD-10-CM | POA: Insufficient documentation

## 2017-11-17 DIAGNOSIS — I110 Hypertensive heart disease with heart failure: Secondary | ICD-10-CM | POA: Diagnosis not present

## 2017-11-17 DIAGNOSIS — E119 Type 2 diabetes mellitus without complications: Secondary | ICD-10-CM | POA: Insufficient documentation

## 2017-11-17 DIAGNOSIS — I2510 Atherosclerotic heart disease of native coronary artery without angina pectoris: Secondary | ICD-10-CM | POA: Insufficient documentation

## 2017-11-17 DIAGNOSIS — R109 Unspecified abdominal pain: Secondary | ICD-10-CM | POA: Diagnosis present

## 2017-11-17 DIAGNOSIS — J45909 Unspecified asthma, uncomplicated: Secondary | ICD-10-CM | POA: Diagnosis not present

## 2017-11-17 DIAGNOSIS — N200 Calculus of kidney: Secondary | ICD-10-CM

## 2017-11-17 DIAGNOSIS — N132 Hydronephrosis with renal and ureteral calculous obstruction: Secondary | ICD-10-CM | POA: Diagnosis not present

## 2017-11-17 LAB — COMPREHENSIVE METABOLIC PANEL
ALT: 62 U/L — ABNORMAL HIGH (ref 0–44)
ANION GAP: 9 (ref 5–15)
AST: 34 U/L (ref 15–41)
Albumin: 3.6 g/dL (ref 3.5–5.0)
Alkaline Phosphatase: 76 U/L (ref 38–126)
BUN: 13 mg/dL (ref 8–23)
CHLORIDE: 104 mmol/L (ref 98–111)
CO2: 26 mmol/L (ref 22–32)
Calcium: 9 mg/dL (ref 8.9–10.3)
Creatinine, Ser: 1.32 mg/dL — ABNORMAL HIGH (ref 0.61–1.24)
GFR, EST AFRICAN AMERICAN: 54 mL/min — AB (ref >60)
GFR, EST NON AFRICAN AMERICAN: 47 mL/min — AB (ref >60)
Glucose, Bld: 114 mg/dL — ABNORMAL HIGH (ref 70–99)
Potassium: 4 mmol/L (ref 3.5–5.1)
SODIUM: 139 mmol/L (ref 135–145)
Total Bilirubin: 3.5 mg/dL — ABNORMAL HIGH (ref 0.3–1.2)
Total Protein: 6.4 g/dL — ABNORMAL LOW (ref 6.5–8.1)

## 2017-11-17 LAB — URINALYSIS, ROUTINE W REFLEX MICROSCOPIC
Bacteria, UA: NONE SEEN
Bilirubin Urine: NEGATIVE
Glucose, UA: NEGATIVE mg/dL
KETONES UR: 20 mg/dL — AB
LEUKOCYTES UA: NEGATIVE
Nitrite: NEGATIVE
PROTEIN: NEGATIVE mg/dL
Specific Gravity, Urine: >1.046 — ABNORMAL HIGH (ref 1.005–1.030)
pH: 6 (ref 5.0–8.0)

## 2017-11-17 LAB — CBC
HCT: 37 % — ABNORMAL LOW (ref 39.0–52.0)
HEMOGLOBIN: 12.8 g/dL — AB (ref 13.0–17.0)
MCH: 31.8 pg (ref 26.0–34.0)
MCHC: 34.6 g/dL (ref 30.0–36.0)
MCV: 92 fL (ref 78.0–100.0)
Platelets: 225 10*3/uL (ref 150–400)
RBC: 4.02 MIL/uL — AB (ref 4.22–5.81)
RDW: 15.2 % (ref 11.5–15.5)
WBC: 5.8 10*3/uL (ref 4.0–10.5)

## 2017-11-17 LAB — LIPASE, BLOOD: LIPASE: 103 U/L — AB (ref 11–51)

## 2017-11-17 MED ORDER — IOPAMIDOL (ISOVUE-300) INJECTION 61%
INTRAVENOUS | Status: AC
  Filled 2017-11-17: qty 100

## 2017-11-17 MED ORDER — KETOROLAC TROMETHAMINE 30 MG/ML IJ SOLN
15.0000 mg | Freq: Once | INTRAMUSCULAR | Status: AC
  Administered 2017-11-17: 15 mg via INTRAVENOUS
  Filled 2017-11-17: qty 1

## 2017-11-17 MED ORDER — OXYCODONE-ACETAMINOPHEN 5-325 MG PO TABS: 1.0000 | ORAL_TABLET | Freq: Four times a day (QID) | ORAL | 0 refills | Status: DC | PRN

## 2017-11-17 MED ORDER — IOPAMIDOL (ISOVUE-300) INJECTION 61%
100.0000 mL | Freq: Once | INTRAVENOUS | Status: AC | PRN
  Administered 2017-11-17: 100 mL via INTRAVENOUS

## 2017-11-17 MED ORDER — TAMSULOSIN HCL 0.4 MG PO CAPS
0.4000 mg | ORAL_CAPSULE | Freq: Every day | ORAL | 0 refills | Status: DC
Start: 2017-11-17 — End: 2021-04-11

## 2017-11-17 MED ORDER — FENTANYL CITRATE (PF) 100 MCG/2ML IJ SOLN
50.0000 ug | INTRAMUSCULAR | Status: DC | PRN
  Administered 2017-11-17: 50 ug via INTRAVENOUS
  Filled 2017-11-17: qty 2

## 2017-11-17 MED ORDER — ONDANSETRON HCL 4 MG PO TABS: 4.0000 mg | ORAL_TABLET | Freq: Three times a day (TID) | ORAL | 0 refills | Status: DC | PRN

## 2017-11-17 MED ORDER — LACTATED RINGERS IV BOLUS
1000.0000 mL | Freq: Once | INTRAVENOUS | Status: AC
  Administered 2017-11-17: 1000 mL via INTRAVENOUS

## 2017-11-17 NOTE — ED Provider Notes (Signed)
Emergency Department Provider Note   I have reviewed the triage vital signs and the nursing notes.   HISTORY  Chief Complaint Abdominal Pain and Back Pain   HPI [name] [name] is a 84 y.o. male with multiple medical problems documented below the presents to the emergency department today secondary to flank pain abdominal pain.  Patient states he has a history of kidney stones and is felt similar to those have been going on for quite a while starting this morning.  No urinary or bowel issues.  Pain is improved at this point.  Had problems similar to this in the past.  Rashes or trauma. No other associated or modifying symptoms.    Past Medical History:  Diagnosis Date  . Anxiety   . Asthma    "all my life"  . BPH (benign prostatic hyperplasia)    "even now; after my OR" (12/28/2013)  . CHF (congestive heart failure) ([place])   . Chronic bronchitis ([place])    "often"  . Coronary artery disease   . Depression   . Diabetes mellitus without complication ([place])    Type II  . Diverticulosis of colon (without mention of hemorrhage)   . GERD (gastroesophageal reflux disease)   . Hiatal hernia   . High cholesterol   . Hypertension   . Kidney stone    "I've had 7 over the years" (12/28/2013)  . Pneumonia 1930's  . Rhinitis     Patient Active Problem List   Diagnosis Date Noted  . S/P PTCA (percutaneous transluminal coronary angioplasty) 12/28/2013  . CAD (coronary artery disease), native coronary artery 12/26/2013  . Postsurgical percutaneous transluminal coronary angioplasty (PTCA) status 11/25/2013  . ACUTE ATOPIC CONJUNCTIVITIS 01/08/2007  . ACUTE BRONCHITIS 01/08/2007  . INSOMNIA-SLEEP DISORDER-UNSPEC 12/19/2006  . ANXIETY 12/10/2006  . DEPRESSION 12/10/2006  . TINNITUS, CHRONIC 12/10/2006  . ALLERGIC RHINITIS 12/10/2006  . BENIGN PROSTATIC HYPERTROPHY 12/10/2006  . WEIGHT LOSS 12/10/2006  . GLUCOSE INTOLERANCE, MINIMAL, HX OF 12/10/2006  . NEPHROLITHIASIS, HX OF  12/10/2006  . RHINITIS, ALLERGIC NEC 12/09/2006  . Unspecified asthma(493.90) 12/09/2006  . COLONIC POLYPS 11/24/2006  . DIABETES MELLITUS, TYPE II 11/24/2006  . HYPERLIPIDEMIA 11/24/2006  . HYPERTENSION 11/24/2006  . GERD 11/24/2006  . Diverticulosis of colon (without mention of hemorrhage) 11/24/2006  . CALCULUS, KIDNEY 11/24/2006  . BENIGN PROSTATIC HYPERTROPHY, HX OF 11/24/2006    Past Surgical History:  Procedure Laterality Date  . COLONOSCOPY W/ BIOPSIES AND POLYPECTOMY    . CORONARY ANGIOPLASTY WITH STENT PLACEMENT  11/25/2013; 12/28/2013   "1; ?2"  . CYSTOSCOPY W/ LITHOLAPAXY / EHL  X 2  . INGUINAL HERNIA REPAIR Left 1962  . INGUINAL HERNIA REPAIR Right 1980  . LEFT HEART CATHETERIZATION WITH CORONARY ANGIOGRAM N/A 11/25/2013   Procedure: LEFT HEART CATHETERIZATION WITH CORONARY ANGIOGRAM;  Surgeon: Laverda Page, MD;  Location: [facility] CATH LAB;  Service: Cardiovascular;  Laterality: N/A;  . PERCUTANEOUS CORONARY STENT INTERVENTION (PCI-S)  11/25/2013   Procedure: PERCUTANEOUS CORONARY STENT INTERVENTION (PCI-S);  Surgeon: Laverda Page, MD;  Location: [facility] CATH LAB;  Service: Cardiovascular;;  . PERCUTANEOUS CORONARY STENT INTERVENTION (PCI-S) N/A 12/28/2013   Procedure: PERCUTANEOUS CORONARY STENT INTERVENTION (PCI-S);  Surgeon: Laverda Page, MD;  Location: [facility] CATH LAB;  Service: Cardiovascular;  Laterality: N/A;  . PROSTATE SURGERY    . TONSILLECTOMY  1937  . TRANSURETHRAL RESECTION OF PROSTATE  ~ 2007    Current Outpatient Rx  . Order #: [id number] Class: Historical Med  . Order #:  [id number] Class: Historical Med  . Order #: [id number] Class: Normal  . Order #: [id number] Class: Print  . Order #: [id number] Class: Print  . Order #: [id number] Class: Print  . Order #: [id number] Class: Print  . Order #: [id number] Class: Historical Med  . Order #: [id number] Class: Historical Med  . Order #: [id number] Class: Print  . Order #: [id number] Class: Print  . Order #: [id number] Class:  Print    Allergies Lipitor [atorvastatin calcium]  Family History  Problem Relation Age of Onset  . Heart disease Mother   . Stroke Mother   . Heart disease Father   . Diabetes Brother     Social History Social History   Tobacco Use  . Smoking status: Former Research scientist (life sciences)  . Smokeless tobacco: Never Used  . Tobacco comment: "quit smoking in my 20's"  Substance Use Topics  . Alcohol use: Yes    Comment: "stopped drinking in the 1970's"  . Drug use: No    Review of Systems  All other systems negative except as documented in the HPI. All pertinent positives and negatives as reviewed in the HPI. ____________________________________________   PHYSICAL EXAM:  VITAL SIGNS: ED Triage Vitals  Enc Vitals Group     BP 11/17/17 1031 120/66     Pulse Rate 11/17/17 1031 88     Resp 11/17/17 1031 18     Temp 11/17/17 1031 97.6 F (36.4 C)     Temp Source 11/17/17 1031 Oral     SpO2 11/17/17 1031 99 %     Weight 11/17/17 1031 147 lb (66.7 kg)     Height 11/17/17 1031 5\' 10"  (1.778 m)    Constitutional: Alert and somewhat disoriented. Well appearing and in no acute distress. Eyes: Conjunctivae are normal. PERRL. EOMI. Head: Atraumatic. Nose: No congestion/rhinnorhea. Mouth/Throat: Mucous membranes are moist.  Oropharynx non-erythematous. Neck: No stridor.  No meningeal signs.   Cardiovascular: Normal rate, regular rhythm. Good peripheral circulation. Grossly normal heart sounds.   Respiratory: Normal respiratory effort.  No retractions. Lungs CTAB. Gastrointestinal: Soft and diffuse ttp. No distention.  Musculoskeletal: No lower extremity tenderness nor edema. No gross deformities of extremities. Neurologic:  Normal speech and language. No gross focal neurologic deficits are appreciated.  Skin:  Skin is warm, dry and intact. No rash noted.   ____________________________________________   LABS (all labs ordered are listed, but only abnormal results are displayed)  Labs  Reviewed  LIPASE, BLOOD - Abnormal; Notable for the following components:      Result Value   Lipase 103 (*)    All other components within normal limits  COMPREHENSIVE METABOLIC PANEL - Abnormal; Notable for the following components:   Glucose, Bld 114 (*)    Creatinine, Ser 1.32 (*)    Total Protein 6.4 (*)    ALT 62 (*)    Total Bilirubin 3.5 (*)    GFR calc non Af Amer 47 (*)    GFR calc Af Amer 54 (*)    All other components within normal limits  CBC - Abnormal; Notable for the following components:   RBC 4.02 (*)    Hemoglobin 12.8 (*)    HCT 37.0 (*)    All other components within normal limits  URINALYSIS, ROUTINE W REFLEX MICROSCOPIC - Abnormal; Notable for the following components:   Specific Gravity, Urine >1.046 (*)    Hgb urine dipstick LARGE (*)    Ketones, ur 20 (*)    RBC / HPF >50 (*)    All other components within normal limits  URINE CULTURE   ____________________________________________  RADIOLOGY  Ct Abdomen Pelvis W Contrast  Result Date: 11/17/2017 CLINICAL DATA:  Mid back pain, RIGHT lower quadrant and RIGHT back pain since last night EXAM: CT ABDOMEN AND PELVIS WITH CONTRAST TECHNIQUE: Multidetector CT imaging of the abdomen and pelvis was performed using the standard protocol following bolus administration of intravenous contrast. CONTRAST:  115mL ISOVUE-300 IOPAMIDOL (ISOVUE-300) INJECTION 61% COMPARISON:  09/24/2017 FINDINGS: Lower chest: Cylindrical bronchiectasis in BILATERAL lower lobes. Tiny calcified granuloma RIGHT middle lobe image 11 unchanged. Minimal pericardial effusion. Dilatation of the LEFT ventricle with apical thinning and mild ballooning of the LEFT ventricular apex. Hepatobiliary: Contracted gallbladder, wall appearing prominent though this may be an artifact related to underdistention. No focal hepatic lesion or biliary dilatation. Pancreas: Normal appearance Spleen: Normal appearance Adrenals/Urinary Tract: Adrenal glands normal  appearance. BILATERAL renal cysts. Nonobstructing tiny BILATERAL renal calculi. RIGHT hydronephrosis and hydroureter secondary to an 8 mm distal RIGHT ureteral calculus; this calculus has progressed further distally in the RIGHT ureter versus the prior study. Increased RIGHT hydronephrosis and hydroureter as well as new mild impairment of RIGHT nephrogram. No LEFT hydronephrosis or ureteral dilatation. Bladder unremarkable. Stomach/Bowel: Normal appendix in the anterior RIGHT mid abdomen. Redundant cecum. Diverticulosis of descending and sigmoid colon. Stomach decompressed. Remaining bowel loops unremarkable. Vascular/Lymphatic: Atherosclerotic calcifications aorta including the origins of the celiac and superior mesenteric arteries as well as BILATERAL renal arteries. Aorta normal caliber. Coronary arterial calcifications noted. No adenopathy. Reproductive: Mild prostatic enlargement. Other: No free air or free fluid. Mild perinephric edema RIGHT kidney. No hernia. Musculoskeletal: Multilevel degenerative disc disease changes and Schmorl nodes of the lumbar spine. IMPRESSION: Increased RIGHT hydronephrosis, hydroureter, and new mild impairment of RIGHT nephrogram due to an 8 mm distal RIGHT ureteral calculus; this calculus has progressed further distal within the RIGHT ureter since the prior study. Associated mild RIGHT perinephric edema and additional BILATERAL nonobstructing renal calculi. Distal colonic diverticulosis. Contracted gallbladder. Dilatation of the LEFT ventricle with apical thinning and question mild ballooning of the LEFT ventricular apex. Electronically Signed   By: Lavonia Dana M.D.   On: 11/17/2017 12:48    ____________________________________________   PROCEDURES  Procedure(s) performed:   Procedures   ____________________________________________   INITIAL IMPRESSION / ASSESSMENT AND PLAN / ED COURSE  Passed stone versus pancreatitis versus cholecystitis versus appendicitis.   Will evaluate for the same.  No pain at this time.  Does have stone with mild elevation in Creatinine. Pain free currently.  I discussed with Dr. Lovena Neighbours with [facility] who recommends outpatient follow-up at this time.  He also recommended a liter of fluids for the AKI which the patient already received.  Prescription for pain medication and phone number given for follow-up.     Pertinent labs & imaging results that were available during my care of the patient were reviewed by me and considered in my medical decision making (see chart for details).  ____________________________________________  FINAL CLINICAL IMPRESSION(S) / ED DIAGNOSES  Final diagnoses:  Kidney stone     MEDICATIONS GIVEN DURING THIS VISIT:  Medications  fentaNYL (SUBLIMAZE) injection 50 mcg (50 mcg Intravenous Given 11/17/17 1243)  iopamidol (ISOVUE-300) 61 % injection (has no administration in time range)  ketorolac (TORADOL) 30 MG/ML injection 15 mg (has no administration in time range)  lactated ringers bolus 1,000 mL (0 mLs Intravenous Stopped 11/17/17 1533)  iopamidol (ISOVUE-300) 61 % injection 100 mL (100 mLs Intravenous Contrast Given 11/17/17 1217)     NEW OUTPATIENT  MEDICATIONS STARTED DURING THIS VISIT:  New Prescriptions   ONDANSETRON (ZOFRAN) 4 MG TABLET    Take 1 tablet (4 mg total) by mouth every 8 (eight) hours as needed for nausea or vomiting.   OXYCODONE-ACETAMINOPHEN (PERCOCET) 5-325 MG TABLET    Take 1 tablet by mouth every 6 (six) hours as needed for severe pain.   TAMSULOSIN (FLOMAX) 0.4 MG CAPS CAPSULE    Take 1 capsule (0.4 mg total) by mouth daily.    Note:  This note was prepared with assistance of Dragon voice recognition software. Occasional wrong-word or sound-a-like substitutions may have occurred due to the inherent limitations of voice recognition software.   Merrily Pew, MD 11/17/17 [phone]

## 2017-11-17 NOTE — ED Notes (Signed)
Patient was able to urinate. We did not need to In and Out Cath.

## 2017-11-17 NOTE — ED Triage Notes (Signed)
Pt c/o RLQ pain and right back pains that started last night. Denies n/v/d or urinary problems.

## 2017-11-17 NOTE — ED Notes (Signed)
Patient came back from CT w/ a broken belt. Patient stated that belt was broken when pulled up in bed by staff in CT. Staff is assessing weather the patient can be reimbursed for the belt.

## 2017-11-17 NOTE — ED Notes (Signed)
Delayed administering ordered LR due to lack of supply in ED. Pharmacy called x2 to restock LR supply.

## 2017-11-17 NOTE — ED Notes (Signed)
Bed: WA08 Expected date:  Expected time:  Means of arrival:  Comments: 

## 2017-11-19 LAB — URINE CULTURE: Culture: NO GROWTH

## 2018-07-03 DIAGNOSIS — N400 Enlarged prostate without lower urinary tract symptoms: Secondary | ICD-10-CM | POA: Diagnosis not present

## 2018-11-27 DIAGNOSIS — Z20828 Contact with and (suspected) exposure to other viral communicable diseases: Secondary | ICD-10-CM | POA: Diagnosis not present

## 2018-12-02 DIAGNOSIS — Z20828 Contact with and (suspected) exposure to other viral communicable diseases: Secondary | ICD-10-CM | POA: Diagnosis not present

## 2018-12-09 DIAGNOSIS — Z20828 Contact with and (suspected) exposure to other viral communicable diseases: Secondary | ICD-10-CM | POA: Diagnosis not present

## 2018-12-21 DIAGNOSIS — Z20828 Contact with and (suspected) exposure to other viral communicable diseases: Secondary | ICD-10-CM | POA: Diagnosis not present

## 2018-12-30 DIAGNOSIS — Z20828 Contact with and (suspected) exposure to other viral communicable diseases: Secondary | ICD-10-CM | POA: Diagnosis not present

## 2019-01-08 DIAGNOSIS — Z20828 Contact with and (suspected) exposure to other viral communicable diseases: Secondary | ICD-10-CM | POA: Diagnosis not present

## 2019-01-13 DIAGNOSIS — R2681 Unsteadiness on feet: Secondary | ICD-10-CM | POA: Diagnosis not present

## 2019-01-13 DIAGNOSIS — R2689 Other abnormalities of gait and mobility: Secondary | ICD-10-CM | POA: Diagnosis not present

## 2019-01-13 DIAGNOSIS — M6281 Muscle weakness (generalized): Secondary | ICD-10-CM | POA: Diagnosis not present

## 2019-01-18 DIAGNOSIS — M6281 Muscle weakness (generalized): Secondary | ICD-10-CM | POA: Diagnosis not present

## 2019-01-18 DIAGNOSIS — R2681 Unsteadiness on feet: Secondary | ICD-10-CM | POA: Diagnosis not present

## 2019-01-18 DIAGNOSIS — R2689 Other abnormalities of gait and mobility: Secondary | ICD-10-CM | POA: Diagnosis not present

## 2019-01-20 DIAGNOSIS — Z20828 Contact with and (suspected) exposure to other viral communicable diseases: Secondary | ICD-10-CM | POA: Diagnosis not present

## 2019-01-27 DIAGNOSIS — Z20828 Contact with and (suspected) exposure to other viral communicable diseases: Secondary | ICD-10-CM | POA: Diagnosis not present

## 2019-02-03 DIAGNOSIS — Z20828 Contact with and (suspected) exposure to other viral communicable diseases: Secondary | ICD-10-CM | POA: Diagnosis not present

## 2019-02-10 DIAGNOSIS — Z20828 Contact with and (suspected) exposure to other viral communicable diseases: Secondary | ICD-10-CM | POA: Diagnosis not present

## 2019-02-17 DIAGNOSIS — Z1159 Encounter for screening for other viral diseases: Secondary | ICD-10-CM | POA: Diagnosis not present

## 2019-02-24 DIAGNOSIS — Z1159 Encounter for screening for other viral diseases: Secondary | ICD-10-CM | POA: Diagnosis not present

## 2019-05-19 DIAGNOSIS — Z20822 Contact with and (suspected) exposure to covid-19: Secondary | ICD-10-CM | POA: Diagnosis not present

## 2019-05-23 IMAGING — CT CT ABD-PELV W/ CM
2 of 5 series · 16 of 46 positions shown, 18 images · IV contrast (ISOVUE)
Comparison: CT abdomen pelvis dated March 19, 2007.

CLINICAL DATA: Constipation for the past week. Vomiting for the
past 3 days.

EXAM:
CT ABDOMEN AND PELVIS WITH CONTRAST
TECHNIQUE: Multidetector CT imaging of the abdomen and pelvis was performed
using the standard protocol following bolus administration of
intravenous contrast.
CONTRAST:  100mL MMJAUV-1QQ IOPAMIDOL (MMJAUV-1QQ) INJECTION 61%

[Series 2: axial st · axial · 0.96mm/px · z∈[-388,-18]mm · 13 of 86 slices shown, 15 images]
[im 6/86  soft-tissue]
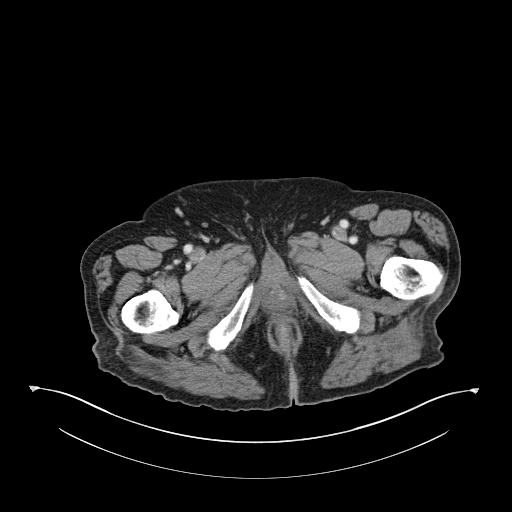
[im 6/86  bone]
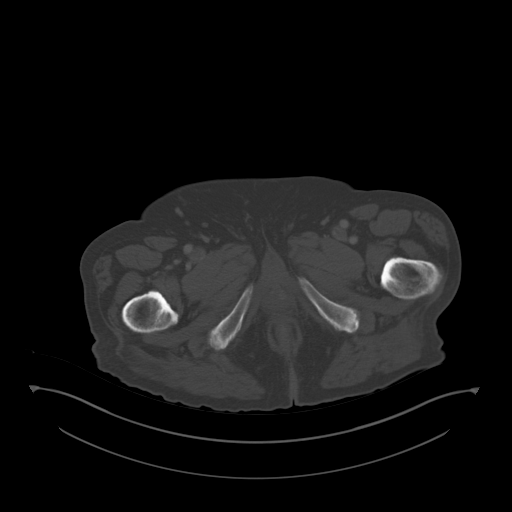
[im 12/86  soft-tissue]
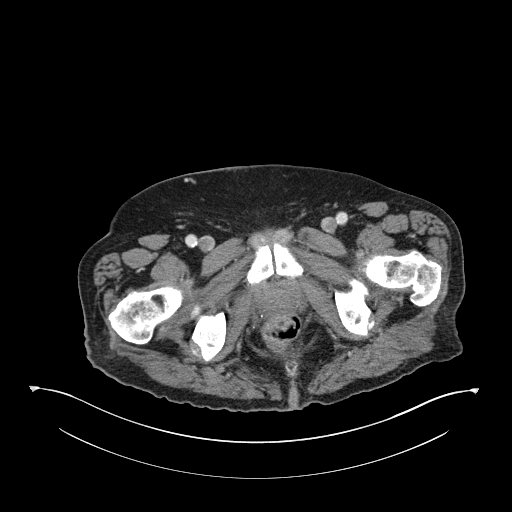
[im 18/86  soft-tissue]
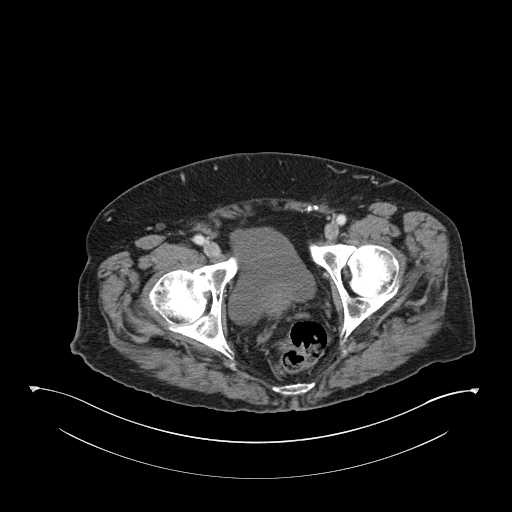
[im 23/86  soft-tissue]
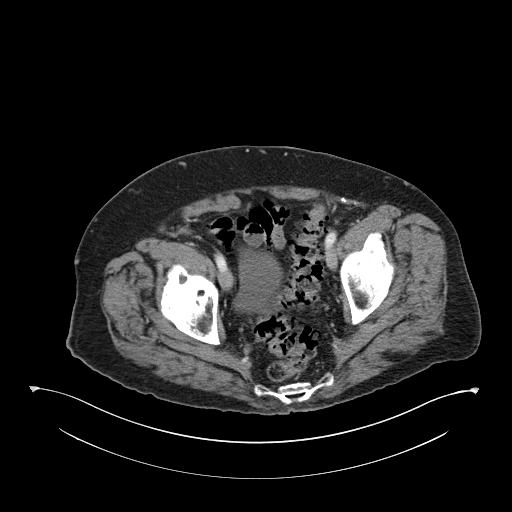
[im 29/86  soft-tissue]
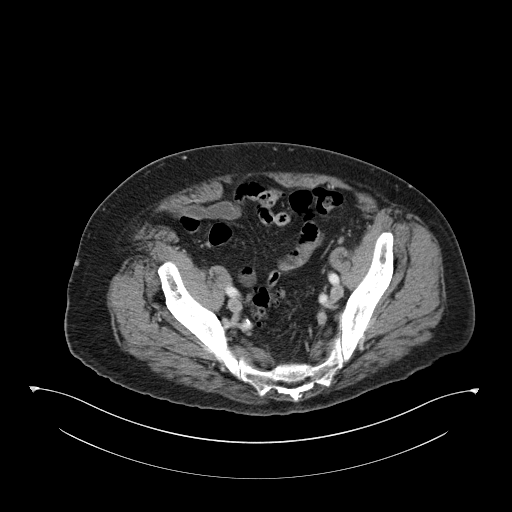
[im 35/86  soft-tissue]
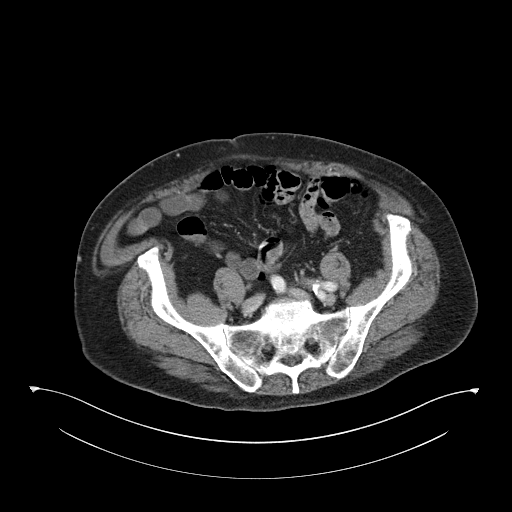
[im 46/86  soft-tissue]
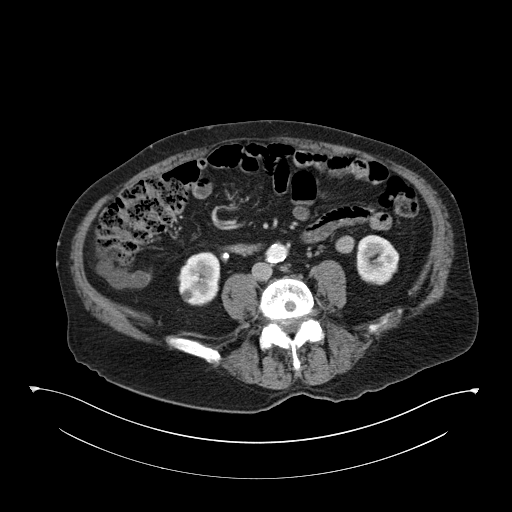
[im 52/86  soft-tissue]
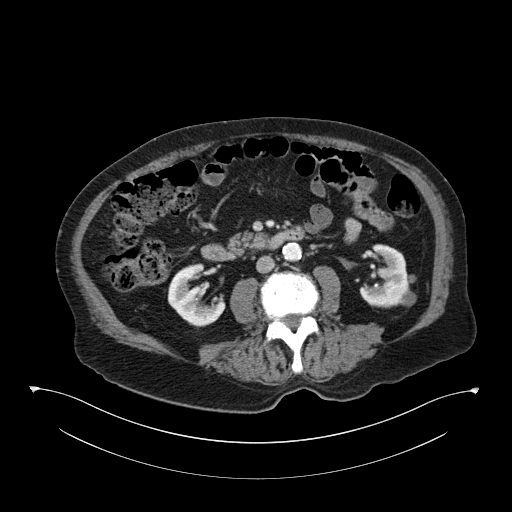
[im 57/86  soft-tissue]
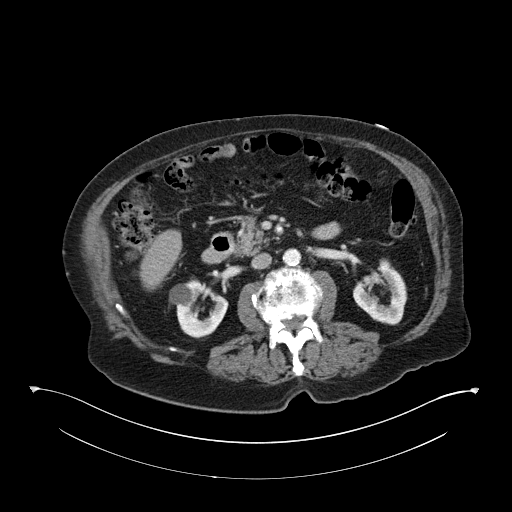
[im 57/86  bone]
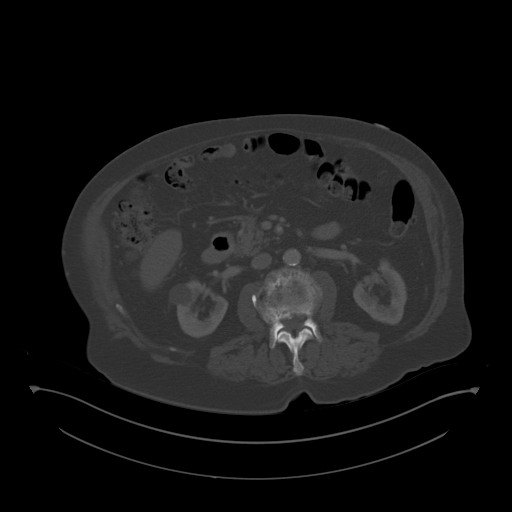
[im 63/86  soft-tissue]
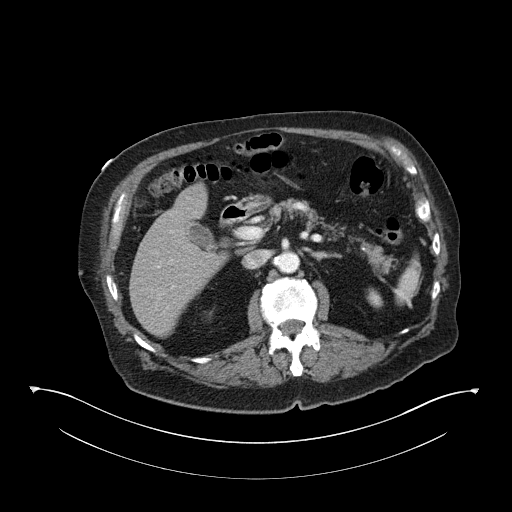
[im 69/86  soft-tissue]
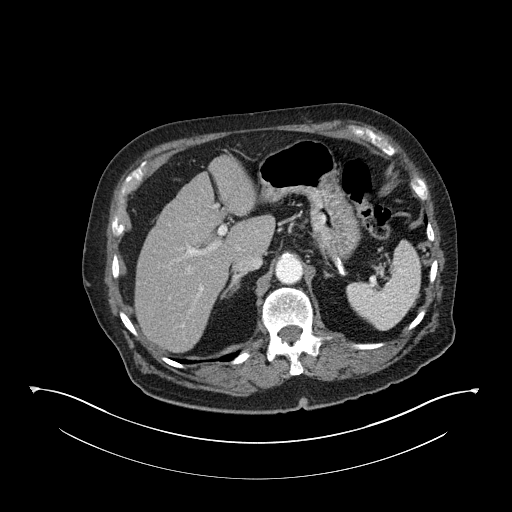
[im 74/86  soft-tissue]
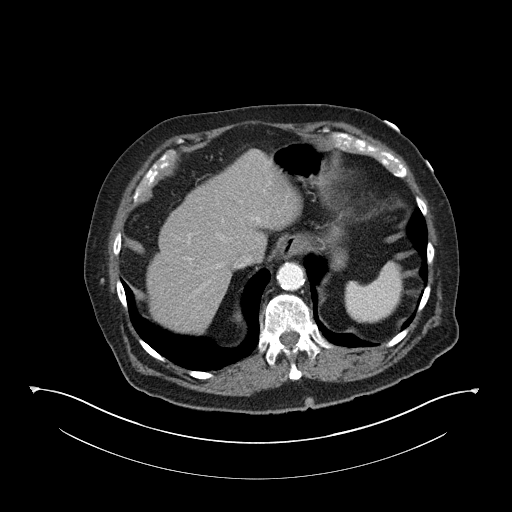
[im 80/86  soft-tissue]
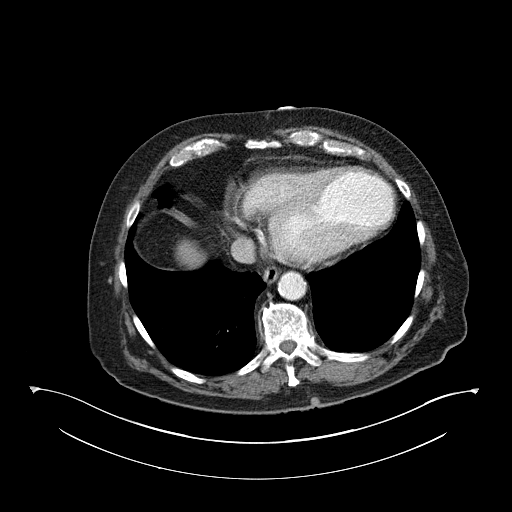

[Series 5: coronal st · coronal · 0.81mm/px · 3 of 101 slices shown]
[im 34/101  soft-tissue]
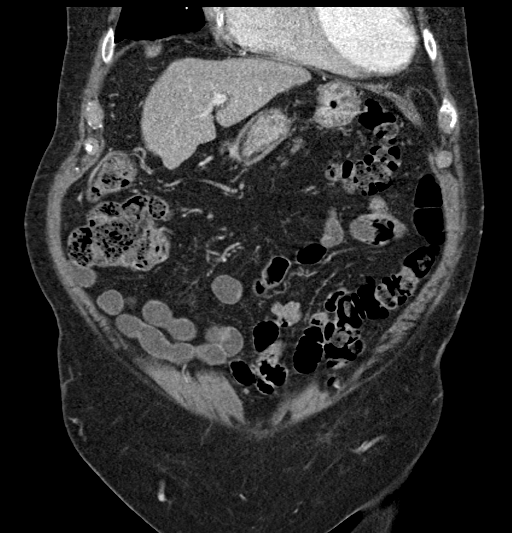
[im 45/101  soft-tissue]
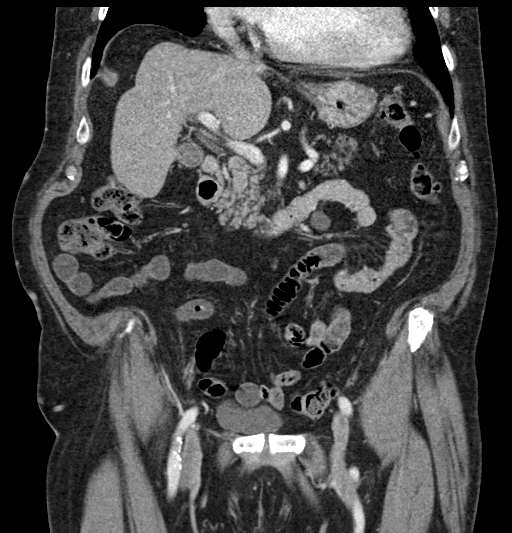
[im 56/101  soft-tissue]
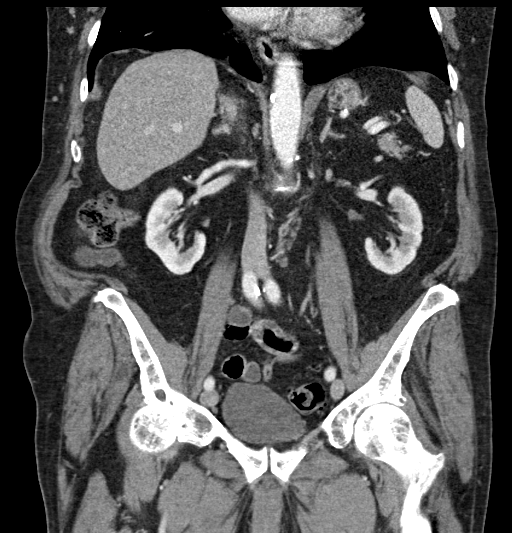

[16 of 46 positions shown; findings below may reference images not displayed]

FINDINGS: Lower chest: No acute abnormality.  Cardiomegaly.

Hepatobiliary: No focal liver abnormality. The gallbladder is
contracted. No biliary dilatation.

Pancreas: Mild-to-moderate atrophy. No ductal dilatation or
surrounding inflammatory changes.

Spleen: Normal in size without focal abnormality.

Adrenals/Urinary Tract: The adrenal glands are unremarkable.
Unchanged bilateral renal cysts. Other bilateral subcentimeter
low-density lesions remain too small to characterize but are stable.
Bilateral punctate nonobstructive calculi. There is an 9 mm calculus
in the right proximal ureter. No hydronephrosis. The bladder is
unremarkable.

Stomach/Bowel: Stomach is within normal limits. Appendix is not
definitively visualized, however there are no signs of inflammation
around the cecum. No evidence of bowel wall thickening, distention,
or inflammatory changes. Sigmoid diverticulosis. Mild colonic stool
burden.

Vascular/Lymphatic: Aortic atherosclerosis. No enlarged abdominal or
pelvic lymph nodes.

Reproductive: Mild prostatomegaly.

Other: No abdominal wall hernia or abnormality. No abdominopelvic
ascites. No pneumoperitoneum.

Musculoskeletal: Lucency involving the L4 vertebral body. No acute
fracture.
IMPRESSION: 1. Mild colonic stool burden, consistent with history of
constipation. No fecal impaction or bowel obstruction.
2. Nonobstructive 9 mm calculus in the right proximal ureter. No
hydronephrosis. Additional bilateral punctate nonobstructive renal
calculi.
3. Lucent area involving the L4 vertebral body is favored to
represent a large Schmorl's node, less likely a discrete mass
lesion. Consider non-emergent MRI of the lumbar spine for further
evaluation.
4.  Aortic atherosclerosis ([id number]).

## 2019-06-16 IMAGING — DX DG CHEST 1V PORT
1 series · 1 of 1 positions shown · non-contrast
Comparison: 10/13/2013

CLINICAL DATA: Altered mental status

EXAM:
PORTABLE CHEST 1 VIEW

[chest ap]
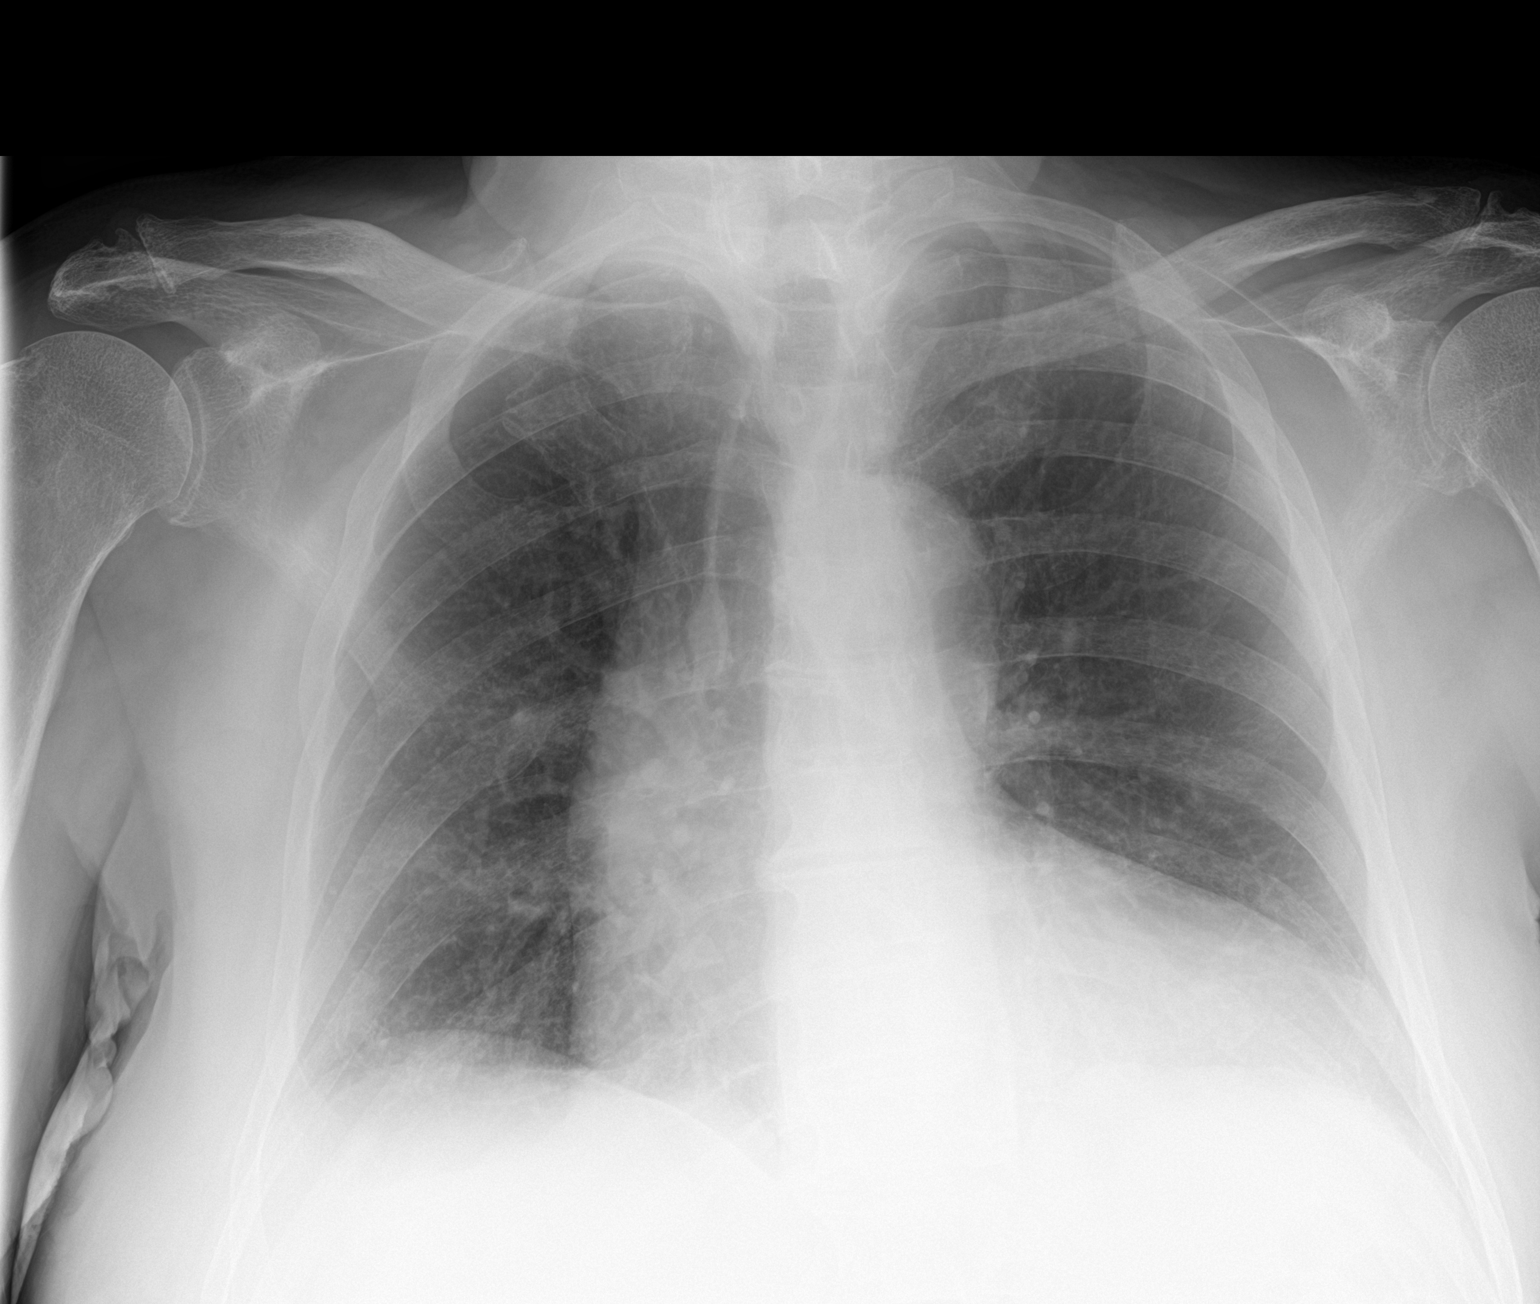

[1 of 1 positions shown; findings below may reference images not displayed]

FINDINGS: Mildly low volume study.

Cardiomegaly noted.

There is no evidence of focal airspace disease, pulmonary edema,
suspicious pulmonary nodule/mass, pleural effusion, or pneumothorax.

No acute bony abnormalities are identified.
IMPRESSION: Cardiomegaly without evidence of acute cardiopulmonary disease.

## 2019-08-03 DIAGNOSIS — R9431 Abnormal electrocardiogram [ECG] [EKG]: Secondary | ICD-10-CM | POA: Diagnosis not present

## 2019-08-03 DIAGNOSIS — I4430 Unspecified atrioventricular block: Secondary | ICD-10-CM | POA: Diagnosis not present

## 2019-08-03 DIAGNOSIS — R079 Chest pain, unspecified: Secondary | ICD-10-CM | POA: Diagnosis not present

## 2019-08-03 DIAGNOSIS — I499 Cardiac arrhythmia, unspecified: Secondary | ICD-10-CM | POA: Diagnosis not present

## 2019-08-03 DIAGNOSIS — R0789 Other chest pain: Secondary | ICD-10-CM | POA: Diagnosis not present

## 2019-08-17 ENCOUNTER — Non-Acute Institutional Stay: Payer: Medicare Other

## 2019-08-17 DIAGNOSIS — Z515 Encounter for palliative care: Secondary | ICD-10-CM

## 2019-08-19 ENCOUNTER — Other Ambulatory Visit: Payer: Self-pay

## 2019-08-19 NOTE — Progress Notes (Signed)
COMMUNITY PALLIATIVE CARE SW NOTE  PATIENT NAME: [name] [name] DOB: 05-27-1928 MRN: [id number]  PRIMARY CARE PROVIDER: Antony Contras, MD  RESPONSIBLE PARTY:  Acct ID - Guarantor Home Phone Work Phone Relationship Acct Type  [id number] [name] [name], [id number]  Self P/F     [address], [place],  [address]     PLAN OF CARE and INTERVENTIONS:             1. GOALS OF CARE/ ADVANCE CARE PLANNING: Goal is for patient to remain at the facility, as independent as possible. Patient is a DNR.  2. SOCIAL/EMOTIONAL/SPIRITUAL ASSESSMENT/ INTERVENTIONS:  SW completed a face-to-face visit with patient at the facility [facility]). Patient was present in his room. SW introduced herself, education regarding the palliative care program, services, role of team and visit frequency. Patient asked SW to talk with his nephew as he was not sure about the program.SW assessed how he thought his status was. He reported that he was eating and sleeping well. SW observed him ambulating in his room independently, but his gait as unsteady and he appeared to be shuffling. He is thin and frail in appearance. He eats his meals in the dinning room. Patient can make his needs known. Patient is fully-vaccinated.  SW consulted with the facility nurse-Kelly who verbalized no concerns regarding patient. SW telephoned patient's niece-Judy to advise her of he referral for palliative care, introduction to the team and services. She was open to ongoing visits by the team.  3. PATIENT/CAREGIVER EDUCATION/ COPING:  Patient's affect is flat, but he is responsive and engaged with SW. He seems to coping well.  4. PERSONAL EMERGENCY PLAN:  Per facility protocol. 5. COMMUNITY RESOURCES COORDINATION/ HEALTH CARE NAVIGATION:  Patient has access to support through the activities department.  6. FINANCIAL/LEGAL CONCERNS/INTERVENTIONS: No financial or legal issues.      SOCIAL HX:  Social History   Tobacco Use  . Smoking status:  Former Research scientist (life sciences)  . Smokeless tobacco: Never Used  . Tobacco comment: "quit smoking in my 20's"  Substance Use Topics  . Alcohol use: Yes    Comment: "stopped drinking in the 1970's"    CODE STATUS: DNR ADVANCED DIRECTIVES: No MOST FORM COMPLETE: No HOSPICE EDUCATION PROVIDED: No  PPS: Patient ambulates independently, but slow and unsteady gait. He is alert and oriented x3.  Duration of visit and documentation:45      Katheren Puller, LCSW

## 2019-08-30 ENCOUNTER — Non-Acute Institutional Stay: Payer: Medicare Other

## 2019-08-30 ENCOUNTER — Other Ambulatory Visit: Payer: Self-pay

## 2019-08-30 DIAGNOSIS — Z515 Encounter for palliative care: Secondary | ICD-10-CM

## 2019-08-31 NOTE — Progress Notes (Signed)
COMMUNITY PALLIATIVE CARE SW NOTE  PATIENT NAME: [name] [name] DOB: 1928-10-22 MRN: [id number]  PRIMARY CARE PROVIDER: Antony Contras, MD  RESPONSIBLE PARTY:  Acct ID - Guarantor Home Phone Work Phone Relationship Acct Type  [id number] DREYTON, [phone]  Self P/F     [address], [place], [place] [address]     PLAN OF CARE and INTERVENTIONS:             1. GOALS OF CARE/ ADVANCE CARE PLANNING:  Goal is for patient to remain at the facility, as independent as possible. Patient is a DNR.  2. SOCIAL/EMOTIONAL/SPIRITUAL ASSESSMENT/ INTERVENTIONS:   SW completed a face-to-face visit with patient at the facility [facility]). Patient was present in his room. Patient was in bed. He stated that he was very tired and was trying to get a nap. He denied pain, but showed SW a small skin tear on his right arm that was covered with a regular bandage. Patient appears thin and frail in appearance. He verbalized appreciation for social worker visit but stated he was doing fine. SW consulted with the med tech-Sam. Sam reported that patient spends most of his time in his room or ambulating up and down the halls. He does got down for meals, some activities and to get his mail. Patient remains independent of ADL's. His appetite remains fair. No other concerns noted.  3. PATIENT/CAREGIVER EDUCATION/ COPING:  Patient is alert and oriented x3. His affect is flat but he is cordial and will engage. He is content being in his room. His nephew and wife are supportive. Patient is a long-time resident of the facility and is well-known to the staff. No coping issues.  4. PERSONAL EMERGENCY PLAN:  Per facility protocol.  5. COMMUNITY RESOURCES COORDINATION/ HEALTH CARE NAVIGATION:  Patient attends some activities; staff is supportive. 6. FINANCIAL/LEGAL CONCERNS/INTERVENTIONS:  No financial or legal concerns noted.      SOCIAL HX:  Social History   Tobacco Use   Smoking status: Former Smoker   Smokeless  tobacco: Never Used   Tobacco comment: "quit smoking in my 20's"  Substance Use Topics   Alcohol use: Yes    Comment: "stopped drinking in the 1970's"    CODE STATUS: DNR ADVANCED DIRECTIVES: No MOST FORM COMPLETE:  No HOSPICE EDUCATION PROVIDED: No  PPS: Patient ambulates independently, but slow and unsteady gait. He is alert and oriented x3.  Duration of visit and documentation:45        Katheren Puller, LCSW

## 2019-10-05 ENCOUNTER — Non-Acute Institutional Stay: Payer: Medicare Other

## 2019-10-05 ENCOUNTER — Other Ambulatory Visit: Payer: Self-pay

## 2019-10-05 DIAGNOSIS — Z515 Encounter for palliative care: Secondary | ICD-10-CM

## 2019-10-12 NOTE — Progress Notes (Signed)
COMMUNITY PALLIATIVE CARE SW NOTE  PATIENT NAME: [name] [name] DOB: [birth year] MRN: [id number]  PRIMARY CARE PROVIDER: Antony Contras, MD  RESPONSIBLE PARTY:  Acct ID - Guarantor Home Phone Work Phone Relationship Acct Type  [id number] RANSON, [phone]  Self P/F     [address], [place], [place] [address]     PLAN OF CARE and INTERVENTIONS:             1. GOALS OF CARE/ ADVANCE CARE PLANNING:  Goal is for patient to remain at the facility, as independent as possible. Patient is a DNR. 2. SOCIAL/EMOTIONAL/SPIRITUAL ASSESSMENT/ INTERVENTIONS:  SW completed a face-to-face visit with patient at the facility ([name] [name]). Patient was present in his room. He was alert and engaged more than previous visits. He talked to SW about his family and showed pictures of light houses, which is a passion of his. Patient denied pain. He has some small bruising on his arms. He contribute that to having thin skin and hitting the walls or chairs. He continues to appear thin and frail in appearance. SW consulted with the med tech and no concerns noted. Patient remains independent for all ADL's. His appetite remains fair. SW provided supportive presence, social engagement and stimulation, observation, assessment of needs and comfort of patient and consult with staff. SW provide ongoing psychosocial assessment of patient needs and provide support.  3. PATIENT/CAREGIVER EDUCATION/ COPING: Patient is alert and oriented x3. His affect is flat but he is cordial and will engage. He is content being in his room. His nephew and wife are supportive. Patient is a long-time resident of the facility and is well-known to the staff. No coping issues.  4. PERSONAL EMERGENCY PLAN:  Per facility protocol.  5. COMMUNITY RESOURCES COORDINATION/ HEALTH CARE NAVIGATION:  Patient attends some activities; staff is supportive 6. FINANCIAL/LEGAL CONCERNS/INTERVENTIONS:  None     SOCIAL HX:  Social History   Tobacco  Use  . Smoking status: Former Research scientist (life sciences)  . Smokeless tobacco: Never Used  . Tobacco comment: "quit smoking in my 20's"  Substance Use Topics  . Alcohol use: Yes    Comment: "stopped drinking in the 1970's"    CODE STATUS: DNR ADVANCED DIRECTIVES: No MOST FORM COMPLETE:  No HOSPICE EDUCATION PROVIDED: No  PPS: Patient ambulates independently, but slow and unsteady gait. He is alert and oriented x3.  Duration of visit and documentation:45       [name] Puller, LCSW

## 2019-11-25 ENCOUNTER — Non-Acute Institutional Stay: Payer: Medicare Other | Admitting: *Deleted

## 2019-11-25 ENCOUNTER — Other Ambulatory Visit: Payer: Self-pay

## 2019-11-25 VITALS — BP 105/62 | HR 74 | Temp 98.8°F | Resp 19

## 2019-11-25 DIAGNOSIS — Z515 Encounter for palliative care: Secondary | ICD-10-CM

## 2019-11-25 NOTE — Progress Notes (Signed)
COMMUNITY PALLIATIVE CARE RN NOTE  PATIENT NAME: [name] [name] DOB: 02/25/1929 MRN: [id number]  PRIMARY CARE PROVIDER: Antony Contras, MD  RESPONSIBLE PARTY:  Acct ID - Guarantor Home Phone Work Phone Relationship Acct Type  [id number] ELBRIDGE, [phone]  Self P/F     [address], [place], [place] [address]   Covid-19 Pre-screening Negative  PLAN OF CARE and INTERVENTION:  1. ADVANCE CARE PLANNING/GOALS OF CARE: Goal is for patient to remain at current AL facility, [facility].  2. PATIENT/CAREGIVER EDUCATION: Symptom management, safe mobility/transfers 3. DISEASE STATUS: Met with patient in his room at his AL apartment. Upon arrival, he is sitting up on the side of his bed awake and alert. He is able to engage in appropriate conversation. He denies pain at this time. He denies issues with shortness of breath. Respirations are even, regular and unlabored. He is ambulatory using a cane. He is able to perform ADLs independently. He feels that his intake is good and denies any issues with swallowing. He takes his medications whole with water without difficulties. He walks down to the dining hall for all meals and will occasionally go outside to walk or sit on the porch. He says that today he feels "pretty good." He does reports feeling tired most of the time, especially after activities. He wishes that he had more energy. He is sleeping well during the night and takes naps during the day. He says he feels well rested when he awakens. He is continent of both bowel and bladder. He denies any current issues or concerns, other than feelings of fatigue. Will continue to monitor.   HISTORY OF PRESENT ILLNESS: This is a 86 yo male with a h/o HTN, CAD, GERD, DM II, BPH and hyperlipidemia. Palliative care team continues to follow patient and visits monthly and PRN.  CODE STATUS: Full code  ADVANCED DIRECTIVES: N MOST FORM: no PPS: 50%   PHYSICAL EXAM:   VITALS: Today's Vitals    11/25/19 1539  BP: 105/62  Pulse: 74  Resp: 19  Temp: 98.8 F (37.1 C)  TempSrc: Temporal  SpO2: 98%  PainSc: 0-No pain    LUNGS: clear to auscultation  CARDIAC: Cor RRR EXTREMITIES: No edema SKIN: Exposed skin is dry and intact  NEURO: Alert and oriented x 4, generalized weakness, ambulatory w/cane   (Duration of visit and documentation 45 minutes)   Daryl Eastern, RN BSN

## 2019-12-06 ENCOUNTER — Non-Acute Institutional Stay: Payer: Medicare Other

## 2019-12-06 ENCOUNTER — Other Ambulatory Visit: Payer: Self-pay

## 2019-12-06 DIAGNOSIS — Z515 Encounter for palliative care: Secondary | ICD-10-CM

## 2019-12-07 NOTE — Progress Notes (Signed)
COMMUNITY PALLIATIVE CARE SW NOTE  PATIENT NAME: [name] [name] DOB: 08/11/1928 MRN: [id number]  PRIMARY CARE PROVIDER: Antony Contras, MD  RESPONSIBLE PARTY:  Acct ID - Guarantor Home Phone Work Phone Relationship Acct Type  [id number] ANTOINE, [phone]  Self P/F     [address], [place], [place] [address]     PLAN OF CARE and INTERVENTIONS:             1. GOALS OF CARE/ ADVANCE CARE PLANNING:  Goal is for patient to remain at the facility, as independent as possible. Patient is a DNR. 2. SOCIAL/EMOTIONAL/SPIRITUAL ASSESSMENT/ INTERVENTIONS: SW completed a face-to face visit with patient at the facility ([facility]). Patient was present in the dinning room eating lunch. SW sat with him briefly while he ate. He denied pain Patient commented that his lunch was good. He stated that he has not gone to activities, he is spending time in his room. He continues to appear thin and frail in appearance. He remains independent for ADL's. His appetite remains fair. Patient ambulates independently to and from meals. SW provided supportive presence, observation and assessment of needs and comfort. SW will provide ongoing psychosocial assessment of needs and comfort.   3. PATIENT/CAREGIVER EDUCATION/ COPING:  Patient is alert and oriented x3. No coping issues noted. 4. PERSONAL EMERGENCY PLAN:  Per facility protocol.  5. COMMUNITY RESOURCES COORDINATION/ HEALTH CARE NAVIGATION: SW reinforced access to palliative care support.  6. FINANCIAL/LEGAL CONCERNS/INTERVENTIONS: None.     SOCIAL HX:  Social History   Tobacco Use  . Smoking status: Former Research scientist (life sciences)  . Smokeless tobacco: Never Used  . Tobacco comment: "quit smoking in my 20's"  Substance Use Topics  . Alcohol use: Yes    Comment: "stopped drinking in the 1970's"    CODE STATUS: DNR ADVANCED DIRECTIVES: No MOST FORM COMPLETE:  No HOSPICE EDUCATION PROVIDED: No  PPS: Patient ambulates independently, but slow and unsteady  gait. He is alert and oriented x3.  Duration of visit and documentation:45      Katheren Puller, LCSW

## 2020-01-06 ENCOUNTER — Other Ambulatory Visit: Payer: Self-pay

## 2020-01-06 ENCOUNTER — Non-Acute Institutional Stay: Payer: Medicare Other

## 2020-01-06 DIAGNOSIS — Z515 Encounter for palliative care: Secondary | ICD-10-CM

## 2020-01-07 NOTE — Progress Notes (Signed)
COMMUNITY PALLIATIVE CARE SW NOTE  PATIENT NAME: [name] [name] DOB: 02/08/1929 MRN: [id number]  PRIMARY CARE PROVIDER: Antony Contras, MD  RESPONSIBLE PARTY:  Acct ID - Guarantor Home Phone Work Phone Relationship Acct Type  [id number] ERHARD, [phone]  Self P/F     [address], [place], [place] [address]     PLAN OF CARE and INTERVENTIONS:             1. GOALS OF CARE/ ADVANCE CARE PLANNING:  Goal is for patient to remain at the facility, as independent as possible. Patient is a DNR.  2. SOCIAL/EMOTIONAL/SPIRITUAL ASSESSMENT/ INTERVENTIONS:  SW found patient in his room. He was in bed napping, but easily aroused to verbal prompts. Patient denied pain, but stated that he was tired. Patient continued to stay in bed throughout the visit. He is thin and frail in appearance. Patient report that he continue so go down for meals. He ambulates with his walker. He remains independent for ADL's. Patient is napping on and off throughout the day. SW consulted with the facility LPN-Jennifer. She report that patient continue to go down for meals and eats well. There are no recent weights for patient as of yet. However patient has lost over 20 lbs since August of 2020. No other concerns are noted. SW provided observation, assessment of  Patient's needs and comfort, supportive presence and consult with staff. SW will continue to provide assessment of psychosocial needs of patient and provide support.  3. PATIENT/CAREGIVER EDUCATION/ COPING:  Patient is alert and oriented to self and situation. He seems to coping well.  4. PERSONAL EMERGENCY PLAN:  Per facility protocol.  5. COMMUNITY RESOURCES COORDINATION/ HEALTH CARE NAVIGATION:  None. 6. FINANCIAL/LEGAL CONCERNS/INTERVENTIONS:  None.     SOCIAL HX:  Social History   Tobacco Use  . Smoking status: Former Research scientist (life sciences)  . Smokeless tobacco: Never Used  . Tobacco comment: "quit smoking in my 20's"  Substance Use Topics  . Alcohol use: Yes     Comment: "stopped drinking in the 1970's"    CODE STATUS: DNR ADVANCED DIRECTIVES: No MOST FORM COMPLETE: No HOSPICE EDUCATION PROVIDED: No  PPS: Patient ambulates independently with a cane, but slow and unsteady gait. He is alert and oriented x3.  Duration of visit and documentation:45      Katheren Puller, LCSW

## 2020-01-28 ENCOUNTER — Other Ambulatory Visit: Payer: Self-pay

## 2020-01-28 ENCOUNTER — Non-Acute Institutional Stay: Payer: Medicare Other

## 2020-01-28 DIAGNOSIS — Z515 Encounter for palliative care: Secondary | ICD-10-CM

## 2020-02-09 ENCOUNTER — Other Ambulatory Visit: Payer: Self-pay

## 2020-02-09 ENCOUNTER — Emergency Department (HOSPITAL_COMMUNITY)
Admission: EM | Admit: 2020-02-09 | Discharge: 2020-02-09 | Disposition: A | Discharge status: Home / Self Care | Payer: Medicare Other | Attending: Emergency Medicine | Admitting: Emergency Medicine

## 2020-02-09 ENCOUNTER — Encounter (HOSPITAL_COMMUNITY): Payer: Self-pay | Admitting: Emergency Medicine

## 2020-02-09 ENCOUNTER — Emergency Department (HOSPITAL_COMMUNITY): Payer: Medicare Other

## 2020-02-09 DIAGNOSIS — I2510 Atherosclerotic heart disease of native coronary artery without angina pectoris: Secondary | ICD-10-CM | POA: Diagnosis not present

## 2020-02-09 DIAGNOSIS — J45909 Unspecified asthma, uncomplicated: Secondary | ICD-10-CM | POA: Diagnosis not present

## 2020-02-09 DIAGNOSIS — Y92002 Bathroom of unspecified non-institutional (private) residence single-family (private) house as the place of occurrence of the external cause: Secondary | ICD-10-CM | POA: Insufficient documentation

## 2020-02-09 DIAGNOSIS — R58 Hemorrhage, not elsewhere classified: Secondary | ICD-10-CM | POA: Diagnosis not present

## 2020-02-09 DIAGNOSIS — Z87891 Personal history of nicotine dependence: Secondary | ICD-10-CM | POA: Diagnosis not present

## 2020-02-09 DIAGNOSIS — M160 Bilateral primary osteoarthritis of hip: Secondary | ICD-10-CM | POA: Diagnosis not present

## 2020-02-09 DIAGNOSIS — W01118A Fall on same level from slipping, tripping and stumbling with subsequent striking against other sharp object, initial encounter: Secondary | ICD-10-CM | POA: Diagnosis not present

## 2020-02-09 DIAGNOSIS — S0990XA Unspecified injury of head, initial encounter: Secondary | ICD-10-CM | POA: Diagnosis not present

## 2020-02-09 DIAGNOSIS — R52 Pain, unspecified: Secondary | ICD-10-CM | POA: Diagnosis not present

## 2020-02-09 DIAGNOSIS — I6782 Cerebral ischemia: Secondary | ICD-10-CM | POA: Diagnosis not present

## 2020-02-09 DIAGNOSIS — I739 Peripheral vascular disease, unspecified: Secondary | ICD-10-CM | POA: Diagnosis not present

## 2020-02-09 DIAGNOSIS — W19XXXA Unspecified fall, initial encounter: Secondary | ICD-10-CM | POA: Diagnosis not present

## 2020-02-09 DIAGNOSIS — G319 Degenerative disease of nervous system, unspecified: Secondary | ICD-10-CM | POA: Diagnosis not present

## 2020-02-09 DIAGNOSIS — Z23 Encounter for immunization: Secondary | ICD-10-CM | POA: Diagnosis not present

## 2020-02-09 DIAGNOSIS — I110 Hypertensive heart disease with heart failure: Secondary | ICD-10-CM | POA: Insufficient documentation

## 2020-02-09 DIAGNOSIS — E119 Type 2 diabetes mellitus without complications: Secondary | ICD-10-CM | POA: Insufficient documentation

## 2020-02-09 DIAGNOSIS — S0001XA Abrasion of scalp, initial encounter: Secondary | ICD-10-CM

## 2020-02-09 DIAGNOSIS — S199XXA Unspecified injury of neck, initial encounter: Secondary | ICD-10-CM | POA: Diagnosis not present

## 2020-02-09 DIAGNOSIS — I509 Heart failure, unspecified: Secondary | ICD-10-CM | POA: Diagnosis not present

## 2020-02-09 DIAGNOSIS — Z7401 Bed confinement status: Secondary | ICD-10-CM | POA: Diagnosis not present

## 2020-02-09 DIAGNOSIS — F29 Unspecified psychosis not due to a substance or known physiological condition: Secondary | ICD-10-CM | POA: Diagnosis not present

## 2020-02-09 DIAGNOSIS — M2550 Pain in unspecified joint: Secondary | ICD-10-CM | POA: Diagnosis not present

## 2020-02-09 DIAGNOSIS — R102 Pelvic and perineal pain: Secondary | ICD-10-CM | POA: Diagnosis not present

## 2020-02-09 DIAGNOSIS — R5381 Other malaise: Secondary | ICD-10-CM | POA: Diagnosis not present

## 2020-02-09 LAB — I-STAT CHEM 8, ED
BUN: 16 mg/dL (ref 8–23)
Calcium, Ion: 1.1 mmol/L — ABNORMAL LOW (ref 1.15–1.40)
Chloride: 106 mmol/L (ref 98–111)
Creatinine, Ser: 0.6 mg/dL — ABNORMAL LOW (ref 0.61–1.24)
Glucose, Bld: 88 mg/dL (ref 70–99)
HCT: 37 % — ABNORMAL LOW (ref 39.0–52.0)
Hemoglobin: 12.6 g/dL — ABNORMAL LOW (ref 13.0–17.0)
Potassium: 4.1 mmol/L (ref 3.5–5.1)
Sodium: 141 mmol/L (ref 135–145)
TCO2: 24 mmol/L (ref 22–32)

## 2020-02-09 MED ORDER — TETANUS-DIPHTH-ACELL PERTUSSIS 5-2.5-18.5 LF-MCG/0.5 IM SUSY
0.5000 mL | PREFILLED_SYRINGE | Freq: Once | INTRAMUSCULAR | Status: AC
  Administered 2020-02-09: 0.5 mL via INTRAMUSCULAR
  Filled 2020-02-09: qty 0.5

## 2020-02-09 NOTE — ED Triage Notes (Signed)
Patient arrived with EMS from [facility] tripped/fell and hit his head against the corner of a dresser this evening , no LOC /ambulatory with assistance , presents with skin tear at left upper forehead with minimal bleeding . Level 2 activation , he is taking Plavix.

## 2020-02-09 NOTE — ED Provider Notes (Signed)
[facility] EMERGENCY DEPARTMENT Provider Note   CSN: [id number] Arrival date & time: 02/09/20  0041     History Chief Complaint  Patient presents with  . Fall Level2    Plavix    ARZELL MCGEEHAN is a 86 y.o. male.  HPI     This is an 86 year old male with a history of heart failure, coronary artery disease, diabetes on Plavix who presents following a fall.  Patient reports he got out of bed this morning to go to the bathroom.  He lost his balance and fell.  He reports hitting his head on a piece of furniture.  He denies loss of consciousness.  He has been ambulatory.  Denies pain elsewhere.  Unknown last tetanus shot.  Denies nausea, vomiting.  Past Medical History:  Diagnosis Date  . Anxiety   . Asthma    "all my life"  . BPH (benign prostatic hyperplasia)    "even now; after my OR" (12/28/2013)  . CHF (congestive heart failure) ([place])   . Chronic bronchitis ([place])    "often"  . Coronary artery disease   . Depression   . Diabetes mellitus without complication ([place])    Type II  . Diverticulosis of colon (without mention of hemorrhage)   . GERD (gastroesophageal reflux disease)   . Hiatal hernia   . High cholesterol   . Hypertension   . Kidney stone    "I've had 7 over the years" (12/28/2013)  . Pneumonia 1930's  . Rhinitis     Patient Active Problem List   Diagnosis Date Noted  . S/P PTCA (percutaneous transluminal coronary angioplasty) 12/28/2013  . CAD (coronary artery disease), native coronary artery 12/26/2013  . Postsurgical percutaneous transluminal coronary angioplasty (PTCA) status 11/25/2013  . ACUTE ATOPIC CONJUNCTIVITIS 01/08/2007  . ACUTE BRONCHITIS 01/08/2007  . INSOMNIA-SLEEP DISORDER-UNSPEC 12/19/2006  . ANXIETY 12/10/2006  . DEPRESSION 12/10/2006  . TINNITUS, CHRONIC 12/10/2006  . ALLERGIC RHINITIS 12/10/2006  . BENIGN PROSTATIC HYPERTROPHY 12/10/2006  . WEIGHT LOSS 12/10/2006  . GLUCOSE INTOLERANCE, MINIMAL, HX OF  12/10/2006  . NEPHROLITHIASIS, HX OF 12/10/2006  . RHINITIS, ALLERGIC NEC 12/09/2006  . Unspecified asthma(493.90) 12/09/2006  . COLONIC POLYPS 11/24/2006  . DIABETES MELLITUS, TYPE II 11/24/2006  . HYPERLIPIDEMIA 11/24/2006  . HYPERTENSION 11/24/2006  . GERD 11/24/2006  . Diverticulosis of colon (without mention of hemorrhage) 11/24/2006  . CALCULUS, KIDNEY 11/24/2006  . BENIGN PROSTATIC HYPERTROPHY, HX OF 11/24/2006    Past Surgical History:  Procedure Laterality Date  . COLONOSCOPY W/ BIOPSIES AND POLYPECTOMY    . CORONARY ANGIOPLASTY WITH STENT PLACEMENT  11/25/2013; 12/28/2013   "1; ?2"  . CYSTOSCOPY W/ LITHOLAPAXY / E[facility]  X 2  . INGUINAL HERNIA REPAIR Left 1962  . INGUINAL HERNIA REPAIR Right 1980  . LEFT HEART CATHETERIZATION WITH CORONARY ANGIOGRAM N/A 11/25/2013   Procedure: LEFT HEART CATHETERIZATION WITH CORONARY ANGIOGRAM;  Surgeon: Laverda Page, MD;  Location: [facility] CATH LAB;  Service: Cardiovascular;  Laterality: N/A;  . PERCUTANEOUS CORONARY STENT INTERVENTION (PCI-S)  11/25/2013   Procedure: PERCUTANEOUS CORONARY STENT INTERVENTION (PCI-S);  Surgeon: Laverda Page, MD;  Location: [facility] CATH LAB;  Service: Cardiovascular;;  . PERCUTANEOUS CORONARY STENT INTERVENTION (PCI-S) N/A 12/28/2013   Procedure: PERCUTANEOUS CORONARY STENT INTERVENTION (PCI-S);  Surgeon: Laverda Page, MD;  Location: [facility] CATH LAB;  Service: Cardiovascular;  Laterality: N/A;  . PROSTATE SURGERY    . TONSILLECTOMY  1937  . TRANSURETHRAL RESECTION OF PROSTATE  ~ 2007  Family History  Problem Relation Age of Onset  . Heart disease Mother   . Stroke Mother   . Heart disease Father   . Diabetes Brother     Social History   Tobacco Use  . Smoking status: Former Research scientist (life sciences)  . Smokeless tobacco: Never Used  . Tobacco comment: "quit smoking in my 20's"  Vaping Use  . Vaping Use: Never used  Substance Use Topics  . Alcohol use: Yes    Comment: "stopped drinking in the 1970's"  . Drug  use: No    Home Medications Prior to Admission medications   Medication Sig Start Date End Date Taking? Authorizing Provider  clopidogrel (PLAVIX) 75 MG tablet Take 1 tablet (75 mg total) by mouth daily with breakfast. 11/26/13  Yes Adrian Prows, MD  docusate sodium (COLACE) 100 MG capsule Take 1 capsule (100 mg total) by mouth daily. 10/15/17  Yes Caccavale, Sophia, PA-C  OVER THE COUNTER MEDICATION Take 1 Dose by mouth in the morning and at bedtime. House supplement---magic cup   Yes [provider]  polyethylene glycol powder (GLYCOLAX/MIRALAX) 17 GM/SCOOP powder Take 17 g by mouth daily as needed for mild constipation.   Yes [provider]  tamsulosin (FLOMAX) 0.4 MG CAPS capsule Take 1 capsule (0.4 mg total) by mouth daily. 11/17/17  Yes Mesner, Corene Cornea, MD  docusate sodium (COLACE) 100 MG capsule Take 100 mg by mouth daily as needed for mild constipation.    [provider]    Allergies    Lipitor [atorvastatin calcium]  Review of Systems   Review of Systems  Constitutional: Negative for fever.  Respiratory: Negative for shortness of breath.   Cardiovascular: Negative for chest pain.  Gastrointestinal: Negative for abdominal pain, nausea and vomiting.  Skin: Positive for wound.  Neurological: Negative for syncope and headaches.  All other systems reviewed and are negative.   Physical Exam Updated Vital Signs BP 132/72   Pulse 62   Temp 97.7 F (36.5 C) (Oral)   Resp 12   Ht 1.778 m (5\' 10" )   Wt 82 kg   SpO2 98%   BMI 25.94 kg/m   Physical Exam Vitals and nursing note reviewed.  Constitutional:      Appearance: He is well-developed and well-nourished.  HENT:     Head: Normocephalic.     Comments: Abrasion with small skin tear over the left forehead, bleeding controlled Midface stable    Nose: Nose normal.     Mouth/Throat:     Mouth: Mucous membranes are moist.  Eyes:     Extraocular Movements: Extraocular movements intact.     Pupils:  Pupils are equal, round, and reactive to light.  Neck:     Comments: No midline C-spine tenderness palpation, step-off, deformity Cardiovascular:     Rate and Rhythm: Normal rate and regular rhythm.     Heart sounds: Normal heart sounds. No murmur heard.   Pulmonary:     Effort: Pulmonary effort is normal. No respiratory distress.     Breath sounds: Normal breath sounds. No wheezing.  Abdominal:     General: Bowel sounds are normal.     Palpations: Abdomen is soft.     Tenderness: There is no abdominal tenderness. There is no rebound.  Musculoskeletal:        General: No edema.     Cervical back: Neck supple.     Comments: Normal range of motion bilateral hips and knees, no tenderness to palpation or deformities noted  Lymphadenopathy:  Cervical: No cervical adenopathy.  Skin:    General: Skin is warm and dry.  Neurological:     Mental Status: He is alert and oriented to person, place, and time.  Psychiatric:        Mood and Affect: Mood and affect and mood normal.     ED Results / Procedures / Treatments   Labs (all labs ordered are listed, but only abnormal results are displayed) Labs Reviewed  I-STAT CHEM 8, ED - Abnormal; Notable for the following components:      Result Value   Creatinine, Ser 0.60 (*)    Calcium, Ion 1.10 (*)    Hemoglobin 12.6 (*)    HCT 37.0 (*)    All other components within normal limits    EKG None  Radiology CT Head Wo Contrast  Result Date: 02/09/2020 CLINICAL DATA:  Fall, hit head EXAM: CT HEAD WITHOUT CONTRAST TECHNIQUE: Contiguous axial images were obtained from the base of the skull through the vertex without intravenous contrast. COMPARISON:  09/19/2006 FINDINGS: Brain: There is atrophy and chronic small vessel disease changes. No acute intracranial abnormality. Specifically, no hemorrhage, hydrocephalus, mass lesion, acute infarction, or significant intracranial injury. Vascular: No hyperdense vessel or unexpected calcification.  Skull: No acute calvarial abnormality. Sinuses/Orbits: No acute findings Other: None IMPRESSION: Atrophy, chronic microvascular disease. No acute intracranial abnormality. Electronically Signed   By: Rolm Baptise M.D.   On: 02/09/2020 01:43   CT Cervical Spine Wo Contrast  Result Date: 02/09/2020 CLINICAL DATA:  Fall, hit head EXAM: CT CERVICAL SPINE WITHOUT CONTRAST TECHNIQUE: Multidetector CT imaging of the cervical spine was performed without intravenous contrast. Multiplanar CT image reconstructions were also generated. COMPARISON:  None. FINDINGS: Alignment: Normal Skull base and vertebrae: No acute fracture. No primary bone lesion or focal pathologic process. Soft tissues and spinal canal: No prevertebral fluid or swelling. No visible canal hematoma. Disc levels: Advanced diffuse degenerative disc disease and facet disease. Upper chest: No acute findings Other: None IMPRESSION: Advanced diffuse degenerative disc and facet disease. No acute bony abnormality. Electronically Signed   By: Rolm Baptise M.D.   On: 02/09/2020 01:44   DG Pelvis Portable  Result Date: 02/09/2020 CLINICAL DATA:  Fall, pain EXAM: PORTABLE PELVIS 1-2 VIEWS COMPARISON:  None. FINDINGS: Symmetric degenerative changes in the hips. Joint space narrowing and spurring. No acute bony abnormality. Specifically, no fracture, subluxation, or dislocation. IMPRESSION: No acute bony abnormality. Electronically Signed   By: Rolm Baptise M.D.   On: 02/09/2020 02:57    Procedures Procedures (including critical care time)  Medications Ordered in ED Medications  Tdap (BOOSTRIX) injection 0.5 mL (0.5 mLs Intramuscular Given 02/09/20 0149)    ED Course  I have reviewed the triage vital signs and the nursing notes.  Pertinent labs & imaging results that were available during my care of the patient were reviewed by me and considered in my medical decision making (see chart for details).  Clinical Course as of 02/09/20 0318  Wed Feb 09, 2020  0228 Patient ambulated in the hall without difficulty.  He is complaining of some right groin discomfort.  Will obtain pelvis to rule out occult pelvic fracture. [CH]    Clinical Course User Index [CH] Neko Boyajian, Barbette Hair, MD   MDM Rules/Calculators/A&P                           Patient presents following a fall. He is overall nontoxic and vital signs  are reassuring. He has an abrasion with small skin tear of the left forehead. He is on Plavix. He is awake, alert, oriented. No other obvious traumatic injury. CT head neck obtained to rule out intracranial bleed. Do not feel his abrasion requires any suturing. It was cleaned and dressed. Tetanus was updated. CT scans reviewed by myself and showed no evidence of intracranial hemorrhage or cervical spine fracture. Patient did ambulate with a walker with minimal assist. He reports some mild right groin pain which he states he gets sometimes. However, given fall, will obtain x-rays to rule out occult fracture. X-rays reviewed by myself and no obvious pelvic fracture noted. Will discharge back to facility  After history, exam, and medical workup I feel the patient has been appropriately medically screened and is safe for discharge home. Pertinent diagnoses were discussed with the patient. Patient was given return precautions.   Final Clinical Impression(s) / ED Diagnoses Final diagnoses:  Abrasion of scalp, initial encounter    Rx / DC Orders ED Discharge Orders    None       Elika Godar, Barbette Hair, MD 02/09/20 0330

## 2020-02-09 NOTE — ED Notes (Signed)
[facility] notified by secretary for transport back to assisted living . Report given to assisted living staff.

## 2020-02-09 NOTE — Discharge Instructions (Signed)
You were seen today after a fall.  Your CT scans are negative.  He has abrasion to head.  Apply antibiotic ointment.

## 2020-02-09 NOTE — ED Notes (Signed)
Called [facility] for patient to go to [facility].

## 2020-02-11 DIAGNOSIS — S0191XD Laceration without foreign body of unspecified part of head, subsequent encounter: Secondary | ICD-10-CM | POA: Diagnosis not present

## 2020-02-11 DIAGNOSIS — I700 Atherosclerosis of aorta: Secondary | ICD-10-CM | POA: Diagnosis not present

## 2020-02-11 DIAGNOSIS — E441 Mild protein-calorie malnutrition: Secondary | ICD-10-CM | POA: Diagnosis not present

## 2020-02-18 NOTE — Progress Notes (Signed)
COMMUNITY PALLIATIVE CARE SW NOTE  PATIENT NAME: [name] [name] DOB: [birth year] MRN: [id number]  PRIMARY CARE PROVIDER: Antony Contras, MD  RESPONSIBLE PARTY:  Acct ID - Guarantor Home Phone Work Phone Relationship Acct Type  [id number] TRU, [phone]  Self P/F     [address], [place], [place] [address]     PLAN OF CARE and INTERVENTIONS:             1. GOALS OF CARE/ ADVANCE CARE PLANNING:  Goal is for patient to remain at the facility, as independent as possible. Patient is a DNR. 1.  2. SOCIAL/EMOTIONAL/SPIRITUAL ASSESSMENT/ INTERVENTIONS:  SW completed a visit with patient at the facility. Patient was leaving his room to go downstairs to get his mail when SW arrived. Patient appeared to be alert and oriented to self and situation. SW walked with him. He denied that he was having any pain or discomfort, Patient appears to be shuffling and his gait appears to be unsteady despite use of a cane. Patient is thin and frail in appearance. His clothes are loose. Patient remains independent for ADL's.  SW consulted with the staff-Bonnie who reported no concerns or changes with patient. Patient remains open to palliative care support and visits.  1. PATIENT/CAREGIVER EDUCATION/ COPING:  Patient is alert and oriented to self and situation. He seems to coping well. 3.  4. PERSONAL EMERGENCY PLAN:  Per facility protocol. 5. COMMUNITY RESOURCES COORDINATION/ HEALTH CARE NAVIGATION:  None. 6. FINANCIAL/LEGAL CONCERNS/INTERVENTIONS:  None.     SOCIAL HX:  Social History   Tobacco Use  . Smoking status: Former Research scientist (life sciences)  . Smokeless tobacco: Never Used  . Tobacco comment: "quit smoking in my 20's"  Substance Use Topics  . Alcohol use: Yes    Comment: "stopped drinking in the 1970's"    CODE STATUS: DNR ADVANCED DIRECTIVES: No MOST FORM COMPLETE:  No HOSPICE EDUCATION PROVIDED: No  PPS: Patient ambulates independently with a cane, but slow and unsteady gait. He is alert and  oriented x3.  Duration of visit and documentation:45      Katheren Puller, LCSW

## 2020-03-27 ENCOUNTER — Other Ambulatory Visit: Payer: Self-pay

## 2020-03-27 ENCOUNTER — Non-Acute Institutional Stay: Payer: Medicare Other

## 2020-03-27 DIAGNOSIS — Z515 Encounter for palliative care: Secondary | ICD-10-CM

## 2020-03-27 NOTE — Progress Notes (Signed)
COMMUNITY PALLIATIVE CARE SW NOTE  PATIENT NAME: [name] [name] DOB: 05-25-1928 MRN: [id number]  PRIMARY CARE PROVIDER: Antony Contras, MD  RESPONSIBLE PARTY:  Acct ID - Guarantor Home Phone Work Phone Relationship Acct Type  [id number] [name] [name], [phone]  Self P/F     [address], [place], [place] [address]     PLAN OF CARE and INTERVENTIONS:             1. GOALS OF CARE/ ADVANCE CARE PLANNING: Goal is for patient to remain at the facility, as independent as possible. Patient is a DNR. 1.  1. SOCIAL/EMOTIONAL/SPIRITUAL ASSESSMENT/ INTERVENTIONS:  SW completed a face-to-face visit with patient at the facility. Patient was in bed sleeping when SW arrived. He was napping, but easily aroused to his name. He denied pain. Patient stated that he has eaten breakfast and lunch in the dinning room. He appeared sleepy and his affect was flat. He appears to be thin and frail as his clothes were loose and baggy on him. He face and other extremities appear thinner. He remains independent for personal care and is continent of bowel and bladder. Patient had an emergency room visit on 12/15 due to a fall. He hit his head but did not sustain any injuries or cognitive changes. SW provided observation, assessment of  Patient's needs and comfort, supportive presence and consult with staff. SW will continue to provide assessment of psychosocial needs of patient and provide support. 2.  1. PATIENT/CAREGIVER EDUCATION/ COPING:  Patient is alert and oriented to self and situation. He seems to coping well. 3.  4. PERSONAL EMERGENCY PLAN:  Per facility protocol.  5. COMMUNITY RESOURCES COORDINATION/ HEALTH CARE NAVIGATION:  None. 6. FINANCIAL/LEGAL CONCERNS/INTERVENTIONS:  None.      SOCIAL HX:  Social History   Tobacco Use  . Smoking status: Former Research scientist (life sciences)  . Smokeless tobacco: Never Used  . Tobacco comment: "quit smoking in my 20's"  Substance Use Topics  . Alcohol use: Yes    Comment: "stopped  drinking in the 1970's"    CODE STATUS: DNR ADVANCED DIRECTIVES: No MOST FORM COMPLETE: No HOSPICE EDUCATION PROVIDED: No  PPS: Patient ambulates independentlywith a cane, but slow and unsteady gait. He is alert and oriented x3.  Duration of visit and documentation:45 minutes    Katheren Puller, LCSW

## 2020-04-11 ENCOUNTER — Other Ambulatory Visit: Payer: Self-pay

## 2020-04-11 ENCOUNTER — Non-Acute Institutional Stay: Payer: Medicare Other

## 2020-04-11 DIAGNOSIS — Z515 Encounter for palliative care: Secondary | ICD-10-CM

## 2020-04-11 NOTE — Progress Notes (Signed)
COMMUNITY PALLIATIVE CARE SW NOTE  PATIENT NAME: [name] [name] DOB: Jan 23, 1929 MRN: [id number]  PRIMARY CARE PROVIDER: Antony Contras, MD  RESPONSIBLE PARTY:  Acct ID - Guarantor Home Phone Work Phone Relationship Acct Type  [id number] [name], [name]  Self P/F     [address], [place], [place] [address]     PLAN OF CARE and INTERVENTIONS:             1. GOALS OF CARE/ ADVANCE CARE PLANNING:   1. Goal is for patient to remain at the facility, as independent as possible. Patient is a DNR. 1. SOCIAL/EMOTIONAL/SPIRITUAL ASSESSMENT/ INTERVENTIONS:  SW visited with patient at the face-to-face visit with patient at the facility [facility]). Patient was in the bathroom when SW arrived. He came out to talk to SW. He denied pain. He remains thin and pale in appearance. He was responsive to simple yes/no questions. 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 However, he was ready to take a nap and informed SW that he was tired. SW consulted with the facility nurse who reported that patient has been doing well. He continues to come down for meals. He has not had any other falls. He uses a cane to ambulate. His gait remains unsteady. SW provided observation, assessment of patient's needs and comfort, supportive presence and consult with staff. SW will continue to provide assessment of psychosocial needs of patient and provide support.  2. PATIENT/CAREGIVER EDUCATION/ COPING:  Patient is alert and oriented to self and situation. He seems to coping well. 2. PERSONAL EMERGENCY PLAN:  Per facility protocol.  3. COMMUNITY RESOURCES COORDINATION/ HEALTH CARE NAVIGATION:  None. 4. FINANCIAL/LEGAL CONCERNS/INTERVENTIONS:  None     SOCIAL HX:  Social History   Tobacco Use  . Smoking status: Former Research scientist (life sciences)  . Smokeless tobacco: Never Used  . Tobacco comment: "quit smoking in my 20's"  Substance Use Topics  . Alcohol use: Yes    Comment: "stopped drinking in the 1970's"    CODE  STATUS: DNR ADVANCED DIRECTIVES: No MOST FORM COMPLETE: No HOSPICE EDUCATION PROVIDED: No  GYF:VCBSWHQ ambulates independentlywith a cane, but slow and unsteady gait. He is alert and oriented x3.  Duration of visit and documentation:45 minutes        Katheren Puller, LCSW

## 2020-05-17 ENCOUNTER — Non-Acute Institutional Stay: Payer: Medicare Other

## 2020-05-17 ENCOUNTER — Other Ambulatory Visit: Payer: Self-pay

## 2020-05-17 DIAGNOSIS — Z515 Encounter for palliative care: Secondary | ICD-10-CM

## 2020-05-17 NOTE — Progress Notes (Signed)
COMMUNITY PALLIATIVE CARE SW NOTE  PATIENT NAME: [name] [name] DOB: [birth year] MRN: [id number]  PRIMARY CARE PROVIDER: Antony Contras, MD  RESPONSIBLE PARTY:  Acct ID - Guarantor Home Phone Work Phone Relationship Acct Type  [id number] DRAVYN, [phone]  Self P/F     [address], [place], [place] [address]     PLAN OF CARE and INTERVENTIONS:             1. GOALS OF CARE/ ADVANCE CARE PLANNING: Patient is a DNR. The goal is for patient to remain at the facility as safe and independent as possible.  2. SOCIAL/EMOTIONAL/SPIRITUAL ASSESSMENT/ INTERVENTIONS:  SW completed a visit with patient at [facility]. Patient was in his room, lying in bed. He had his eyes closed, but easily aroused. He denied that he was having any pain. He stated that he went down for breakfast and was able to tell SW what he had (cereal, fruit and toast). Patient reported that he was getting along well, he denied any falls, discomfort or issues with his overall health or mental status. He was more talkative during this visit than previous visits. He talked about his siblings, the ones that are deceased and the ones that are living. He also shared passed relationships he had that impacted the reasons he did not marry or have children. Patient remains thin in appearance, but is neatly dressed. He report that his appetite remains good, eating three meals a day. He utilizes a can to ambulate or hold on to the walls or furniture. His gait is unsteady. SW assessment of needs and comfort, observation, supportive presence, open engagement. SW attempted to call patient's nephew to provide an update and support to him, but did not receive an answer. SW will continue to provide psychosocial assessment and support through routine visit.  3. PATIENT/CAREGIVER EDUCATION/ COPING:  Patient is alert and oriented x3 and seems to be coping well overall. 4. PERSONAL EMERGENCY PLAN:  Per [facility] protocol. 5. COMMUNITY  RESOURCES COORDINATION/ HEALTH CARE NAVIGATION:  None currently. 6. FINANCIAL/LEGAL CONCERNS/INTERVENTIONS:  None.     SOCIAL HX:  Social History   Tobacco Use  . Smoking status: Former Research scientist (life sciences)  . Smokeless tobacco: Never Used  . Tobacco comment: "quit smoking in my 20's"  Substance Use Topics  . Alcohol use: Yes    Comment: "stopped drinking in the 1970's"    CODE STATUS: DNR ADVANCED DIRECTIVES: No MOST FORM COMPLETE: No HOSPICE EDUCATION PROVIDED: No  PPS: Patient ambulates independentlywith a cane, but slow and his gait is unsteady.  He is alert and oriented x3.  Duration of visit and documentation: 48minutes      Katheren Puller, LCSW

## 2020-06-13 ENCOUNTER — Other Ambulatory Visit: Payer: Self-pay

## 2020-06-13 ENCOUNTER — Non-Acute Institutional Stay: Payer: Medicare Other

## 2020-06-13 DIAGNOSIS — Z515 Encounter for palliative care: Secondary | ICD-10-CM

## 2020-06-13 NOTE — Progress Notes (Signed)
COMMUNITY PALLIATIVE CARE SW NOTE  PATIENT NAME: [name] [name] DOB: April 25, 1928 MRN: [id number]  PRIMARY CARE PROVIDER: Antony Contras, MD  RESPONSIBLE PARTY:  Acct ID - Guarantor Home Phone Work Phone Relationship Acct Type  [id number] [name], [name]  Self P/F     [address], [place], [place] [address]     PLAN OF CARE and INTERVENTIONS:             1. GOALS OF CARE/ ADVANCE CARE PLANNING:  Goal is for patient to remain at the facility. Patient is a DNR.  2. SOCIAL/EMOTIONAL/SPIRITUAL ASSESSMENT/ INTERVENTIONS:  SW completed a visit with patient at his apartment at [place]. Patient was present in his room. He was in bed, napping, but easily aroused to verbal prompts. Patient denied pain. Patient did not get up from the bed, but was responsive to simple yes/no questions. Patient denied any falls. He report that his intake remains stable. He is not having any discomfort. He reported that he was not having any new issues. He continues to ambulate downstairs to meals. He utilizes his cane to ambulate downstairs, while holding on to the wall or furniture. Despite this his gait remains unsteady. He is thin and frail in appearance. Patient naps on and off throughout the day. SW provided supportive presence, assessment of needs and comfort of patient, consulted with staff and reported no concerns. SW will continue to provide ongoing visits to provide psychosocial assessment and support.  3. PATIENT/CAREGIVER EDUCATION/ COPING:  Patient is alert and oriented x3. He seems to be coping well.  4. PERSONAL EMERGENCY PLAN:  Per facility protocol. 5. COMMUNITY RESOURCES COORDINATION/ HEALTH CARE NAVIGATION:  SW reinforced access to palliative care support. 6. FINANCIAL/LEGAL CONCERNS/INTERVENTIONS:  None.      SOCIAL HX:  Social History   Tobacco Use  . Smoking status: Former Research scientist (life sciences)  . Smokeless tobacco: Never Used  . Tobacco comment: "quit smoking in my 20's"  Substance Use  Topics  . Alcohol use: Yes    Comment: "stopped drinking in the 1970's"    CODE STATUS: DNR ADVANCED DIRECTIVES: No MOST FORM COMPLETE: No HOSPICE EDUCATION PROVIDED: No  PPS: Patient ambulates independentlywith a cane, but slow and his gait is unsteady.  He is alert and oriented x3.  Duration of visit and documentation: 63minutes    Katheren Puller, LCSW

## 2020-06-28 ENCOUNTER — Non-Acute Institutional Stay: Payer: Medicare Other

## 2020-06-28 ENCOUNTER — Other Ambulatory Visit: Payer: Self-pay

## 2020-06-28 DIAGNOSIS — Z515 Encounter for palliative care: Secondary | ICD-10-CM

## 2020-07-12 NOTE — Progress Notes (Signed)
COMMUNITY PALLIATIVE CARE SW NOTE  PATIENT NAME: [name] [name] DOB: 13-Aug-1928 MRN: [id number]  PRIMARY CARE PROVIDER: Antony Contras, MD  RESPONSIBLE PARTY:  Acct ID - Guarantor Home Phone Work Phone Relationship Acct Type  [id number] [name] [name] [phone]  Self P/F     [address], [place], [place] [address]     PLAN OF CARE and INTERVENTIONS:             1. GOALS OF CARE/ ADVANCE CARE PLANNING:  Goal is for patient to remain in the facility. Patient is a DNR. 2. SOCIAL/EMOTIONAL/SPIRITUAL ASSESSMENT/ INTERVENTIONS:  SW completed a visit with patient at the facility. Patient was in the bathroom when SW arrived. He came out when SW called out for him. He went and sat on the side of the bed. He denied pain. Patient is quiet, and is only engaged, when prompted. He responded to simple yes/no questions. He appears to be thin and frail in appearance. He ambulates independently around his apartment by holding on to the wall and furniture. SW consulted with the facility nurse, who reported no changes in patient's condition. He has not had any falls, he goes down for meals on his own and he does play bingo when it is on the schedule. No other concerns noted. SW provided supportive presence, assessment of needs and comfort of patient, supportive presence and consult with staff. 3. PATIENT/CAREGIVER EDUCATION/ COPING:  Patient appears to be coping well.  4. PERSONAL EMERGENCY PLAN: Per facility protocol.  5. COMMUNITY RESOURCES COORDINATION/ HEALTH CARE NAVIGATION:  SW reinforced access to palliative care support. 6. FINANCIAL/LEGAL CONCERNS/INTERVENTIONS:  None.     SOCIAL HX:  Social History   Tobacco Use  . Smoking status: Former Research scientist (life sciences)  . Smokeless tobacco: Never Used  . Tobacco comment: "quit smoking in my 20's"  Substance Use Topics  . Alcohol use: Yes    Comment: "stopped drinking in the 1970's"    CODE STATUS: DNR ADVANCED DIRECTIVES: No MOST FORM COMPLETE:  No HOSPICE  EDUCATION PROVIDED: No  PPS: Patient ambulates independentlywith a cane, but slow andhis gait isunsteady.He is alert and oriented x3.  Duration of visit and documentation: 29minutes      Katheren Puller, LCSW

## 2020-07-27 ENCOUNTER — Other Ambulatory Visit: Payer: Self-pay

## 2020-07-27 ENCOUNTER — Non-Acute Institutional Stay: Payer: Medicare Other

## 2020-07-27 DIAGNOSIS — Z515 Encounter for palliative care: Secondary | ICD-10-CM

## 2020-08-15 NOTE — Progress Notes (Signed)
COMMUNITY PALLIATIVE CARE SW NOTE  PATIENT NAME: [name] [name] DOB: [birth year] MRN: [id number]  PRIMARY CARE PROVIDER: Antony Contras, MD  RESPONSIBLE PARTY:  Acct ID - Guarantor Home Phone Work Phone Relationship Acct Type  [id number] VITO, BEG* [phone]  Self P/F     [address], [place], [place] [address]     PLAN OF CARE and INTERVENTIONS:             GOALS OF CARE/ ADVANCE CARE PLANNING:  Goal is for patient to remain in the facility. Patient is a DNR. SOCIAL/EMOTIONAL/SPIRITUAL ASSESSMENT/ INTERVENTIONS:  SW completed a visit with patient at the facility. Patient was in his room, sitting on the side of the bed, awake and alert. Patient denied pain. He was quiet until he was prompted by SW with a question. SW was ale to engage patient around his family. Patient talked about growing up in a family during the depression. Patient engaged more than his typical yes/no questions. He continues to ambulate independently, occasionally using his cane. Patient is independent for personal care needs. He remains thin and frail in appearance. His affect is flat, but patient was cordial. SW consulted with the facility nurse-Jennifer who confirmed that patient continues to do well, despite occasional weakness. LPN report that patient naps on and off throughout the day.  PATIENT/CAREGIVER EDUCATION/ COPING:  Patient appears to be coping well.  PERSONAL EMERGENCY PLAN:  Per facility protocol. COMMUNITY RESOURCES COORDINATION/ HEALTH CARE NAVIGATION:  SW reinforced palliative care support. FINANCIAL/LEGAL CONCERNS/INTERVENTIONS:  None.     SOCIAL HX:  Social History   Tobacco Use   Smoking status: Former    Pack years: 0.00   Smokeless tobacco: Never   Tobacco comments:    "quit smoking in my 20's"  Substance Use Topics   Alcohol use: Yes    Comment: "stopped drinking in the 1970's"    CODE STATUS: DNR ADVANCED DIRECTIVES: No MOST FORM COMPLETE: No HOSPICE EDUCATION PROVIDED:  No  PPS: Patient ambulates independently with a cane, but slow and his gait is unsteady.  He is alert and oriented x3.   Duration of visit and documentation: 45 minutes      Katheren Puller, LCSW

## 2020-11-20 ENCOUNTER — Other Ambulatory Visit: Payer: Self-pay

## 2020-11-20 ENCOUNTER — Non-Acute Institutional Stay: Payer: Medicare Other

## 2020-11-20 DIAGNOSIS — Z515 Encounter for palliative care: Secondary | ICD-10-CM
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
## 2020-12-22 ENCOUNTER — Non-Acute Institutional Stay: Payer: Medicare Other

## 2020-12-22 ENCOUNTER — Other Ambulatory Visit: Payer: Self-pay

## 2020-12-22 DIAGNOSIS — Z515 Encounter for palliative care: Secondary | ICD-10-CM

## 2020-12-25 NOTE — Progress Notes (Signed)
COMMUNITY PALLIATIVE CARE SW NOTE  PATIENT NAME: [name] BOWSHER DOB: February 22, 1929 MRN: [id number]  PRIMARY CARE PROVIDER: Antony Contras, MD  RESPONSIBLE PARTY:  Acct ID - Guarantor Home Phone Work Phone Relationship Acct Type  [id number] [name] [name], [name] [name]* [phone]  Self P/F     [address], [place], [place] [address]     PLAN OF CARE and INTERVENTIONS:             GOALS OF CARE/ ADVANCE CARE PLANNING:  Goal is for patient to remain in the facility. Patient is a DNR.  SOCIAL/EMOTIONAL/SPIRITUAL ASSESSMENT/ INTERVENTIONS:  SW completed a check-in visit with patient at the facility. Patient was present in his room, laying I bed, appearing to be napping. He aroused to verbal prompts. Patient denied pain and stated that his arm was doing much better. Patient reported that a he opened a door and it came back on him and hit his arm. SW observed red spots that appear to be old bruising on his arm. He stated that the nurse had a bandage over it and it had healed nicely. Patient continues to appear thin and frail. He continues to ambulate independently and occasionally uses his cane. His affect is flat, but he is engaged when prompted. SW consulted with both facility nurses-Rebecca and Anderson Malta who reported no changes in patient's condition. SW obtained patient's most recent weights: July-122.8 lbs, August-120.2 lbs and September-118.2 lbs. Patient appears to have small steady weight loss, however there has been no change in in intake. Patient and staff remain open to ongoing palliative care support.  PATIENT/CAREGIVER EDUCATION/ COPING:  Patient continues to cope well. He has family and facility support.  PERSONAL EMERGENCY PLAN:  Per facility protocol. COMMUNITY RESOURCES COORDINATION/ HEALTH CARE NAVIGATION:  None. FINANCIAL/LEGAL CONCERNS/INTERVENTIONS:  None.     SOCIAL HX:  Social History   Tobacco Use   Smoking status: Former   Smokeless tobacco: Never   Tobacco comments:    "quit smoking in  my 20's"  Substance Use Topics   Alcohol use: Yes    Comment: "stopped drinking in the 1970's"    CODE STATUS: DNR ADVANCED DIRECTIVES: No MOST FORM COMPLETE:  No HOSPICE EDUCATION PROVIDED: No  PPS: Patient ambulates independently and occasionally uses a cane, but slow and his gait is unsteady.  He is alert and oriented x3.   Duration of visit and documentation: 45 minutes   Katheren Puller, LCSW

## 2021-01-26 DIAGNOSIS — E1169 Type 2 diabetes mellitus with other specified complication: Secondary | ICD-10-CM | POA: Diagnosis not present

## 2021-01-26 DIAGNOSIS — I2510 Atherosclerotic heart disease of native coronary artery without angina pectoris: Secondary | ICD-10-CM | POA: Diagnosis not present

## 2021-01-26 DIAGNOSIS — M79671 Pain in right foot: Secondary | ICD-10-CM | POA: Diagnosis not present

## 2021-01-26 DIAGNOSIS — E46 Unspecified protein-calorie malnutrition: Secondary | ICD-10-CM | POA: Diagnosis not present

## 2021-01-26 DIAGNOSIS — E441 Mild protein-calorie malnutrition: Secondary | ICD-10-CM | POA: Diagnosis not present

## 2021-01-26 DIAGNOSIS — I10 Essential (primary) hypertension: Secondary | ICD-10-CM | POA: Diagnosis not present

## 2021-01-26 DIAGNOSIS — I700 Atherosclerosis of aorta: Secondary | ICD-10-CM | POA: Diagnosis not present

## 2021-01-26 DIAGNOSIS — I509 Heart failure, unspecified: Secondary | ICD-10-CM | POA: Diagnosis not present

## 2021-01-26 DIAGNOSIS — N400 Enlarged prostate without lower urinary tract symptoms: Secondary | ICD-10-CM | POA: Diagnosis not present

## 2021-04-07 ENCOUNTER — Inpatient Hospital Stay (HOSPITAL_COMMUNITY)
Admission: EM | Admit: 2021-04-07 | Discharge: 2021-04-11 | DRG: 394 | Disposition: A | Discharge status: Nursing Home (Includes ICF) | Payer: Medicare Other | Source: Skilled Nursing Facility | Attending: Internal Medicine | Admitting: Internal Medicine

## 2021-04-07 ENCOUNTER — Emergency Department (HOSPITAL_COMMUNITY): Payer: Medicare Other

## 2021-04-07 ENCOUNTER — Encounter (HOSPITAL_COMMUNITY): Payer: Self-pay

## 2021-04-07 DIAGNOSIS — R935 Abnormal findings on diagnostic imaging of other abdominal regions, including retroperitoneum: Secondary | ICD-10-CM

## 2021-04-07 DIAGNOSIS — N400 Enlarged prostate without lower urinary tract symptoms: Secondary | ICD-10-CM | POA: Diagnosis not present

## 2021-04-07 DIAGNOSIS — R64 Cachexia: Secondary | ICD-10-CM | POA: Diagnosis present

## 2021-04-07 DIAGNOSIS — Z79899 Other long term (current) drug therapy: Secondary | ICD-10-CM

## 2021-04-07 DIAGNOSIS — R7989 Other specified abnormal findings of blood chemistry: Secondary | ICD-10-CM | POA: Diagnosis present

## 2021-04-07 DIAGNOSIS — R1030 Lower abdominal pain, unspecified: Secondary | ICD-10-CM | POA: Diagnosis not present

## 2021-04-07 DIAGNOSIS — E860 Dehydration: Secondary | ICD-10-CM | POA: Diagnosis present

## 2021-04-07 DIAGNOSIS — K460 Unspecified abdominal hernia with obstruction, without gangrene: Secondary | ICD-10-CM | POA: Diagnosis not present

## 2021-04-07 DIAGNOSIS — Z955 Presence of coronary angioplasty implant and graft: Secondary | ICD-10-CM

## 2021-04-07 DIAGNOSIS — I10 Essential (primary) hypertension: Secondary | ICD-10-CM | POA: Diagnosis not present

## 2021-04-07 DIAGNOSIS — K56609 Unspecified intestinal obstruction, unspecified as to partial versus complete obstruction: Secondary | ICD-10-CM

## 2021-04-07 DIAGNOSIS — R54 Age-related physical debility: Secondary | ICD-10-CM | POA: Diagnosis present

## 2021-04-07 DIAGNOSIS — Z66 Do not resuscitate: Secondary | ICD-10-CM | POA: Diagnosis present

## 2021-04-07 DIAGNOSIS — K7689 Other specified diseases of liver: Secondary | ICD-10-CM | POA: Diagnosis not present

## 2021-04-07 DIAGNOSIS — Z87891 Personal history of nicotine dependence: Secondary | ICD-10-CM

## 2021-04-07 DIAGNOSIS — K8020 Calculus of gallbladder without cholecystitis without obstruction: Secondary | ICD-10-CM | POA: Diagnosis not present

## 2021-04-07 DIAGNOSIS — I2510 Atherosclerotic heart disease of native coronary artery without angina pectoris: Secondary | ICD-10-CM | POA: Diagnosis present

## 2021-04-07 DIAGNOSIS — D696 Thrombocytopenia, unspecified: Secondary | ICD-10-CM | POA: Diagnosis not present

## 2021-04-07 DIAGNOSIS — K838 Other specified diseases of biliary tract: Secondary | ICD-10-CM | POA: Diagnosis not present

## 2021-04-07 DIAGNOSIS — R001 Bradycardia, unspecified: Secondary | ICD-10-CM | POA: Diagnosis not present

## 2021-04-07 DIAGNOSIS — K5730 Diverticulosis of large intestine without perforation or abscess without bleeding: Secondary | ICD-10-CM | POA: Diagnosis not present

## 2021-04-07 DIAGNOSIS — I959 Hypotension, unspecified: Secondary | ICD-10-CM | POA: Diagnosis not present

## 2021-04-07 DIAGNOSIS — I5022 Chronic systolic (congestive) heart failure: Secondary | ICD-10-CM | POA: Diagnosis present

## 2021-04-07 DIAGNOSIS — R338 Other retention of urine: Secondary | ICD-10-CM | POA: Diagnosis present

## 2021-04-07 DIAGNOSIS — K5900 Constipation, unspecified: Secondary | ICD-10-CM | POA: Diagnosis not present

## 2021-04-07 DIAGNOSIS — E119 Type 2 diabetes mellitus without complications: Secondary | ICD-10-CM | POA: Diagnosis not present

## 2021-04-07 DIAGNOSIS — N19 Unspecified kidney failure: Secondary | ICD-10-CM | POA: Diagnosis not present

## 2021-04-07 DIAGNOSIS — K4031 Unilateral inguinal hernia, with obstruction, without gangrene, recurrent: Secondary | ICD-10-CM | POA: Diagnosis not present

## 2021-04-07 DIAGNOSIS — F419 Anxiety disorder, unspecified: Secondary | ICD-10-CM | POA: Diagnosis present

## 2021-04-07 DIAGNOSIS — K8689 Other specified diseases of pancreas: Secondary | ICD-10-CM | POA: Diagnosis not present

## 2021-04-07 DIAGNOSIS — E7800 Pure hypercholesterolemia, unspecified: Secondary | ICD-10-CM | POA: Diagnosis present

## 2021-04-07 DIAGNOSIS — Z7902 Long term (current) use of antithrombotics/antiplatelets: Secondary | ICD-10-CM

## 2021-04-07 DIAGNOSIS — K4090 Unilateral inguinal hernia, without obstruction or gangrene, not specified as recurrent: Secondary | ICD-10-CM | POA: Diagnosis not present

## 2021-04-07 DIAGNOSIS — Z87442 Personal history of urinary calculi: Secondary | ICD-10-CM

## 2021-04-07 DIAGNOSIS — Z8249 Family history of ischemic heart disease and other diseases of the circulatory system: Secondary | ICD-10-CM

## 2021-04-07 DIAGNOSIS — J45909 Unspecified asthma, uncomplicated: Secondary | ICD-10-CM | POA: Diagnosis present

## 2021-04-07 DIAGNOSIS — E440 Moderate protein-calorie malnutrition: Secondary | ICD-10-CM | POA: Diagnosis present

## 2021-04-07 DIAGNOSIS — N401 Enlarged prostate with lower urinary tract symptoms: Secondary | ICD-10-CM | POA: Diagnosis present

## 2021-04-07 DIAGNOSIS — I110 Hypertensive heart disease with heart failure: Secondary | ICD-10-CM | POA: Diagnosis not present

## 2021-04-07 DIAGNOSIS — Z20822 Contact with and (suspected) exposure to covid-19: Secondary | ICD-10-CM | POA: Diagnosis present

## 2021-04-07 DIAGNOSIS — Z8701 Personal history of pneumonia (recurrent): Secondary | ICD-10-CM

## 2021-04-07 DIAGNOSIS — I350 Nonrheumatic aortic (valve) stenosis: Secondary | ICD-10-CM | POA: Diagnosis not present

## 2021-04-07 DIAGNOSIS — J42 Unspecified chronic bronchitis: Secondary | ICD-10-CM | POA: Diagnosis not present

## 2021-04-07 DIAGNOSIS — Z6825 Body mass index (BMI) 25.0-25.9, adult: Secondary | ICD-10-CM | POA: Diagnosis not present

## 2021-04-07 DIAGNOSIS — Z888 Allergy status to other drugs, medicaments and biological substances status: Secondary | ICD-10-CM

## 2021-04-07 DIAGNOSIS — N200 Calculus of kidney: Secondary | ICD-10-CM | POA: Diagnosis not present

## 2021-04-07 DIAGNOSIS — K219 Gastro-esophageal reflux disease without esophagitis: Secondary | ICD-10-CM | POA: Diagnosis present

## 2021-04-07 DIAGNOSIS — I493 Ventricular premature depolarization: Secondary | ICD-10-CM | POA: Diagnosis present

## 2021-04-07 DIAGNOSIS — I429 Cardiomyopathy, unspecified: Secondary | ICD-10-CM | POA: Diagnosis present

## 2021-04-07 DIAGNOSIS — F32A Depression, unspecified: Secondary | ICD-10-CM | POA: Diagnosis present

## 2021-04-07 DIAGNOSIS — R300 Dysuria: Secondary | ICD-10-CM | POA: Diagnosis not present

## 2021-04-07 DIAGNOSIS — Z01810 Encounter for preprocedural cardiovascular examination: Secondary | ICD-10-CM | POA: Diagnosis not present

## 2021-04-07 DIAGNOSIS — K820 Obstruction of gallbladder: Secondary | ICD-10-CM | POA: Diagnosis not present

## 2021-04-07 DIAGNOSIS — K5669 Other partial intestinal obstruction: Secondary | ICD-10-CM | POA: Diagnosis not present

## 2021-04-07 DIAGNOSIS — Q445 Other congenital malformations of bile ducts: Secondary | ICD-10-CM

## 2021-04-07 HISTORY — DX: Unspecified intestinal obstruction, unspecified as to partial versus complete obstruction: K56.609

## 2021-04-07 LAB — CBC WITH DIFFERENTIAL/PLATELET
Abs Immature Granulocytes: 0.01 10*3/uL (ref 0.00–0.07)
Basophils Absolute: 0 10*3/uL (ref 0.0–0.1)
Basophils Relative: 0 %
Eosinophils Absolute: 0 10*3/uL (ref 0.0–0.5)
Eosinophils Relative: 1 %
HCT: 35.5 % — ABNORMAL LOW (ref 39.0–52.0)
Hemoglobin: 11.8 g/dL — ABNORMAL LOW (ref 13.0–17.0)
Immature Granulocytes: 0 %
Lymphocytes Relative: 18 %
Lymphs Abs: 0.5 10*3/uL — ABNORMAL LOW (ref 0.7–4.0)
MCH: 31.3 pg (ref 26.0–34.0)
MCHC: 33.2 g/dL (ref 30.0–36.0)
MCV: 94.2 fL (ref 80.0–100.0)
Monocytes Absolute: 0.2 10*3/uL (ref 0.1–1.0)
Monocytes Relative: 6 %
Neutro Abs: 2.2 10*3/uL (ref 1.7–7.7)
Neutrophils Relative %: 75 %
Platelets: 127 10*3/uL — ABNORMAL LOW (ref 150–400)
RBC: 3.77 MIL/uL — ABNORMAL LOW (ref 4.22–5.81)
RDW: 15.7 % — ABNORMAL HIGH (ref 11.5–15.5)
WBC: 2.9 10*3/uL — ABNORMAL LOW (ref 4.0–10.5)
nRBC: 0 % (ref 0.0–0.2)

## 2021-04-07 LAB — COMPREHENSIVE METABOLIC PANEL
ALT: 11 U/L (ref 0–44)
AST: 17 U/L (ref 15–41)
Albumin: 4 g/dL (ref 3.5–5.0)
Alkaline Phosphatase: 64 U/L (ref 38–126)
Anion gap: 7 (ref 5–15)
BUN: 18 mg/dL (ref 8–23)
CO2: 30 mmol/L (ref 22–32)
Calcium: 9.1 mg/dL (ref 8.9–10.3)
Chloride: 100 mmol/L (ref 98–111)
Creatinine, Ser: 0.7 mg/dL (ref 0.61–1.24)
GFR, Estimated: >60 mL/min (ref >60)
Glucose, Bld: 143 mg/dL — ABNORMAL HIGH (ref 70–99)
Potassium: 4 mmol/L (ref 3.5–5.1)
Sodium: 137 mmol/L (ref 135–145)
Total Bilirubin: 0.7 mg/dL (ref 0.3–1.2)
Total Protein: 6.8 g/dL (ref 6.5–8.1)

## 2021-04-07 LAB — LIPASE, BLOOD: Lipase: 81 U/L — ABNORMAL HIGH (ref 11–51)

## 2021-04-07 LAB — RESP PANEL BY RT-PCR (FLU A&B, COVID) ARPGX2
Influenza A by PCR: NEGATIVE
Influenza B by PCR: NEGATIVE
SARS Coronavirus 2 by RT PCR: NEGATIVE

## 2021-04-07 MED ORDER — HYDROMORPHONE HCL 1 MG/ML IJ SOLN
0.5000 mg | Freq: Once | INTRAMUSCULAR | Status: AC
  Administered 2021-04-07: 0.5 mg via INTRAVENOUS
  Filled 2021-04-07: qty 1

## 2021-04-07 MED ORDER — FENTANYL CITRATE PF 50 MCG/ML IJ SOSY
25.0000 ug | PREFILLED_SYRINGE | INTRAMUSCULAR | Status: DC | PRN
  Administered 2021-04-07: 25 ug via INTRAVENOUS
  Filled 2021-04-07: qty 1

## 2021-04-07 MED ORDER — ONDANSETRON HCL 4 MG/2ML IJ SOLN: 4.0000 mg | Freq: Four times a day (QID) | INTRAMUSCULAR | Status: DC | PRN

## 2021-04-07 MED ORDER — ONDANSETRON HCL 4 MG/2ML IJ SOLN
4.0000 mg | Freq: Once | INTRAMUSCULAR | Status: AC
  Administered 2021-04-07: 4 mg via INTRAVENOUS
  Filled 2021-04-07: qty 2

## 2021-04-07 MED ORDER — NALOXONE HCL 0.4 MG/ML IJ SOLN: 0.4000 mg | INTRAMUSCULAR | Status: DC | PRN

## 2021-04-07 MED ORDER — MORPHINE SULFATE (PF) 4 MG/ML IV SOLN
4.0000 mg | Freq: Once | INTRAVENOUS | Status: AC
  Administered 2021-04-07: 4 mg via INTRAVENOUS
  Filled 2021-04-07: qty 1

## 2021-04-07 MED ORDER — IOHEXOL 300 MG/ML  SOLN
100.0000 mL | Freq: Once | INTRAMUSCULAR | Status: AC | PRN
  Administered 2021-04-07: 100 mL via INTRAVENOUS

## 2021-04-07 MED ORDER — SODIUM CHLORIDE 0.9 % IV SOLN: Freq: Once | INTRAVENOUS | Status: AC

## 2021-04-07 MED ORDER — SODIUM CHLORIDE (PF) 0.9 % IJ SOLN
INTRAMUSCULAR | Status: AC
  Filled 2021-04-07: qty 50

## 2021-04-07 MED ORDER — ACETAMINOPHEN 650 MG RE SUPP: 650.0000 mg | Freq: Four times a day (QID) | RECTAL | Status: DC | PRN

## 2021-04-07 MED ORDER — FENTANYL CITRATE PF 50 MCG/ML IJ SOSY
50.0000 ug | PREFILLED_SYRINGE | INTRAMUSCULAR | Status: DC | PRN
  Administered 2021-04-08 – 2021-04-09 (×2): 50 ug via INTRAVENOUS
  Filled 2021-04-07 (×2): qty 1

## 2021-04-07 MED ORDER — ACETAMINOPHEN 325 MG PO TABS: 650.0000 mg | ORAL_TABLET | Freq: Four times a day (QID) | ORAL | Status: DC | PRN

## 2021-04-07 MED ORDER — FENTANYL CITRATE PF 50 MCG/ML IJ SOSY
200.0000 ug | PREFILLED_SYRINGE | Freq: Once | INTRAMUSCULAR | Status: AC
  Administered 2021-04-07: 100 ug via INTRAVENOUS
  Filled 2021-04-07: qty 4

## 2021-04-07 NOTE — ED Notes (Signed)
Patient is resting comfortably. 

## 2021-04-07 NOTE — ED Provider Triage Note (Signed)
Emergency Medicine Provider Triage Evaluation Note  [name] [name] , a 88 y.o. male  was evaluated in triage.  Pt complains of difficulty urinating, hx of same, last urinated this morning but only a dribble. Also with LLQ pain, mass. Hx of inguinal hernia and hernia repair in past. Pain began yesterday.  Review of Systems  Positive: Difficulty urinating, hernia Negative: Chest pain, Shob  Physical Exam  BP 115/64 (BP Location: Left Arm)    Pulse (!) 57    Temp (!) 97.4 F (36.3 C) (Oral)    Resp 18    SpO2 96%  Gen:   Awake, no distress   Resp:  Normal effort  MSK:   Moves extremities without difficulty  Other:  Painful hernia in LLQ, no redness, but TTP  Medical Decision Making  Medically screening exam initiated at 1:11 PM.  Appropriate orders placed.  Trudie Reed was informed that the remainder of the evaluation will be completed by another provider, this initial triage assessment does not replace that evaluation, and the importance of remaining in the ED until their evaluation is complete.  Hernia, difficulty urinating   Anselmo Pickler, PA-C 04/07/21 1313

## 2021-04-07 NOTE — ED Provider Notes (Signed)
[facility] DEPT Provider Note   CSN: [id number] Arrival date & time: 04/07/21  1238     History  Chief Complaint  Patient presents with   Groin Pain    [name] [name] is a 88 y.o. male.  Pt is a 88 yo male presenting for mass in left groin that was noticed this morning. Admits to hx of bilateral inguinal hernias with repair of the left side on 1961 and repair of right in 1968. Pt also admits to hx of constipation, last BM "weeks ago". Admits to hx of nausea and vomiting but none currently. Denies current abdominal or back pain. Admits to difficulty urinating.   The history is provided by the patient. No language interpreter was used.  Groin Pain Associated symptoms include abdominal pain. Pertinent negatives include no chest pain and no shortness of breath.      Home Medications Prior to Admission medications   Medication Sig Start Date End Date Taking? Authorizing Provider  clopidogrel (PLAVIX) 75 MG tablet Take 1 tablet (75 mg total) by mouth daily with breakfast. 11/26/13   Adrian Prows, MD  docusate sodium (COLACE) 100 MG capsule Take 1 capsule (100 mg total) by mouth daily. 10/15/17   Caccavale, Sophia, PA-C  docusate sodium (COLACE) 100 MG capsule Take 100 mg by mouth daily as needed for mild constipation.    [provider]  OVER THE COUNTER MEDICATION Take 1 Dose by mouth in the morning and at bedtime. House supplement---magic cup    [provider]  polyethylene glycol powder (GLYCOLAX/MIRALAX) 17 GM/SCOOP powder Take 17 g by mouth daily as needed for mild constipation.    [provider]  tamsulosin (FLOMAX) 0.4 MG CAPS capsule Take 1 capsule (0.4 mg total) by mouth daily. 11/17/17   Mesner, Corene Cornea, MD      Allergies    Lipitor [atorvastatin calcium]    Review of Systems   Review of Systems  Constitutional:  Negative for chills and fever.  HENT:  Negative for ear pain and sore throat.   Eyes:  Negative for pain  and visual disturbance.  Respiratory:  Negative for cough and shortness of breath.   Cardiovascular:  Negative for chest pain and palpitations.  Gastrointestinal:  Positive for abdominal pain and constipation. Negative for vomiting.       Inguinal mass   Genitourinary:  Positive for difficulty urinating. Negative for dysuria and hematuria.  Musculoskeletal:  Negative for arthralgias and back pain.  Skin:  Negative for color change and rash.  Neurological:  Negative for seizures and syncope.  All other systems reviewed and are negative.  Physical Exam Updated Vital Signs BP 111/63 (BP Location: Left Arm)    Pulse (!) 57    Temp (!) 97.4 F (36.3 C) (Oral)    Resp 16    SpO2 99%  Physical Exam Vitals and nursing note reviewed.  Constitutional:      General: He is not in acute distress.    Appearance: He is well-developed.  HENT:     Head: Normocephalic and atraumatic.  Eyes:     Conjunctiva/sclera: Conjunctivae normal.  Cardiovascular:     Rate and Rhythm: Normal rate and regular rhythm.     Heart sounds: No murmur heard. Pulmonary:     Effort: Pulmonary effort is normal. No respiratory distress.     Breath sounds: Normal breath sounds.  Abdominal:     Palpations: Abdomen is soft.     Tenderness: There is generalized abdominal  tenderness.    Musculoskeletal:        General: No swelling.     Cervical back: Neck supple.  Skin:    General: Skin is warm and dry.     Capillary Refill: Capillary refill takes less than 2 seconds.  Neurological:     Mental Status: He is alert and oriented to person, place, and time.     GCS: GCS eye subscore is 4. GCS verbal subscore is 5. GCS motor subscore is 6.  Psychiatric:        Mood and Affect: Mood normal.    ED Results / Procedures / Treatments   Labs (all labs ordered are listed, but only abnormal results are displayed) Labs Reviewed  CBC WITH DIFFERENTIAL/PLATELET - Abnormal; Notable for the following components:      Result  Value   WBC 2.9 (*)    RBC 3.77 (*)    Hemoglobin 11.8 (*)    HCT 35.5 (*)    RDW 15.7 (*)    Platelets 127 (*)    Lymphs Abs 0.5 (*)    All other components within normal limits  COMPREHENSIVE METABOLIC PANEL - Abnormal; Notable for the following components:   Glucose, Bld 143 (*)    All other components within normal limits  LIPASE, BLOOD - Abnormal; Notable for the following components:   Lipase 81 (*)    All other components within normal limits  URINALYSIS, ROUTINE W REFLEX MICROSCOPIC    EKG None  Radiology No results found.  Procedures .Critical Care Performed by: Lianne Cure, DO Authorized by: Lianne Cure, DO   Critical care provider statement:    Critical care time (minutes):  68   Critical care was time spent personally by me on the following activities:  Development of treatment plan with patient or surrogate, discussions with consultants, evaluation of patient's response to treatment, examination of patient, ordering and review of laboratory studies, ordering and review of radiographic studies, ordering and performing treatments and interventions, pulse oximetry, re-evaluation of patient's condition and review of old charts Comments:     Multiple discussions with admitting and general surgery. SBO secondary to incarcerated hernia    Medications Ordered in ED Medications - No data to display  ED Course/ Medical Decision Making/ A&P                           Medical Decision Making Amount and/or Complexity of Data Reviewed Radiology: ordered.  Risk Prescription drug management. Decision regarding hospitalization.   88 yo male presenting for mass in left groin that was noticed this morning with hx of bilateral hernias and repairs. Patient has 3 x 2 cm left inguinal mass on exam with tenderness to palpation. No erythema, ecchymosis. Likely inguinal hernia. Unable to reduce. Concern for incarcerated hernia without evidence of strangulation. Concern for  bowel obstruction due to findings and hx of constipation. CT abdomen/pelvis demonstrates small bowel obstruction secondary to hernia. Pt placed NPO. Declining pain meds at this time. Consult to surgery placed..   Pt also admits to difficulty with urination. States he is able to go but "just dribbles". Post void residual volume 100 ml * No urinary retention at this time. Recommend serial checks if no urine production continues.   Incidental findings: -Right renal stones. Non-obstructing. No flank pain. No stones in ureters.  -intra and extra hepatic biliary ductal dilation. Elevated lipase.    I paged both general surgery and admitting  hospitalist for admission for incarcerated hernia resulting in SBO in a 88 y.o with multiple comorbity's. Dr. Tana Coast recommends surgical admission.   I spoke with Dr. Barry Dienes with general surgery who requests medicine admission with surgery on consult due to multiple comorbidity's. I recommended she spoke with the hospitalist directly. She declined.  I re-contacted Dr. Tana Coast. Does not agree. Recommends surgery admission. I offered to provide name and number of Dr. Barry Dienes so that they could discuss this themselves. She declined. Dr. Tana Coast states she would be more than happy to admit the patient or be on consult after surgery has seen and completed a physical exam on the patient.   I attempted to place both providers in group secured messaging but Dr. Barry Dienes is offline. I re-called surgery but Dr. Barry Dienes in surgery. I left with note with nurse who answered physicians phone.   8:46 PM Dr. Barry Dienes was able to see pt in ED and completed a consult note. Secured message sent to Dr. Tana Coast for admission.   Dr. Tana Coast states she is no longer in house. Dr. Velia Meyer accepted patient for admission.         Final Clinical Impression(s) / ED Diagnoses Final diagnoses:  SBO (small bowel obstruction) (HCC)  Nephrolithiasis  Abnormal computed tomography angiography (CTA) of abdomen and  pelvis-billiary duct distension  Incarcerated hernia    Rx / DC Orders ED Discharge Orders     None         Lianne Cure, DO 99/77/41 [id number]

## 2021-04-07 NOTE — ED Triage Notes (Addendum)
Pt arrived via EMS, from [facility], c/o LLQ pain and difficulty urinating.

## 2021-04-07 NOTE — Consult Note (Addendum)
Reason for Consult:incarcerated recurrent left inguinal hernia and SBO Referring Physician: Shion Bluestein is an 88 y.o. male.  HPI:  Patient is a very sharp 88 year old male who presents with cute onset of severe left groin pain and swelling around 10 to 12 hours ago.  The pain was in a similar location to where it is when he gets kidney stones, but he has had a hernia before and recognized the bulge.  He has had bilateral hernia repairs in the 1960s.  He has had a decreased appetite since this sudden pain occurred.  He has not passed any flatus today.  He normally passes gas throughout the day.  He has been more constipated than usual and has also had more issues with urinary dribbling.  He has not had vomiting.  Denies recent illness, fevers, or chills.  Patient tells me he has "just been alive too long."  Lives in a facility and has been relatively unsteady and frail.  He is slowly been losing a bit of weight over time.  A palliative care social worker has been seeing him at the facility periodically.  Of note he has a history of heart failure and coronary artery disease.  The patient is able to tell me that in 2015 Dr. Einar Gip did a heart cath through his right arm and placed 3 stents.  He denies chest pain since then.  He has been on Plavix since that time.  He has not needed medication for his diabetes for a while given his weight loss.  Past Medical History:  Diagnosis Date   Anxiety    Asthma    "all my life"   BPH (benign prostatic hyperplasia)    "even now; after my OR" (12/28/2013)   CHF (congestive heart failure) (HCC)    Chronic bronchitis ([place])    "often"   Coronary artery disease    Depression    Diabetes mellitus without complication (HCC)    Type II   Diverticulosis of colon (without mention of hemorrhage)    GERD (gastroesophageal reflux disease)    Hiatal hernia    High cholesterol    Hypertension    Kidney stone    "I've had 7 over the years" (12/28/2013)    Pneumonia 1930's   Rhinitis     Past Surgical History:  Procedure Laterality Date   COLONOSCOPY W/ BIOPSIES AND POLYPECTOMY     CORONARY ANGIOPLASTY WITH STENT PLACEMENT  11/25/2013; 12/28/2013   "1; ?2"   CYSTOSCOPY W/ LITHOLAPAXY / EHL  X 2   INGUINAL HERNIA REPAIR Left 1962   INGUINAL HERNIA REPAIR Right 1980   LEFT HEART CATHETERIZATION WITH CORONARY ANGIOGRAM N/A 11/25/2013   Procedure: LEFT HEART CATHETERIZATION WITH CORONARY ANGIOGRAM;  Surgeon: Laverda Page, MD;  Location: [facility] CATH LAB;  Service: Cardiovascular;  Laterality: N/A;   PERCUTANEOUS CORONARY STENT INTERVENTION (PCI-S)  11/25/2013   Procedure: PERCUTANEOUS CORONARY STENT INTERVENTION (PCI-S);  Surgeon: Laverda Page, MD;  Location: [facility] CATH LAB;  Service: Cardiovascular;;   PERCUTANEOUS CORONARY STENT INTERVENTION (PCI-S) N/A 12/28/2013   Procedure: PERCUTANEOUS CORONARY STENT INTERVENTION (PCI-S);  Surgeon: Laverda Page, MD;  Location: [facility] CATH LAB;  Service: Cardiovascular;  Laterality: N/A;   PROSTATE SURGERY     TONSILLECTOMY  1937   TRANSURETHRAL RESECTION OF PROSTATE  ~ 2007    Family History  Problem Relation Age of Onset   Heart disease Mother    Stroke Mother    Heart disease Father  Diabetes Brother     Social History:  reports that he has quit smoking. He has never used smokeless tobacco. He reports current alcohol use. He reports that he does not use drugs.  Allergies:  Allergies  Allergen Reactions   Lipitor [Atorvastatin Calcium] Other (See Comments)    Irregular lab results--pt unsure of exact lab    Medications:  Plavix Flomax colace  Results for orders placed or performed during the hospital encounter of 04/07/21 (from the past 48 hour(s))  CBC with Differential     Status: Abnormal   Collection Time: 04/07/21  2:00 PM  Result Value Ref Range   WBC 2.9 (L) 4.0 - 10.5 K/uL   RBC 3.77 (L) 4.22 - 5.81 MIL/uL   Hemoglobin 11.8 (L) 13.0 - 17.0 g/dL   HCT 35.5 (L) 39.0 - 52.0 %    MCV 94.2 80.0 - 100.0 fL   MCH 31.3 26.0 - 34.0 pg   MCHC 33.2 30.0 - 36.0 g/dL   RDW 15.7 (H) 11.5 - 15.5 %   Platelets 127 (L) 150 - 400 K/uL    Comment: Immature Platelet Fraction may be clinically indicated, consider ordering this additional test [id number]    nRBC 0.0 0.0 - 0.2 %   Neutrophils Relative % 75 %   Neutro Abs 2.2 1.7 - 7.7 K/uL   Lymphocytes Relative 18 %   Lymphs Abs 0.5 (L) 0.7 - 4.0 K/uL   Monocytes Relative 6 %   Monocytes Absolute 0.2 0.1 - 1.0 K/uL   Eosinophils Relative 1 %   Eosinophils Absolute 0.0 0.0 - 0.5 K/uL   Basophils Relative 0 %   Basophils Absolute 0.0 0.0 - 0.1 K/uL   Immature Granulocytes 0 %   Abs Immature Granulocytes 0.01 0.00 - 0.07 K/uL    Comment: Performed at [facility], [place] [address] [place], [place] [address]  Comprehensive metabolic panel     Status: Abnormal   Collection Time: 04/07/21  2:00 PM  Result Value Ref Range   Sodium 137 135 - 145 mmol/L   Potassium 4.0 3.5 - 5.1 mmol/L   Chloride 100 98 - 111 mmol/L   CO2 30 22 - 32 mmol/L   Glucose, Bld 143 (H) 70 - 99 mg/dL    Comment: Glucose reference range applies only to samples taken after fasting for at least 8 hours.   BUN 18 8 - 23 mg/dL   Creatinine, Ser 0.70 0.61 - 1.24 mg/dL   Calcium 9.1 8.9 - 10.3 mg/dL   Total Protein 6.8 6.5 - 8.1 g/dL   Albumin 4.0 3.5 - 5.0 g/dL   AST 17 15 - 41 U/L   ALT 11 0 - 44 U/L   Alkaline Phosphatase 64 38 - 126 U/L   Total Bilirubin 0.7 0.3 - 1.2 mg/dL   GFR, Estimated >60 >60 mL/min    Comment: (NOTE) Calculated using the CKD-EPI Creatinine Equation (2021)    Anion gap 7 5 - 15    Comment: Performed at [facility], [place] [address] [place], [place] [address]  Lipase, blood     Status: Abnormal   Collection Time: 04/07/21  2:00 PM  Result Value Ref Range   Lipase 81 (H) 11 - 51 U/L    Comment: Performed at [facility], [place] [address] [place],  [address]     CT ABDOMEN PELVIS W CONTRAST  Result Date: 04/07/2021 CLINICAL DATA:  A 88 year old male presents for evaluation of constipation  and emesis, concern for bowel obstruction, presenting with LEFT inguinal mass. EXAM: CT ABDOMEN AND PELVIS WITH CONTRAST TECHNIQUE: Multidetector CT imaging of the abdomen and pelvis was performed using the standard protocol following bolus administration of intravenous contrast. RADIATION DOSE REDUCTION: This exam was performed according to the departmental dose-optimization program which includes automated exposure control, adjustment of the mA and/or kV according to patient size and/or use of iterative reconstruction technique. CONTRAST:  175mL OMNIPAQUE IOHEXOL 300 MG/ML  SOLN COMPARISON:  Comparison made with November 17, 2017. FINDINGS: Lower chest: Lung bases are clear. Heart size is enlarged. Signs of coronary artery disease. Thinning of the LEFT ventricular apex as before. Hepatobiliary: Liver without focal lesion. Portal vein is patent. Gallbladder is contracted. There is mild intrahepatic biliary duct distension that is new from previous imaging. Pancreas: Mild pancreatic atrophy. No substantial pancreatic duct distension. Spleen: Normal. Adrenals/Urinary Tract: Adrenal glands are normal. RIGHT renal cysts. Nephrolithiasis with calculi in the interpolar RIGHT kidney and lower pole of the RIGHT kidney. Interpolar calculus similar to the prior study. Lower pole calculus new compared to previous imaging. No suspicious renal lesion. No perivesical stranding. No hydronephrosis. Stomach/Bowel: Abundant stool is present in the colon. No surrounding stranding. The appendix is normal. Recurrent LEFT inguinal hernia contains a focally dilated small bowel loops with mild surrounding stranding. Abdominal wall defect best seen on the sagittal images, image 112/5) defect approximately 5 mm. Diffuse bowel dilation upstream from the herniated bowel. Hernia likely occurs at the level of  the proximal ileum. Bowel can be seen crossing from RIGHT to LEFT in the abdomen towards the ileocecal valve. This bowel is decompressed/collapsed in the RIGHT upper quadrant. Distortion of mesenteric vessels likely due to bowel distension and presence of hernia in the LEFT lower quadrant distorting bowel loops. Bowel upstream from the hernia is dilated up to 3 cm. No signs of pneumatosis. Colonic diverticulosis. Vascular/Lymphatic: Aortic atherosclerosis. No sign of aneurysm. Smooth contour of the IVC. There is no gastrohepatic or hepatoduodenal ligament lymphadenopathy. No retroperitoneal or mesenteric lymphadenopathy. No pelvic sidewall lymphadenopathy. Reproductive: Mild prostatomegaly with heterogeneity. Other: Small volume fluid in the abdomen along the RIGHT pericolic gutter. Diffuse mild mesenteric edema, difficult to quantify given the lack of mesenteric fat. No pneumoperitoneum. Musculoskeletal: Spinal degenerative changes. No acute or destructive bone process. IMPRESSION: 1. Recurrent LEFT inguinal hernia leading to small bowel obstruction either high-grade partial or complete small bowel obstruction in the LEFT inguinal region. Defect in the abdominal wall fascia is narrow, based on this the hernia is at high-risk for strangulation or incarceration. Surgical consultation is suggested. 2. Small volume fluid in the abdomen along the RIGHT pericolic gutter. No pneumatosis or free air currently. 3. Diffuse mild mesenteric edema, difficult to quantify given the lack of mesenteric fat. 4. Mild intra and extrahepatic biliary duct distension that is new from previous imaging. Correlation with hepatic function tests may be helpful to determine whether follow-up or further assessment may be warranted. 5. Nonobstructing right nephrolithiasis. 6. Colonic diverticulosis without evidence of acute diverticulitis. 7. Aortic atherosclerosis. Aortic Atherosclerosis (ICD10-I70.0). Electronically Signed   By: Zetta Bills  M.D.   On: 04/07/2021 16:57    Review of Systems  Constitutional:  Positive for unexpected weight change (slow weight loss).  HENT: Negative.    Eyes: Negative.   Respiratory: Negative.    Cardiovascular: Negative.   Gastrointestinal:  Positive for constipation. Negative for abdominal distention, abdominal pain, nausea, rectal pain and vomiting.  Endocrine: Negative.   Genitourinary:  Positive for difficulty urinating.       Left inguinal hernia  Musculoskeletal: Negative.   Skin:  Positive for pallor.  Allergic/Immunologic: Negative.   Neurological: Negative.   Hematological:  Bruises/bleeds easily.  Psychiatric/Behavioral: Negative.    All other systems reviewed and are negative. Blood pressure 121/72, pulse 74, temperature (!) 97.4 F (36.3 C), temperature source Oral, resp. rate 16, SpO2 97 %. Physical Exam Vitals reviewed.  Constitutional:      General: He is in acute distress (looks uncomfortable).     Comments: cachectic  HENT:     Nose: Nose normal. No congestion or rhinorrhea.     Mouth/Throat:     Mouth: Mucous membranes are dry.  Eyes:     General: No scleral icterus.       Right eye: No discharge.        Left eye: No discharge.     Conjunctiva/sclera: Conjunctivae normal.  Cardiovascular:     Rate and Rhythm: Normal rate and regular rhythm.     Pulses: Normal pulses.  Pulmonary:     Effort: Pulmonary effort is normal. No respiratory distress.  Chest:     Chest wall: No tenderness.  Abdominal:     General: Abdomen is flat. There is no distension.     Palpations: Abdomen is soft.     Tenderness: There is no abdominal tenderness. There is no rebound.     Hernia: A hernia (left inguinal hernia) is present.  Musculoskeletal:        General: No swelling, tenderness, deformity or signs of injury.     Cervical back: Normal range of motion and neck supple. No rigidity or tenderness.  Skin:    General: Skin is warm and dry.     Capillary Refill: Capillary refill  takes 2 to 3 seconds.     Coloration: Skin is pale.  Neurological:     Mental Status: He is alert and oriented to person, place, and time.  Psychiatric:        Mood and Affect: Mood normal.        Behavior: Behavior normal.        Thought Content: Thought content normal.        Judgment: Judgment normal.    Assessment/Plan: Incarcerated recurrent left inguinal hernia SBO Urinary retention Constipation CAD on plavix DM H/o CHF Asthma Frailty Advanced age Elevated lipase.  I was able to get it reduced with one dose of fentanyl but it was a bit of a small defect.    I called his nephew's number and spoke to his wife.  He was not available for discussion tonight.    Recommend admit for SBO and hold plavix.  Surgery this admission will depend on patient wishes and whether it recurs quickly.    Given that I was able to reduce the hernia, I think we can probably get by without an NGT this evening.    Aggressive control of his constipation and improvement in his urinary retention can decrease the pressure on his abdominal wall which may make it less likely that the hernia will recur quickly.  The patient lives in a facility and is DNR.  I discussed briefly that if we were to do surgery on this hernia that he would need to decide with his family if he wanted to have a temporary hold on the DNR for surgery or not.  That is traditionally what we would do as there are reversible things that can happen during operations.  He is going to think about this.  Answered his information to the ACS risk calculator.  He had slightly over 20% risk of serious complication or any complication.  There is approximately a 20% risk of readmission.  There is 8.3% chance of pneumonia, around a 5% risk of sepsis, and around a 16% chance of death.  Complex medical decision making.    Stark Klein 04/07/2021, 7:26 PM

## 2021-04-07 NOTE — ED Notes (Signed)
Dr. Barry Dienes at bedside to attempt reduce hernia. Pt tolerating well. Hernia reduced successfully

## 2021-04-08 ENCOUNTER — Encounter (HOSPITAL_COMMUNITY): Payer: Self-pay | Admitting: Internal Medicine

## 2021-04-08 ENCOUNTER — Inpatient Hospital Stay (HOSPITAL_COMMUNITY): Payer: Medicare Other

## 2021-04-08 DIAGNOSIS — N400 Enlarged prostate without lower urinary tract symptoms: Secondary | ICD-10-CM | POA: Diagnosis present

## 2021-04-08 DIAGNOSIS — N19 Unspecified kidney failure: Secondary | ICD-10-CM | POA: Diagnosis present

## 2021-04-08 DIAGNOSIS — K56609 Unspecified intestinal obstruction, unspecified as to partial versus complete obstruction: Secondary | ICD-10-CM | POA: Diagnosis not present

## 2021-04-08 DIAGNOSIS — E860 Dehydration: Secondary | ICD-10-CM | POA: Diagnosis present

## 2021-04-08 DIAGNOSIS — K4090 Unilateral inguinal hernia, without obstruction or gangrene, not specified as recurrent: Secondary | ICD-10-CM

## 2021-04-08 DIAGNOSIS — R338 Other retention of urine: Secondary | ICD-10-CM

## 2021-04-08 DIAGNOSIS — D696 Thrombocytopenia, unspecified: Secondary | ICD-10-CM

## 2021-04-08 DIAGNOSIS — Q445 Other congenital malformations of bile ducts: Secondary | ICD-10-CM

## 2021-04-08 HISTORY — DX: Thrombocytopenia, unspecified: D69.6

## 2021-04-08 LAB — CBC WITH DIFFERENTIAL/PLATELET
Abs Immature Granulocytes: 0.02 10*3/uL (ref 0.00–0.07)
Basophils Absolute: 0 10*3/uL (ref 0.0–0.1)
Basophils Relative: 0 %
Eosinophils Absolute: 0 10*3/uL (ref 0.0–0.5)
Eosinophils Relative: 0 %
HCT: 31.6 % — ABNORMAL LOW (ref 39.0–52.0)
Hemoglobin: 10.3 g/dL — ABNORMAL LOW (ref 13.0–17.0)
Immature Granulocytes: 0 %
Lymphocytes Relative: 12 %
Lymphs Abs: 0.6 10*3/uL — ABNORMAL LOW (ref 0.7–4.0)
MCH: 31.2 pg (ref 26.0–34.0)
MCHC: 32.6 g/dL (ref 30.0–36.0)
MCV: 95.8 fL (ref 80.0–100.0)
Monocytes Absolute: 0.4 10*3/uL (ref 0.1–1.0)
Monocytes Relative: 8 %
Neutro Abs: 3.5 10*3/uL (ref 1.7–7.7)
Neutrophils Relative %: 80 %
Platelets: 115 10*3/uL — ABNORMAL LOW (ref 150–400)
RBC: 3.3 MIL/uL — ABNORMAL LOW (ref 4.22–5.81)
RDW: 15.8 % — ABNORMAL HIGH (ref 11.5–15.5)
WBC: 4.5 10*3/uL (ref 4.0–10.5)
nRBC: 0 % (ref 0.0–0.2)

## 2021-04-08 LAB — COMPREHENSIVE METABOLIC PANEL
ALT: 11 U/L (ref 0–44)
AST: 18 U/L (ref 15–41)
Albumin: 3.4 g/dL — ABNORMAL LOW (ref 3.5–5.0)
Alkaline Phosphatase: 53 U/L (ref 38–126)
Anion gap: 4 — ABNORMAL LOW (ref 5–15)
BUN: 16 mg/dL (ref 8–23)
CO2: 30 mmol/L (ref 22–32)
Calcium: 8.7 mg/dL — ABNORMAL LOW (ref 8.9–10.3)
Chloride: 103 mmol/L (ref 98–111)
Creatinine, Ser: 0.73 mg/dL (ref 0.61–1.24)
GFR, Estimated: >60 mL/min (ref >60)
Glucose, Bld: 88 mg/dL (ref 70–99)
Potassium: 4.6 mmol/L (ref 3.5–5.1)
Sodium: 137 mmol/L (ref 135–145)
Total Bilirubin: 0.5 mg/dL (ref 0.3–1.2)
Total Protein: 5.9 g/dL — ABNORMAL LOW (ref 6.5–8.1)

## 2021-04-08 LAB — URINALYSIS, ROUTINE W REFLEX MICROSCOPIC
Bilirubin Urine: NEGATIVE
Glucose, UA: NEGATIVE mg/dL
Hgb urine dipstick: NEGATIVE
Ketones, ur: 5 mg/dL — AB
Leukocytes,Ua: NEGATIVE
Nitrite: NEGATIVE
Protein, ur: NEGATIVE mg/dL
Specific Gravity, Urine: >1.046 — ABNORMAL HIGH (ref 1.005–1.030)
pH: 6 (ref 5.0–8.0)

## 2021-04-08 LAB — GLUCOSE, CAPILLARY
Glucose-Capillary: 116 mg/dL — ABNORMAL HIGH (ref 70–99)
Glucose-Capillary: 161 mg/dL — ABNORMAL HIGH (ref 70–99)
Glucose-Capillary: 53 mg/dL — ABNORMAL LOW (ref 70–99)
Glucose-Capillary: 72 mg/dL (ref 70–99)
Glucose-Capillary: 75 mg/dL (ref 70–99)
Glucose-Capillary: 87 mg/dL (ref 70–99)

## 2021-04-08 LAB — MAGNESIUM
Magnesium: 2 mg/dL (ref 1.7–2.4)
Magnesium: 2.1 mg/dL (ref 1.7–2.4)

## 2021-04-08 LAB — PROTIME-INR
INR: 1.2 (ref 0.8–1.2)
Prothrombin Time: 15 seconds (ref 11.4–15.2)

## 2021-04-08 MED ORDER — CHLORHEXIDINE GLUCONATE CLOTH 2 % EX PADS
6.0000 | MEDICATED_PAD | Freq: Every day | CUTANEOUS | Status: DC
  Administered 2021-04-08 – 2021-04-10 (×3): 6 via TOPICAL

## 2021-04-08 MED ORDER — DEXTROSE 50 % IV SOLN
INTRAVENOUS | Status: AC
  Filled 2021-04-08: qty 50

## 2021-04-08 MED ORDER — POLYETHYLENE GLYCOL 3350 17 G PO PACK
17.0000 g | PACK | Freq: Two times a day (BID) | ORAL | Status: DC
  Administered 2021-04-08 – 2021-04-10 (×2): 17 g via ORAL
  Filled 2021-04-08 (×4): qty 1

## 2021-04-08 MED ORDER — INSULIN ASPART 100 UNIT/ML IJ SOLN: 0.0000 [IU] | Freq: Four times a day (QID) | INTRAMUSCULAR | Status: DC

## 2021-04-08 MED ORDER — POLYETHYLENE GLYCOL 3350 17 G PO PACK
17.0000 g | PACK | Freq: Every day | ORAL | Status: DC
  Administered 2021-04-08: 17 g via ORAL
  Filled 2021-04-08: qty 1

## 2021-04-08 MED ORDER — LACTATED RINGERS IV SOLN: INTRAVENOUS | Status: DC

## 2021-04-08 MED ORDER — DEXTROSE 50 % IV SOLN
25.0000 g | INTRAVENOUS | Status: AC
  Administered 2021-04-08: 25 g via INTRAVENOUS

## 2021-04-08 MED ORDER — DEXTROSE IN LACTATED RINGERS 5 % IV SOLN: INTRAVENOUS | Status: DC

## 2021-04-08 NOTE — H&P (Signed)
History and Physical    PLEASE NOTE THAT DRAGON DICTATION SOFTWARE WAS USED IN THE CONSTRUCTION OF THIS NOTE.   [name] [name] BSJ:[id number] DOB: 06-07-1928 DOA: 04/07/2021  PCP: Antony Contras, MD  Patient coming from: home   I have personally briefly reviewed patient's old medical records in [place]  Chief Complaint: Abdominal pain  HPI: [name] [name] is a 88 y.o. male with medical history significant for bilateral inguinal hernias, status post inguinal hernia repair 1980 and status post left inguinal hernia repair 1962, BPH, who is admitted to [facility] on 04/07/2021 with small bowel obstruction in the setting of incarcerated left inguinal hernia after presenting from home to [facility] ED complaining of abdominal pain.   The patient reports 1 to 2 days of progressive abdominal most prominent in the bilateral lower abdominal quadrants.  Describes pain as constant sharp, and nonradiating in nature.  Notes exacerbation with palpation over the lower portion of the abdomen as well as with movement.  Over the last week, he is noted relative constipation, noting decline in frequency of bowel movements over that timeframe, with most recent bowel movement occurring 2 to 3 days ago.  Denies any associated recent melena or hematochezia.  Reports recent decline in flatus production. over the last 1 to 2 days, he also notes intermittent nausea resulting in 2 episodes of nonbloody, nonbilious emesis, with most recent episode of emesis occurring just prior to presenting to [facility] emergency department today.  Denies any associated recent dysuria, gross hematuria, or change in urinary urgency/frequency.  No recent subjective fever, chills, rigors, or generalized myalgias.  No recent trauma or travel.  Denies any known prior history of small bowel obstruction.  He has a documented history of bilateral inguinal hernias, status post right inguinal hernia repair in 1980 as well as left inguinal  hernia repair 1962.  Otherwise, patient does not recall any prior abdominal surgeries.     ED Course:  Vital signs in the ED were notable for the following: Afebrile; heart rate 57-74; blood pressure 104/57; respiratory rate 16-20, oxygen saturation 95 to 99% on room air.  Labs were notable for the following: CMP notable for the following: BUN 18, creatinine 0.70, relative to most recent prior serum creatinine data point of 0.6 in December 2021, BUN to creatinine ratio 25.7, glucose 143, liver enzymes within normal limits.  Urinalysis ordered, with result currently pending.  COVID-19/influenza PCR negative.  Imaging and additional notable ED work-up: CT abdomen/pelvis showed recurrent left inguinal hernia leading to small bowel obstruction, either high-grade partial or complete small bowel obstruction in the left inguinal region, with the defect in the abdominal wall fascia noted to be narrow, rendering this hernia to be at high risk for incarceration.  CT abdomen/pelvis also noted diverticulosis in the absence of evidence of diverticulitis.  No evidence of bowel abscess or perforation.   EDP discussed the patient's case with the on-call general surgeon, Dr. Barry Dienes , Who has evaluated the patient in the ED, and reduced to the left inguinal hernia.  Dr. Barry Dienes to formally consult, and admission to the hospitalist service for further evaluation management of presenting small bowel obstruction in the setting of incarcerated left inguinal hernia.  While in the ED, the following were administered: Fentanyl 200 mcg IV x1, morphine 4 mg IV x1, Zofran 4 mg IV x1.  Subsequently, the patient was admitted for further evaluation and management of small bowel obstruction in the setting of incarcerated left inguinal hernia,  with presentation complicated by clinical evidence of dehydration, including acute prerenal azotemia.     Review of Systems: As per HPI otherwise 10 point review of systems negative.    Past Medical History:  Diagnosis Date   Anxiety    Asthma    "all my life"   BPH (benign prostatic hyperplasia)    "even now; after my OR" (12/28/2013)   CHF (congestive heart failure) (HCC)    Chronic bronchitis ([place])    "often"   Coronary artery disease    Depression    Diabetes mellitus without complication (HCC)    Type II   Diverticulosis of colon (without mention of hemorrhage)    GERD (gastroesophageal reflux disease)    Hiatal hernia    High cholesterol    Hypertension    Kidney stone    "I've had 7 over the years" (12/28/2013)   Pneumonia 1930's   Rhinitis     Past Surgical History:  Procedure Laterality Date   COLONOSCOPY W/ BIOPSIES AND POLYPECTOMY     CORONARY ANGIOPLASTY WITH STENT PLACEMENT  11/25/2013; 12/28/2013   "1; ?2"   CYSTOSCOPY W/ LITHOLAPAXY / EHL  X 2   INGUINAL HERNIA REPAIR Left 1962   INGUINAL HERNIA REPAIR Right 1980   LEFT HEART CATHETERIZATION WITH CORONARY ANGIOGRAM N/A 11/25/2013   Procedure: LEFT HEART CATHETERIZATION WITH CORONARY ANGIOGRAM;  Surgeon: Laverda Page, MD;  Location: [facility] C[facility] LAB;  Service: Cardiovascular;  Laterality: N/A;   PERCUTANEOUS CORONARY STENT INTERVENTION (PCI-S)  11/25/2013   Procedure: PERCUTANEOUS CORONARY STENT INTERVENTION (PCI-S);  Surgeon: Laverda Page, MD;  Location: [facility] CATH LAB;  Service: Cardiovascular;;   PERCUTANEOUS CORONARY STENT INTERVENTION (PCI-S) N/A 12/28/2013   Procedure: PERCUTANEOUS CORONARY STENT INTERVENTION (PCI-S);  Surgeon: Laverda Page, MD;  Location: [facility] CATH LAB;  Service: Cardiovascular;  Laterality: N/A;   PROSTATE SURGERY     TONSILLECTOMY  1937   TRANSURETHRAL RESECTION OF PROSTATE  ~ 2007    Social History:  reports that he has quit smoking. He has never used smokeless tobacco. He reports current alcohol use. He reports that he does not use drugs.   Allergies  Allergen Reactions   Lipitor [Atorvastatin Calcium] Other (See Comments)    Irregular lab results--pt  unsure of exact lab    Family History  Problem Relation Age of Onset   Heart disease Mother    Stroke Mother    Heart disease Father    Diabetes Brother     Family history reviewed and not pertinent    Prior to Admission medications   Medication Sig Start Date End Date Taking? Authorizing Provider  clopidogrel (PLAVIX) 75 MG tablet Take 1 tablet (75 mg total) by mouth daily with breakfast. 11/26/13  Yes Adrian Prows, MD  docusate sodium (COLACE) 100 MG capsule Take 1 capsule (100 mg total) by mouth daily. 10/15/17  Yes Caccavale, Sophia, PA-C  tamsulosin (FLOMAX) 0.4 MG CAPS capsule Take 1 capsule (0.4 mg total) by mouth daily. 11/17/17  Yes Mesner, Corene Cornea, MD     Objective    Physical Exam: Vitals:   04/07/21 2230 04/07/21 2300 04/07/21 2330 04/08/21 0026  BP: 117/67 112/68 117/65 112/60  Pulse: 62 71 73 71  Resp:    17  Temp:    97.8 F (36.6 C)  TempSrc:    Oral  SpO2: 96% 97% 96% 96%    General: appears to be stated age; alert, oriented Skin: warm, dry, no rash Head:  AT/[place] Mouth:  Oral mucosa membranes appear dry, normal dentition Neck: supple; trachea midline Heart:  RRR; did not appreciate any M/R/G Lungs: CTAB, did not appreciate any wheezes, rales, or rhonchi Abdomen: + BS; soft, ND, tenderness to the bilateral lower abdominal quadrants in the absence of any associated guarding, rigidity, or rebound tenderness Vascular: 2+ pedal pulses b/l; 2+ radial pulses b/l Extremities: no peripheral edema, no muscle wasting Neuro: strength and sensation intact in upper and lower extremities b/l    Labs on Admission: I have personally reviewed following labs and imaging studies  CBC: Recent Labs  Lab 04/07/21 1400  WBC 2.9*  NEUTROABS 2.2  HGB 11.8*  HCT 35.5*  MCV 94.2  PLT 211*   Basic Metabolic Panel: Recent Labs  Lab 04/07/21 1400  NA 137  K 4.0  CL 100  CO2 30  GLUCOSE 143*  BUN 18  CREATININE 0.70  CALCIUM 9.1   GFR: CrCl cannot be calculated  (Unknown ideal weight.). Liver Function Tests: Recent Labs  Lab 04/07/21 1400  AST 17  ALT 11  ALKPHOS 64  BILITOT 0.7  PROT 6.8  ALBUMIN 4.0   Recent Labs  Lab 04/07/21 1400  LIPASE 81*   No results for input(s): AMMONIA in the last 168 hours. Coagulation Profile: No results for input(s): INR, PROTIME in the last 168 hours. Cardiac Enzymes: No results for input(s): CKTOTAL, CKMB, CKMBINDEX, TROPONINI in the last 168 hours. BNP (last 3 results) No results for input(s): PROBNP in the last 8760 hours. HbA1C: No results for input(s): HGBA1C in the last 72 hours. CBG: No results for input(s): GLUCAP in the last 168 hours. Lipid Profile: No results for input(s): CHOL, HDL, LDLCALC, TRIG, CHOLHDL, LDLDIRECT in the last 72 hours. Thyroid Function Tests: No results for input(s): TSH, T4TOTAL, FREET4, T3FREE, THYROIDAB in the last 72 hours. Anemia Panel: No results for input(s): VITAMINB12, FOLATE, FERRITIN, TIBC, IRON, RETICCTPCT in the last 72 hours. Urine analysis:    Component Value Date/Time   COLORURINE YELLOW 11/17/2017 1122   APPEARANCEUR CLEAR 11/17/2017 1122   LABSPEC >1.046 (H) 11/17/2017 1122   PHURINE 6.0 11/17/2017 1122   GLUCOSEU NEGATIVE 11/17/2017 1122   GLUCOSEU NEGATIVE 10/13/2006 1709   HGBUR LARGE (A) 11/17/2017 1122   BILIRUBINUR NEGATIVE 11/17/2017 1122   BILIRUBINUR negative 10/11/2016 1809   KETONESUR 20 (A) 11/17/2017 1122   PROTEINUR NEGATIVE 11/17/2017 1122   UROBILINOGEN 0.2 10/11/2016 1809   UROBILINOGEN 1.0 03/09/2007 1122   NITRITE NEGATIVE 11/17/2017 1122   LEUKOCYTESUR NEGATIVE 11/17/2017 1122    Radiological Exams on Admission: CT ABDOMEN PELVIS W CONTRAST  Result Date: 04/07/2021 CLINICAL DATA:  A 88 year old male presents for evaluation of constipation and emesis, concern for bowel obstruction, presenting with LEFT inguinal mass. EXAM: CT ABDOMEN AND PELVIS WITH CONTRAST TECHNIQUE: Multidetector CT imaging of the abdomen and pelvis  was performed using the standard protocol following bolus administration of intravenous contrast. RADIATION DOSE REDUCTION: This exam was performed according to the departmental dose-optimization program which includes automated exposure control, adjustment of the mA and/or kV according to patient size and/or use of iterative reconstruction technique. CONTRAST:  17mL OMNIPAQUE IOHEXOL 300 MG/ML  SOLN COMPARISON:  Comparison made with November 17, 2017. FINDINGS: Lower chest: Lung bases are clear. Heart size is enlarged. Signs of coronary artery disease. Thinning of the LEFT ventricular apex as before. Hepatobiliary: Liver without focal lesion. Portal vein is patent. Gallbladder is contracted. There is mild intrahepatic biliary duct distension that is new from previous imaging. Pancreas:  Mild pancreatic atrophy. No substantial pancreatic duct distension. Spleen: Normal. Adrenals/Urinary Tract: Adrenal glands are normal. RIGHT renal cysts. Nephrolithiasis with calculi in the interpolar RIGHT kidney and lower pole of the RIGHT kidney. Interpolar calculus similar to the prior study. Lower pole calculus new compared to previous imaging. No suspicious renal lesion. No perivesical stranding. No hydronephrosis. Stomach/Bowel: Abundant stool is present in the colon. No surrounding stranding. The appendix is normal. Recurrent LEFT inguinal hernia contains a focally dilated small bowel loops with mild surrounding stranding. Abdominal wall defect best seen on the sagittal images, image 112/5) defect approximately 5 mm. Diffuse bowel dilation upstream from the herniated bowel. Hernia likely occurs at the level of the proximal ileum. Bowel can be seen crossing from RIGHT to LEFT in the abdomen towards the ileocecal valve. This bowel is decompressed/collapsed in the RIGHT upper quadrant. Distortion of mesenteric vessels likely due to bowel distension and presence of hernia in the LEFT lower quadrant distorting bowel loops. Bowel  upstream from the hernia is dilated up to 3 cm. No signs of pneumatosis. Colonic diverticulosis. Vascular/Lymphatic: Aortic atherosclerosis. No sign of aneurysm. Smooth contour of the IVC. There is no gastrohepatic or hepatoduodenal ligament lymphadenopathy. No retroperitoneal or mesenteric lymphadenopathy. No pelvic sidewall lymphadenopathy. Reproductive: Mild prostatomegaly with heterogeneity. Other: Small volume fluid in the abdomen along the RIGHT pericolic gutter. Diffuse mild mesenteric edema, difficult to quantify given the lack of mesenteric fat. No pneumoperitoneum. Musculoskeletal: Spinal degenerative changes. No acute or destructive bone process. IMPRESSION: 1. Recurrent LEFT inguinal hernia leading to small bowel obstruction either high-grade partial or complete small bowel obstruction in the LEFT inguinal region. Defect in the abdominal wall fascia is narrow, based on this the hernia is at high-risk for strangulation or incarceration. Surgical consultation is suggested. 2. Small volume fluid in the abdomen along the RIGHT pericolic gutter. No pneumatosis or free air currently. 3. Diffuse mild mesenteric edema, difficult to quantify given the lack of mesenteric fat. 4. Mild intra and extrahepatic biliary duct distension that is new from previous imaging. Correlation with hepatic function tests may be helpful to determine whether follow-up or further assessment may be warranted. 5. Nonobstructing right nephrolithiasis. 6. Colonic diverticulosis without evidence of acute diverticulitis. 7. Aortic atherosclerosis. Aortic Atherosclerosis (ICD10-I70.0). Electronically Signed   By: Zetta Bills M.D.   On: 04/07/2021 16:57      Assessment/Plan    Principal Problem:   SBO (small bowel obstruction) (HCC) Active Problems:   Diabetes mellitus without complication (HCC)   Left inguinal hernia   Dehydration   Acute prerenal azotemia   BPH (benign prostatic hyperplasia)     #) Small bowel  obstruction: dx on the basis of acute onset abdominal pain associated with nausea/vomiting and diminished flatus production, with CT abdomen/pelvis demonstrating evidence of recurrent left inguinal hernia leading to small bowel obstruction small bowel obstruction, either high-grade partial or complete SBO in the left inguinal region, without evidence of abscess or perforation.  No evidence of peritoneal signs on initial physical exam.   patient's case and imaging were d/w the on-call general surgeon, Dr. Barry Dienes , Who has evaluated the patient in the ED, and reduced to the left inguinal hernia.  Dr. Barry Dienes to formally consult, and recs admission to the hospitalist service for further evaluation management of presenting small bowel obstruction in the setting of incarcerated left inguinal hernia. Additional gen surg recs to following, including recs regarding ngt. For now, will initiate conservative measures, and observe for return of normal bowel function,  as described below.     Plan: Strict NPO. LR @ 75 cc per hour. Prn IV fentanyl. Prn IV Zofran.  SCD's for DVT prophylaxis in case the patient should ultimately require surgical intervention.  Recheck CMP and CBC in the morning. Check INR. General surgery consult, as above.      #) Incarcerated left inguinal hernia: In the setting of a history of left inguinal hernia status postrepair 1962, today's imaging demonstrates recurrence of the left inguinal hernia leading to aforementioned small bowel obstruction in the left inguinal region, as further detailed above.  On-call general surgery consulted, with Dr. Barry Dienes Evaluated the patient in the ED today reducing the left inguinal hernia.   Plan: General surgery consulted, as above.  Prn IV fentanyl, as above.  Further evaluation management of consequential small bowel striction, as further detailed above.        #) Dehydration: Clinical suspicion for such, including the appearance of dry oral mucous  membranes as well as laboratory findings notable for acute prerenal azotemia. Appears to be in the setting of recent decline in intake of water as well as increasing GI losses in the form of recent nausea/vomiting, as above.  No e/o associated hypotension.  In the setting of presenting small bowel striction, will keep n.p.o., provide IV fluid resuscitation, as further detailed below.  Of note, UA result currently pending.   Plan: Monitor strict I's and O's.  Daily weights.  Repeat BMP in the morning. IVF's in form of lactated Ringer's at 75 cc/h.  Follow-up result urinalysis.          #) Benign Prostatic Hyperplasia:  documented h/o such; on tamsulosin as outpatient.   Plan: monitor strict I's & O's and daily weights. Repeat BMP in AM.  In the setting of current n.p.o. status, will hold home tamsulosin for now.        #) Type 2 Diabetes Mellitus: documented history of such.  Managed via lifestyle modifications as an outpatient, in the absence of any exogenous insulin or oral hypoglycemic agents at home.  Presenting blood sugar noted to be 143.  Plan: In the setting of current n.p.o. status, accuchecks every 6 hours with very low dose SSI.        DVT prophylaxis: SCD's   Code Status: Full code Family Communication: none Disposition Plan: Per Rounding Team Consults called: Dr. Barry Dienes of general surgery consulted, as further detailed above;  Admission status: Inpatient; MedSurg   PLEASE NOTE THAT DRAGON DICTATION SOFTWARE WAS USED IN THE CONSTRUCTION OF THIS NOTE.   Arley DO [facility] [place]   04/08/2021, 1:57 AM

## 2021-04-08 NOTE — Assessment & Plan Note (Addendum)
CT with mild intra and extrahepatic biliary duct distension LFT's wnl Will follow RUQ [place] -> prominent proximal CBD, no significant dilation of intrahepatic bile duct LFT's wnl, would recommend outpatient follow up

## 2021-04-08 NOTE — Assessment & Plan Note (Signed)
BPH

## 2021-04-08 NOTE — Assessment & Plan Note (Addendum)
Foley placed 2/12 flomax Trial of void 2/14 (today)

## 2021-04-08 NOTE — Progress Notes (Signed)
Subjective/Chief Complaint: Denies pain this AM, but did require a dose of fentanyl overnight.  Passing a small amount of gas.     Objective: Vital signs in last 24 hours: Temp:  [97.4 F (36.3 C)-98.2 F (36.8 C)] 98.2 F (36.8 C) (02/12 0517) Pulse Rate:  [56-74] 59 (02/12 0517) Resp:  [14-20] 14 (02/12 0517) BP: (98-129)/(52-75) 98/52 (02/12 0517) SpO2:  [95 %-99 %] 95 % (02/12 0517) Last BM Date: 04/07/21  Intake/Output from previous day: 02/11 0701 - 02/12 0700 In: 1000 [I.V.:1000] Out: 600 [Urine:600] Intake/Output this shift: No intake/output data recorded.  General appearance: sleepy, but arousable and appropriate with answering questions.  Resp: breathing comfortably GI: soft, non distended Male genitalia: left groin WITHOUT evidence of recurrence.    Lab Results:  Recent Labs    04/07/21 1400 04/08/21 0416  WBC 2.9* 4.5  HGB 11.8* 10.3*  HCT 35.5* 31.6*  PLT 127* 115*   BMET Recent Labs    04/07/21 1400 04/08/21 0416  NA 137 137  K 4.0 4.6  CL 100 103  CO2 30 30  GLUCOSE 143* 88  BUN 18 16  CREATININE 0.70 0.73  CALCIUM 9.1 8.7*   PT/INR Recent Labs    04/08/21 0416  LABPROT 15.0  INR 1.2   ABG No results for input(s): PHART, HCO3 in the last 72 hours.  Invalid input(s): PCO2, PO2  Studies/Results: CT ABDOMEN PELVIS W CONTRAST  Result Date: 04/07/2021 CLINICAL DATA:  A 88 year old male presents for evaluation of constipation and emesis, concern for bowel obstruction, presenting with LEFT inguinal mass. EXAM: CT ABDOMEN AND PELVIS WITH CONTRAST TECHNIQUE: Multidetector CT imaging of the abdomen and pelvis was performed using the standard protocol following bolus administration of intravenous contrast. RADIATION DOSE REDUCTION: This exam was performed according to the departmental dose-optimization program which includes automated exposure control, adjustment of the mA and/or kV according to patient size and/or use of iterative  reconstruction technique. CONTRAST:  19mL OMNIPAQUE IOHEXOL 300 MG/ML  SOLN COMPARISON:  Comparison made with November 17, 2017. FINDINGS: Lower chest: Lung bases are clear. Heart size is enlarged. Signs of coronary artery disease. Thinning of the LEFT ventricular apex as before. Hepatobiliary: Liver without focal lesion. Portal vein is patent. Gallbladder is contracted. There is mild intrahepatic biliary duct distension that is new from previous imaging. Pancreas: Mild pancreatic atrophy. No substantial pancreatic duct distension. Spleen: Normal. Adrenals/Urinary Tract: Adrenal glands are normal. RIGHT renal cysts. Nephrolithiasis with calculi in the interpolar RIGHT kidney and lower pole of the RIGHT kidney. Interpolar calculus similar to the prior study. Lower pole calculus new compared to previous imaging. No suspicious renal lesion. No perivesical stranding. No hydronephrosis. Stomach/Bowel: Abundant stool is present in the colon. No surrounding stranding. The appendix is normal. Recurrent LEFT inguinal hernia contains a focally dilated small bowel loops with mild surrounding stranding. Abdominal wall defect best seen on the sagittal images, image 112/5) defect approximately 5 mm. Diffuse bowel dilation upstream from the herniated bowel. Hernia likely occurs at the level of the proximal ileum. Bowel can be seen crossing from RIGHT to LEFT in the abdomen towards the ileocecal valve. This bowel is decompressed/collapsed in the RIGHT upper quadrant. Distortion of mesenteric vessels likely due to bowel distension and presence of hernia in the LEFT lower quadrant distorting bowel loops. Bowel upstream from the hernia is dilated up to 3 cm. No signs of pneumatosis. Colonic diverticulosis. Vascular/Lymphatic: Aortic atherosclerosis. No sign of aneurysm. Smooth contour of the IVC. There is  no gastrohepatic or hepatoduodenal ligament lymphadenopathy. No retroperitoneal or mesenteric lymphadenopathy. No pelvic sidewall  lymphadenopathy. Reproductive: Mild prostatomegaly with heterogeneity. Other: Small volume fluid in the abdomen along the RIGHT pericolic gutter. Diffuse mild mesenteric edema, difficult to quantify given the lack of mesenteric fat. No pneumoperitoneum. Musculoskeletal: Spinal degenerative changes. No acute or destructive bone process. IMPRESSION: 1. Recurrent LEFT inguinal hernia leading to small bowel obstruction either high-grade partial or complete small bowel obstruction in the LEFT inguinal region. Defect in the abdominal wall fascia is narrow, based on this the hernia is at high-risk for strangulation or incarceration. Surgical consultation is suggested. 2. Small volume fluid in the abdomen along the RIGHT pericolic gutter. No pneumatosis or free air currently. 3. Diffuse mild mesenteric edema, difficult to quantify given the lack of mesenteric fat. 4. Mild intra and extrahepatic biliary duct distension that is new from previous imaging. Correlation with hepatic function tests may be helpful to determine whether follow-up or further assessment may be warranted. 5. Nonobstructing right nephrolithiasis. 6. Colonic diverticulosis without evidence of acute diverticulitis. 7. Aortic atherosclerosis. Aortic Atherosclerosis (ICD10-I70.0). Electronically Signed   By: Zetta Bills M.D.   On: 04/07/2021 16:57    Anti-infectives: Anti-infectives (From admission, onward)    None       Assessment/Plan: Incarcerated recurrent left inguinal hernia- reduced SBO- no n/v, some flatus. Will advance to clears.   Constipation.   Clear liquids Check abdominal x ray.  Will call nephew again to see if he is available.   No plans for surgery today, but still may need if this pops out again.     LOS: 1 day    Milus Height, MD FACS Surgical Oncology, General Surgery, Trauma and [place] [facility], [place] for weekday/non holidays Check [email or web address] for coverage  night/weekend/holidays  Do not use SecureChat as it is not reliable for timely patient care.

## 2021-04-08 NOTE — Hospital Course (Addendum)
88 yo with hx bilateral inguinal hernia s/p repair (left 1962, right 1980), BPH, T2DM, GERD and multiple other medical issues who presented with abdominal pain and was found to have incarcerated L inguinal hernia with SBO.  Hernia reduced in ED by general surgery.    He's gradually improving, passing gas, but hasn't had bowel movement yet.  He's not interested in elective surgical repair at this time  See below for additional details

## 2021-04-08 NOTE — Assessment & Plan Note (Addendum)
CT showing recurrent L inguinal hernia with SBO (high grade partial or complete SBO) Inguinal hernia reduced by surgery in ED Plain film with nonobstructive pattern of bowel gas, large stool burden Appreciate surgery assistance - patient wants to avoid surgery if possible, advanced to [facility] (hopefully continue to advance tomorrow), will order enema for him Cardiology was consulted for surgical clearance - awaiting EKG (brady), echo (EF 25-50%, grade II diastolic dysfunction) - no current plans for surgery now

## 2021-04-08 NOTE — Progress Notes (Signed)
At patient's request, I discussed the situation with his nephew, Thomasene Mohair (primary emergency contact).  His nephew is in bed with back spasms and is not able to visit for several days.  He understands the situation and affirms his uncle's sharp mental status and ability to make his own health care decisions, but is in support of surgery if his uncle makes that decision.    Ultimately, decision for surgery and timing will be up to Dr. Johney Maine tomorrow.  Recommend cards risk stratification and palliative care input.    Await plavix to clear as well.    Awaiting return of bowel function as well after SBO from [facility].

## 2021-04-08 NOTE — Progress Notes (Signed)
Hypoglycemic Event  CBG: 53      Treatment: D50 50 mL (25 gm)  Symptoms: None  Follow-up CBG: Time:1816 CBG Result:161  Possible Reasons for Event: Inadequate meal intake  Comments/MD notified: Notified Dr. Florene Glen, Awaiting new fluid orders.    Evalee Jefferson D

## 2021-04-08 NOTE — Assessment & Plan Note (Signed)
SSI

## 2021-04-08 NOTE — Assessment & Plan Note (Addendum)
Downtrending, follow

## 2021-04-08 NOTE — Assessment & Plan Note (Signed)
Reduced in ED by general surgery Appreciate further recs

## 2021-04-08 NOTE — Assessment & Plan Note (Signed)
Improved with IVF ?

## 2021-04-08 NOTE — Progress Notes (Signed)
PROGRESS NOTE    [name] [name]  RDE:[id number] DOB: January 21, 1929 DOA: 04/07/2021 PCP: Antony Contras, MD  Chief Complaint  Patient presents with   Groin Pain    Brief Narrative:  88 yo with hx bilateral inguinal hernia s/p repair (left 1962, right 1980), BPH, T2DM, GERD and multiple other medical issues who presented with abdominal pain and was found to have incarcerated L inguinal hernia with SBO.  Hernia reduced in ED by general surgery.    See below for additional details    Assessment & Plan:   Principal Problem:   SBO (small bowel obstruction) (HCC) Active Problems:   Left inguinal hernia   Diabetes mellitus without complication (HCC)   Dehydration   Acute prerenal azotemia   Thrombocytopenia (HCC)   BPH (benign prostatic hyperplasia)   Biliary anomaly   Assessment and Plan: * SBO (small bowel obstruction) ([place])- (present on admission) CT showing recurrent L inguinal hernia with SBO (high grade partial or complete SBO) Inguinal hernia reduced by surgery in ED Currently on CLD Plain film with nonobstructive pattern of bowel gas, large stool burden Appreciate surgery assistance  Left inguinal hernia Reduced in ED by general surgery Appreciate further recs  Diabetes mellitus without complication ([place]) SSI  Thrombocytopenia ([place]) Noted, follow  Dehydration- (present on admission) Improved with IVF  BPH (benign prostatic hyperplasia)- (present on admission) BPH  Biliary anomaly CT with mild intra and extrahepatic biliary duct distension LFT's wnl Will follow RUQ US   DVT prophylaxis: SCD Code Status: full Family Communication: none Disposition:   Status is: Inpatient Remains inpatient appropriate because: need for surgery eval   Consultants:  General surgery  Procedures:  none  Antimicrobials:  Anti-infectives (From admission, onward)    None       Subjective: No complaints at this time  Objective: Vitals:   04/07/21 2330 04/08/21  0026 04/08/21 0517 04/08/21 1329  BP: 117/65 112/60 (!) 98/52 (!) 98/56  Pulse: 73 71 (!) 59 60  Resp:  17 14 16   Temp:  97.8 F (36.6 C) 98.2 F (36.8 C) 97.6 F (36.4 C)  TempSrc:  Oral Oral   SpO2: 96% 96% 95% 95%    Intake/Output Summary (Last 24 hours) at 04/08/2021 1451 Last data filed at 04/08/2021 1400 Gross per 24 hour  Intake 1867.58 ml  Output 600 ml  Net 1267.58 ml   There were no vitals filed for this visit.  Examination:  General exam: Appears calm and comfortable  Respiratory system: unlabored Cardiovascular system: RRR Gastrointestinal system: Abdomen is nondistended, soft and nontender.  Central nervous system: Alert and oriented. No focal neurological deficits. Extremities: no LEE Skin: No rashes, lesions or ulcers Psychiatry: Judgement and insight appear normal. Mood & affect appropriate.     Data Reviewed: I have personally reviewed following labs and imaging studies  CBC: Recent Labs  Lab 04/07/21 1400 04/08/21 0416  WBC 2.9* 4.5  NEUTROABS 2.2 3.5  HGB 11.8* 10.3*  HCT 35.5* 31.6*  MCV 94.2 95.8  PLT 127* 115*    Basic Metabolic Panel: Recent Labs  Lab 04/07/21 1400 04/08/21 0416  NA 137 137  K 4.0 4.6  CL 100 103  CO2 30 30  GLUCOSE 143* 88  BUN 18 16  CREATININE 0.70 0.73  CALCIUM 9.1 8.7*  MG  --  2.0   2.1    GFR: CrCl cannot be calculated (Unknown ideal weight.).  Liver Function Tests: Recent Labs  Lab 04/07/21 1400 04/08/21 0416  AST 17  18  ALT 11 11  ALKPHOS 64 53  BILITOT 0.7 0.5  PROT 6.8 5.9*  ALBUMIN 4.0 3.4*    CBG: Recent Labs  Lab 04/08/21 0213 04/08/21 0543 04/08/21 1152  GLUCAP 116* 72 75     Recent Results (from the past 240 hour(s))  Resp Panel by RT-PCR (Flu Gearline Spilman&B, Covid) Nasopharyngeal Swab     Status: None   Collection Time: 04/07/21  9:37 PM   Specimen: Nasopharyngeal Swab; Nasopharyngeal(NP) swabs in vial transport medium  Result Value Ref Range Status   SARS Coronavirus 2 by RT  PCR NEGATIVE NEGATIVE Final    Comment: (NOTE) SARS-CoV-2 target nucleic acids are NOT DETECTED.  The SARS-CoV-2 RNA is generally detectable in upper respiratory specimens during the acute phase of infection. The lowest concentration of SARS-CoV-2 viral copies this assay can detect is 138 copies/mL. Stacia Feazell negative result does not preclude SARS-Cov-2 infection and should not be used as the sole basis for treatment or other patient management decisions. Krystall Kruckenberg negative result may occur with  improper specimen collection/handling, submission of specimen other than nasopharyngeal swab, presence of viral mutation(s) within the areas targeted by this assay, and inadequate number of viral copies(<138 copies/mL). Livvy Spilman negative result must be combined with clinical observations, patient history, and epidemiological information. The expected result is Negative.  Fact Sheet for Patients:  EntrepreneurPulse.com.au  Fact Sheet for Healthcare Providers:  IncredibleEmployment.be  This test is no t yet approved or cleared by the [place] FDA and  has been authorized for detection and/or diagnosis of SARS-CoV-2 by FDA under an Emergency Use Authorization (EUA). This EUA will remain  in effect (meaning this test can be used) for the duration of the COVID-19 declaration under Section 564(b)(1) of the Act, 21 U.S.C.section 360bbb-3(b)(1), unless the authorization is terminated  or revoked sooner.       Influenza Herrick Hartog by PCR NEGATIVE NEGATIVE Final   Influenza B by PCR NEGATIVE NEGATIVE Final    Comment: (NOTE) The Xpert Xpress SARS-CoV-2/FLU/RSV plus assay is intended as an aid in the diagnosis of influenza from Nasopharyngeal swab specimens and should not be used as Ashaz Robling sole basis for treatment. Nasal washings and aspirates are unacceptable for Xpert Xpress SARS-CoV-2/FLU/RSV testing.  Fact Sheet for Patients: EntrepreneurPulse.com.au  Fact Sheet for  Healthcare Providers: IncredibleEmployment.be  This test is not yet approved or cleared by the [place] FDA and has been authorized for detection and/or diagnosis of SARS-CoV-2 by FDA under an Emergency Use Authorization (EUA). This EUA will remain in effect (meaning this test can be used) for the duration of the COVID-19 declaration under Section 564(b)(1) of the Act, 21 U.S.C. section 360bbb-3(b)(1), unless the authorization is terminated or revoked.  Performed at [facility], [place] [address] [place], [place] [address]          Radiology Studies: CT ABDOMEN PELVIS W CONTRAST  Result Date: 04/07/2021 CLINICAL DATA:  Demauri Advincula 88 year old male presents for evaluation of constipation and emesis, concern for bowel obstruction, presenting with LEFT inguinal mass. EXAM: CT ABDOMEN AND PELVIS WITH CONTRAST TECHNIQUE: Multidetector CT imaging of the abdomen and pelvis was performed using the standard protocol following bolus administration of intravenous contrast. RADIATION DOSE REDUCTION: This exam was performed according to the departmental dose-optimization program which includes automated exposure control, adjustment of the mA and/or kV according to patient size and/or use of iterative reconstruction technique. CONTRAST:  152mL OMNIPAQUE IOHEXOL 300 MG/ML  SOLN COMPARISON:  Comparison made with November 17, 2017. FINDINGS: Lower  chest: Lung bases are clear. Heart size is enlarged. Signs of coronary artery disease. Thinning of the LEFT ventricular apex as before. Hepatobiliary: Liver without focal lesion. Portal vein is patent. Gallbladder is contracted. There is mild intrahepatic biliary duct distension that is new from previous imaging. Pancreas: Mild pancreatic atrophy. No substantial pancreatic duct distension. Spleen: Normal. Adrenals/Urinary Tract: Adrenal glands are normal. RIGHT renal cysts. Nephrolithiasis with calculi in the interpolar RIGHT kidney  and lower pole of the RIGHT kidney. Interpolar calculus similar to the prior study. Lower pole calculus new compared to previous imaging. No suspicious renal lesion. No perivesical stranding. No hydronephrosis. Stomach/Bowel: Abundant stool is present in the colon. No surrounding stranding. The appendix is normal. Recurrent LEFT inguinal hernia contains Adreonna Yontz focally dilated small bowel loops with mild surrounding stranding. Abdominal wall defect best seen on the sagittal images, image 112/5) defect approximately 5 mm. Diffuse bowel dilation upstream from the herniated bowel. Hernia likely occurs at the level of the proximal ileum. Bowel can be seen crossing from RIGHT to LEFT in the abdomen towards the ileocecal valve. This bowel is decompressed/collapsed in the RIGHT upper quadrant. Distortion of mesenteric vessels likely due to bowel distension and presence of hernia in the LEFT lower quadrant distorting bowel loops. Bowel upstream from the hernia is dilated up to 3 cm. No signs of pneumatosis. Colonic diverticulosis. Vascular/Lymphatic: Aortic atherosclerosis. No sign of aneurysm. Smooth contour of the IVC. There is no gastrohepatic or hepatoduodenal ligament lymphadenopathy. No retroperitoneal or mesenteric lymphadenopathy. No pelvic sidewall lymphadenopathy. Reproductive: Mild prostatomegaly with heterogeneity. Other: Small volume fluid in the abdomen along the RIGHT pericolic gutter. Diffuse mild mesenteric edema, difficult to quantify given the lack of mesenteric fat. No pneumoperitoneum. Musculoskeletal: Spinal degenerative changes. No acute or destructive bone process. IMPRESSION: 1. Recurrent LEFT inguinal hernia leading to small bowel obstruction either high-grade partial or complete small bowel obstruction in the LEFT inguinal region. Defect in the abdominal wall fascia is narrow, based on this the hernia is at high-risk for strangulation or incarceration. Surgical consultation is suggested. 2. Small volume  fluid in the abdomen along the RIGHT pericolic gutter. No pneumatosis or free air currently. 3. Diffuse mild mesenteric edema, difficult to quantify given the lack of mesenteric fat. 4. Mild intra and extrahepatic biliary duct distension that is new from previous imaging. Correlation with hepatic function tests may be helpful to determine whether follow-up or further assessment may be warranted. 5. Nonobstructing right nephrolithiasis. 6. Colonic diverticulosis without evidence of acute diverticulitis. 7. Aortic atherosclerosis. Aortic Atherosclerosis (ICD10-I70.0). Electronically Signed   By: Zetta Bills M.D.   On: 04/07/2021 16:57   DG Abd Portable 1V  Result Date: 04/08/2021 CLINICAL DATA:  Small bowel obstruction EXAM: PORTABLE ABDOMEN - 1 VIEW COMPARISON:  CT abdomen pelvis, 04/07/2021 FINDINGS: Nonobstructive pattern of bowel gas. Gas-filled, nondistended loops of small bowel in the central abdomen measuring up to 2.8 cm in caliber. Large burden of stool throughout the colon and rectum, with gas and stool present to the rectum. Excreted contrast in the urinary bladder. No obvious free air on single supine radiograph. IMPRESSION: 1. Nonobstructive pattern of bowel gas. Gas-filled, nondistended loops of small bowel in the central abdomen measuring up to 2.8 cm in caliber. 2. Large burden of stool throughout the colon and rectum, with gas and stool present to the rectum. Electronically Signed   By: Delanna Ahmadi M.D.   On: 04/08/2021 10:33        Scheduled Meds:  insulin aspart  0-6 Units Subcutaneous Q6H   polyethylene glycol  17 g Oral BID   Continuous Infusions:  lactated ringers 75 mL/hr at 04/08/21 0215     LOS: 1 day    Time spent: over 30 min    Fayrene Helper, MD [facility]   To contact the attending provider between 7A-7P or the covering provider during after hours 7P-7A, please log into the web site [email or web address] and access using universal [place] password  for that web site. If you do not have the password, please call the hospital operator.  04/08/2021, 2:51 PM

## 2021-04-09 ENCOUNTER — Other Ambulatory Visit: Payer: Self-pay

## 2021-04-09 ENCOUNTER — Encounter (HOSPITAL_COMMUNITY): Payer: Self-pay | Admitting: Internal Medicine

## 2021-04-09 ENCOUNTER — Inpatient Hospital Stay (HOSPITAL_COMMUNITY): Payer: Medicare Other

## 2021-04-09 DIAGNOSIS — K56609 Unspecified intestinal obstruction, unspecified as to partial versus complete obstruction: Secondary | ICD-10-CM | POA: Diagnosis not present

## 2021-04-09 DIAGNOSIS — I350 Nonrheumatic aortic (valve) stenosis: Secondary | ICD-10-CM

## 2021-04-09 DIAGNOSIS — Z01810 Encounter for preprocedural cardiovascular examination: Secondary | ICD-10-CM

## 2021-04-09 LAB — ECHOCARDIOGRAM COMPLETE
AR max vel: 1.65 cm2
AV Area VTI: 1.68 cm2
AV Area mean vel: 1.5 cm2
AV Mean grad: 11.5 mmHg
AV Peak grad: 19 mmHg
Ao pk vel: 2.18 m/s
Area-P 1/2: 4.06 cm2
Calc EF: 42.7 %
Height: 70 in
MV M vel: 4.83 m/s
MV Peak grad: 93.3 mmHg
MV VTI: 2.45 cm2
P 1/2 time: 555 msec
S' Lateral: 4 cm
Single Plane A2C EF: 38.7 %
Single Plane A4C EF: 44.4 %
Weight: 2892.44 oz

## 2021-04-09 LAB — GLUCOSE, CAPILLARY
Glucose-Capillary: 79 mg/dL (ref 70–99)
Glucose-Capillary: 80 mg/dL (ref 70–99)
Glucose-Capillary: 127 mg/dL — ABNORMAL HIGH (ref 70–99)

## 2021-04-09 LAB — CBC WITH DIFFERENTIAL/PLATELET
Abs Immature Granulocytes: 0.01 10*3/uL (ref 0.00–0.07)
Basophils Absolute: 0 10*3/uL (ref 0.0–0.1)
Basophils Relative: 1 %
Eosinophils Absolute: 0.1 10*3/uL (ref 0.0–0.5)
Eosinophils Relative: 2 %
HCT: 31 % — ABNORMAL LOW (ref 39.0–52.0)
Hemoglobin: 10.1 g/dL — ABNORMAL LOW (ref 13.0–17.0)
Immature Granulocytes: 0 %
Lymphocytes Relative: 19 %
Lymphs Abs: 0.7 10*3/uL (ref 0.7–4.0)
MCH: 31.5 pg (ref 26.0–34.0)
MCHC: 32.6 g/dL (ref 30.0–36.0)
MCV: 96.6 fL (ref 80.0–100.0)
Monocytes Absolute: 0.3 10*3/uL (ref 0.1–1.0)
Monocytes Relative: 9 %
Neutro Abs: 2.4 10*3/uL (ref 1.7–7.7)
Neutrophils Relative %: 69 %
Platelets: 105 10*3/uL — ABNORMAL LOW (ref 150–400)
RBC: 3.21 MIL/uL — ABNORMAL LOW (ref 4.22–5.81)
RDW: 15.7 % — ABNORMAL HIGH (ref 11.5–15.5)
WBC: 3.5 10*3/uL — ABNORMAL LOW (ref 4.0–10.5)
nRBC: 0 % (ref 0.0–0.2)

## 2021-04-09 LAB — COMPREHENSIVE METABOLIC PANEL
ALT: 10 U/L (ref 0–44)
AST: 24 U/L (ref 15–41)
Albumin: 3.2 g/dL — ABNORMAL LOW (ref 3.5–5.0)
Alkaline Phosphatase: 47 U/L (ref 38–126)
Anion gap: 3 — ABNORMAL LOW (ref 5–15)
BUN: 15 mg/dL (ref 8–23)
CO2: 29 mmol/L (ref 22–32)
Calcium: 8.4 mg/dL — ABNORMAL LOW (ref 8.9–10.3)
Chloride: 104 mmol/L (ref 98–111)
Creatinine, Ser: 0.55 mg/dL — ABNORMAL LOW (ref 0.61–1.24)
GFR, Estimated: >60 mL/min (ref >60)
Glucose, Bld: 77 mg/dL (ref 70–99)
Potassium: 4 mmol/L (ref 3.5–5.1)
Sodium: 136 mmol/L (ref 135–145)
Total Bilirubin: 1 mg/dL (ref 0.3–1.2)
Total Protein: 5.3 g/dL — ABNORMAL LOW (ref 6.5–8.1)

## 2021-04-09 LAB — MAGNESIUM: Magnesium: 1.9 mg/dL (ref 1.7–2.4)

## 2021-04-09 LAB — PHOSPHORUS: Phosphorus: 3.1 mg/dL (ref 2.5–4.6)

## 2021-04-09 MED ORDER — ENSURE ENLIVE PO LIQD
237.0000 mL | Freq: Three times a day (TID) | ORAL | Status: DC
  Administered 2021-04-09 – 2021-04-10 (×4): 237 mL via ORAL

## 2021-04-09 MED ORDER — TAMSULOSIN HCL 0.4 MG PO CAPS
0.4000 mg | ORAL_CAPSULE | Freq: Every day | ORAL | Status: DC
  Administered 2021-04-09 – 2021-04-10 (×2): 0.4 mg via ORAL
  Filled 2021-04-09 (×2): qty 1

## 2021-04-09 NOTE — Progress Notes (Incomplete Revision)
PROGRESS NOTE    [name] [name]  AJG:[id number] DOB: [birth year] DOA: 04/07/2021 PCP: Antony Contras, MD  Chief Complaint  Patient presents with   Groin Pain    Brief Narrative:  88 yo with hx bilateral inguinal hernia s/p repair (left 1962, right 1980), BPH, T2DM, GERD and multiple other medical issues who presented with abdominal pain and was found to have incarcerated L inguinal hernia with SBO.  Hernia reduced in ED by general surgery.    See below for additional details   Assessment & Plan:   Principal Problem:   SBO (small bowel obstruction) (HCC) Active Problems:   Coronary artery disease   Left inguinal hernia   Diabetes mellitus without complication (HCC)   Acute urinary retention   Dehydration   Acute prerenal azotemia   Thrombocytopenia (HCC)   BPH (benign prostatic hyperplasia)   Biliary anomaly   Assessment and Plan: * SBO (small bowel obstruction) (HCC)- (present on admission) CT showing recurrent L inguinal hernia with SBO (high grade partial or complete SBO) Inguinal hernia reduced by surgery in ED Plain film with nonobstructive pattern of bowel gas, large stool burden Appreciate surgery assistance - patient wants to avoid surgery if possible, advanced to [place] Cardiology was consulted for surgical clearance - awaiting EKG, echo  Left inguinal hernia Reduced in ED by general surgery Appreciate further recs  Coronary artery disease Hx stent Holding plavix for now  Acute urinary retention Foley placed 2/12 flomax Consider tov 2/14  Diabetes mellitus without complication ([place]) SSI  Thrombocytopenia ([employer]) Downtrending, follow  Dehydration- (present on admission) Improved with IVF  BPH (benign prostatic hyperplasia)- (present on admission) BPH  Biliary anomaly CT with mild intra and extrahepatic biliary duct distension LFT's wnl Will follow RUQ [place] -> prominent proximal CBD, no significant dilation of intrahepatic bile duct LFT's wnl, follow  outpatient   DVT prophylaxis: SCD Code Status: full Family Communication: none Disposition:   Status is: Inpatient Remains inpatient appropriate because: need for surgery eval   Consultants:  General surgery  Procedures:  none  Antimicrobials:  Anti-infectives (From admission, onward)    None       Subjective: Asking when he could go home Discussed will need to make sure he's tolerating diet  Objective: Vitals:   04/08/21 2055 04/09/21 0514 04/09/21 0700 04/09/21 1438  BP: (!) 95/56 (!) 101/56  (!) 108/58  Pulse: (!) 54 (!) 52  (!) 51  Resp: 16 16  18   Temp: 97.7 F (36.5 C) 98.4 F (36.9 C)  98.2 F (36.8 C)  TempSrc: Oral Oral  Oral  SpO2: 93% 98%  94%  Weight:   82 kg   Height:   5\' 10"  (1.778 m)     Intake/Output Summary (Last 24 hours) at 04/09/2021 1621 Last data filed at 04/09/2021 1500 Gross per 24 hour  Intake 1949.26 ml  Output 1920 ml  Net 29.26 ml   Filed Weights   04/09/21 0700  Weight: 82 kg    Examination:  General: No acute distress. Cardiovascular: RRR Lungs: unlabored Abdomen: Soft, nontender, nondistended  Neurological: Alert and oriented 3. Moves all extremities 4. Cranial nerves II through XII grossly intact. Skin: Warm and dry. No rashes or lesions. Extremities: No clubbing or cyanosis. No edema.    Data Reviewed: I have personally reviewed following labs and imaging studies  CBC: Recent Labs  Lab 04/07/21 1400 04/08/21 0416 04/09/21 0354  WBC 2.9* 4.5 3.5*  NEUTROABS 2.2 3.5 2.4  HGB 11.8* 10.3* 10.1*  HCT 35.5* 31.6* 31.0*  MCV 94.2 95.8 96.6  PLT 127* 115* 105*    Basic Metabolic Panel: Recent Labs  Lab 04/07/21 1400 04/08/21 0416 04/09/21 0354  NA 137 137 136  K 4.0 4.6 4.0  CL 100 103 104  CO2 30 30 29   GLUCOSE 143* 88 77  BUN 18 16 15   CREATININE 0.70 0.73 0.55*  CALCIUM 9.1 8.7* 8.4*  MG  --  2.0   2.1 1.9  PHOS  --   --  3.1    GFR: Estimated Creatinine Clearance: 63.4 mL/min (Orla Estrin) (by  C-G formula based on SCr of 0.55 mg/dL (L)).  Liver Function Tests: Recent Labs  Lab 04/07/21 1400 04/08/21 0416 04/09/21 0354  AST 17 18 24   ALT 11 11 10   ALKPHOS 64 53 47  BILITOT 0.7 0.5 1.0  PROT 6.8 5.9* 5.3*  ALBUMIN 4.0 3.4* 3.2*    CBG: Recent Labs  Lab 04/08/21 1747 04/08/21 1817 04/08/21 2331 04/09/21 0612 04/09/21 1151  GLUCAP 53* 161* 87 79 80     Recent Results (from the past 240 hour(s))  Resp Panel by RT-PCR (Flu Raeanna Soberanes&B, Covid) Nasopharyngeal Swab     Status: None   Collection Time: 04/07/21  9:37 PM   Specimen: Nasopharyngeal Swab; Nasopharyngeal(NP) swabs in vial transport medium  Result Value Ref Range Status   SARS Coronavirus 2 by RT PCR NEGATIVE NEGATIVE Final    Comment: (NOTE) SARS-CoV-2 target nucleic acids are NOT DETECTED.  The SARS-CoV-2 RNA is generally detectable in upper respiratory specimens during the acute phase of infection. The lowest concentration of SARS-CoV-2 viral copies this assay can detect is 138 copies/mL. Linsay Vogt negative result does not preclude SARS-Cov-2 infection and should not be used as the sole basis for treatment or other patient management decisions. Rocsi Hazelbaker negative result may occur with  improper specimen collection/handling, submission of specimen other than nasopharyngeal swab, presence of viral mutation(s) within the areas targeted by this assay, and inadequate number of viral copies(<138 copies/mL). Mavi Un negative result must be combined with clinical observations, patient history, and epidemiological information. The expected result is Negative.  Fact Sheet for Patients:  EntrepreneurPulse.com.au  Fact Sheet for Healthcare Providers:  IncredibleEmployment.be  This test is no t yet approved or cleared by the [place] FDA and  has been authorized for detection and/or diagnosis of SARS-CoV-2 by FDA under an Emergency Use Authorization (EUA). This EUA will remain  in effect (meaning  this test can be used) for the duration of the COVID-19 declaration under Section 564(b)(1) of the Act, 21 U.S.C.section 360bbb-3(b)(1), unless the authorization is terminated  or revoked sooner.       Influenza Latonda Larrivee by PCR NEGATIVE NEGATIVE Final   Influenza B by PCR NEGATIVE NEGATIVE Final    Comment: (NOTE) The Xpert Xpress SARS-CoV-2/FLU/RSV plus assay is intended as an aid in the diagnosis of influenza from Nasopharyngeal swab specimens and should not be used as Birch Farino sole basis for treatment. Nasal washings and aspirates are unacceptable for Xpert Xpress SARS-CoV-2/FLU/RSV testing.  Fact Sheet for Patients: EntrepreneurPulse.com.au  Fact Sheet for Healthcare Providers: IncredibleEmployment.be  This test is not yet approved or cleared by the [place] FDA and has been authorized for detection and/or diagnosis of SARS-CoV-2 by FDA under an Emergency Use Authorization (EUA). This EUA will remain in effect (meaning this test can be used) for the duration of the COVID-19 declaration under Section 564(b)(1) of the Act, 21 U.S.C. section 360bbb-3(b)(1), unless the authorization is terminated or  revoked.  Performed at [facility], [place] [address], [place], [place] [address]          Radiology Studies: DG Abd Portable 1V  Result Date: 04/08/2021 CLINICAL DATA:  Small bowel obstruction EXAM: PORTABLE ABDOMEN - 1 VIEW COMPARISON:  CT abdomen pelvis, 04/07/2021 FINDINGS: Nonobstructive pattern of bowel gas. Gas-filled, nondistended loops of small bowel in the central abdomen measuring up to 2.8 cm in caliber. Large burden of stool throughout the colon and rectum, with gas and stool present to the rectum. Excreted contrast in the urinary bladder. No obvious free air on single supine radiograph. IMPRESSION: 1. Nonobstructive pattern of bowel gas. Gas-filled, nondistended loops of small bowel in the central abdomen measuring up to  2.8 cm in caliber. 2. Large burden of stool throughout the colon and rectum, with gas and stool present to the rectum. Electronically Signed   By: Delanna Ahmadi M.D.   On: 04/08/2021 10:33   US Abdomen Limited RUQ (LIVER/GB)  Result Date: 04/08/2021 CLINICAL DATA:  Bile duct dilation seen in the previous CT EXAM: ULTRASOUND ABDOMEN LIMITED RIGHT UPPER QUADRANT COMPARISON:  CT done on 04/07/2021 FINDINGS: Gallbladder: There are echogenic foci in the lumen of nondistended gallbladder suggesting gallstones. Technologist did not observe any tenderness over the gallbladder. There is no fluid around the gallbladder. Common bile duct: Proximal common bile duct measures 7.3 mm in diameter without definite intrahepatic duct dilation. Distal common bile duct is not adequately visualized for evaluation. Liver: There is coarsening of echoes in the liver. There is possible mild nodularity in the liver surface. No focal abnormality is seen. Portal vein is patent on color Doppler imaging with normal direction of blood flow towards the liver. Other: None. IMPRESSION: Gallbladder stones. There are no sonographic signs of acute cholecystitis. Proximal common bile duct is prominent measuring 7.3 mm. There is no significant dilation of intrahepatic bile ducts. Distal common bile duct is not adequately visualized limiting evaluation for choledocholithiasis. There is coarsening of echoes in the liver which may be due to fatty infiltration or cirrhosis. No focal abnormality is seen in the visualized portions of liver. Electronically Signed   By: Elmer Picker M.D.   On: 04/08/2021 16:45        Scheduled Meds:  Chlorhexidine Gluconate Cloth  6 each Topical Daily   feeding supplement  237 mL Oral TID WC   insulin aspart  0-6 Units Subcutaneous Q6H   polyethylene glycol  17 g Oral BID   tamsulosin  0.4 mg Oral Daily   Continuous Infusions:  dextrose 5% lactated ringers 75 mL/hr at 04/08/21 2043     LOS: 2 days     Time spent: over 30 min    Fayrene Helper, MD [facility]   To contact the attending provider between 7A-7P or the covering provider during after hours 7P-7A, please log into the web site [email or web address] and access using universal [place] password for that web site. If you do not have the password, please call the hospital operator.  04/09/2021, 4:21 PM

## 2021-04-09 NOTE — Consult Note (Addendum)
Cardiology Consultation:   Patient ID: [name] [name] MRN: [id number]; DOB: Aug 16, 1928  Admit date: 04/07/2021 Date of Consult: 04/09/2021  PCP:  Antony Contras, MD   [facility] Providers Cardiologist:  None        Patient Profile:   [name] [name] is a 88 y.o. male with a hx of significant CAD with hx of stent to [facility] with DES 11/25/13 and DES to distal and proximal LCX with 2 overlapping stents EF at that time with EF 20%  who is being seen 04/09/2021 for the evaluation of cardiac risk stratification for possible hernia surgery at the request of Dr Dorris Fetch and Dr. Florene Glen.  History of Present Illness:   [name] with hx of significant CAD distal LM with 10-40% stenosis, LCX was 90-95 but did have DES overlapping in 2015.  LAD gives origin to a small to moderate sized diagonal-1.  There was a 70-80% stenosis followed by total occlusion just after the origin of the D1. The mid to distal segment of the LAD is severely calcified. Distal LAD has collaterals from the right coronary artery.  And RCA with stenosis having DES stent placed on 2015.  EF at that time was 20%.  Pt has not seen cardiology since that time per pt.  I was recommended for lifelong plavix and he has been taking. No other cardiac meds.  Pt is followed by palliative care   Pt presented to ER from assisted living for LLQ pain and difficulty urinating.  CT of abd pelvis with recurrent lt inguinal hernia leading to sm. bowel obstruction.   Surgery has evaluated and plan for medical management but asked for cardiac eval in case surgery is needed.    Labs today WBC 3.5 Hgb 10.1 plts 105 1 yr ago hgb 12.6 and plts 2 yrs ago 225  Na 136, K+ 4.0  Cr 0.55 Mg+ 1.9   EKG:  The EKG was personally reviewed and demonstrates:  have ordered stat  none since 2019.   Telemetry:  Telemetry was personally reviewed and demonstrates:  none   Pt denies any chest pain or SOB.  His MD at his facility follows his heart and meds.    BP 101/56 P 52  and afebrile  bp has been upper 74J systolic.    Past Medical History:  Diagnosis Date   Anxiety    Asthma    "all my life"   BPH (benign prostatic hyperplasia)    "even now; after my OR" (12/28/2013)   CHF (congestive heart failure) ([place])    Chronic bronchitis ([place])    "often"   Coronary artery disease    Depression    Diabetes mellitus without complication (HCC)    Type II   Diverticulosis of colon (without mention of hemorrhage)    GERD (gastroesophageal reflux disease)    Hiatal hernia    High cholesterol    Hypertension    Kidney stone    "I've had 7 over the years" (12/28/2013)   Pneumonia 1930's   Rhinitis     Past Surgical History:  Procedure Laterality Date   COLONOSCOPY W/ BIOPSIES AND POLYPECTOMY     CORONARY ANGIOPLASTY WITH STENT PLACEMENT  11/25/2013; 12/28/2013   "1; ?2"   CYSTOSCOPY W/ LITHOLAPAXY / EHL  X 2   INGUINAL HERNIA REPAIR Left 1962   INGUINAL HERNIA REPAIR Right 1980   LEFT HEART CATHETERIZATION WITH CORONARY ANGIOGRAM N/A 11/25/2013   Procedure: LEFT HEART CATHETERIZATION WITH CORONARY ANGIOGRAM;  Surgeon: Cammy Brochure  Carlynn Herald, MD;  Location: [facility] CATH LAB;  Service: Cardiovascular;  Laterality: N/A;   PERCUTANEOUS CORONARY STENT INTERVENTION (PCI-S)  11/25/2013   Procedure: PERCUTANEOUS CORONARY STENT INTERVENTION (PCI-S);  Surgeon: Laverda Page, MD;  Location: [facility] CATH LAB;  Service: Cardiovascular;;   PERCUTANEOUS CORONARY STENT INTERVENTION (PCI-S) N/A 12/28/2013   Procedure: PERCUTANEOUS CORONARY STENT INTERVENTION (PCI-S);  Surgeon: Laverda Page, MD;  Location: [facility] CATH LAB;  Service: Cardiovascular;  Laterality: N/A;   PROSTATE SURGERY     TONSILLECTOMY  1937   TRANSURETHRAL RESECTION OF PROSTATE  ~ 2007     Home Medications:  Prior to Admission medications   Medication Sig Start Date End Date Taking? Authorizing Provider  clopidogrel (PLAVIX) 75 MG tablet Take 1 tablet (75 mg total) by mouth daily with breakfast. 11/26/13  Yes Adrian Prows, MD  docusate sodium (COLACE) 100 MG capsule Take 1 capsule (100 mg total) by mouth daily. 10/15/17  Yes Caccavale, Sophia, PA-C  tamsulosin (FLOMAX) 0.4 MG CAPS capsule Take 1 capsule (0.4 mg total) by mouth daily. 11/17/17  Yes Mesner, Corene Cornea, MD    Inpatient Medications: Scheduled Meds:  Chlorhexidine Gluconate Cloth  6 each Topical Daily   insulin aspart  0-6 Units Subcutaneous Q6H   polyethylene glycol  17 g Oral BID   Continuous Infusions:  dextrose 5% lactated ringers 75 mL/hr at 04/08/21 2043   PRN Meds: acetaminophen **OR** acetaminophen, fentaNYL (SUBLIMAZE) injection, naLOXone (NARCAN)  injection, ondansetron (ZOFRAN) IV  Allergies:    Allergies  Allergen Reactions   Lipitor [Atorvastatin Calcium] Other (See Comments)    Irregular lab results--pt unsure of exact lab    Social History:   Social History   Socioeconomic History   Marital status: Single    Spouse name: Not on file   Number of children: Not on file   Years of education: Not on file   Highest education level: Not on file  Occupational History   Not on file  Tobacco Use   Smoking status: Former   Smokeless tobacco: Never   Tobacco comments:    "quit smoking in my 20's"  Vaping Use   Vaping Use: Never used  Substance and Sexual Activity   Alcohol use: Yes    Comment: "stopped drinking in the 1970's"   Drug use: No   Sexual activity: Never  Other Topics Concern   Not on file  Social History Narrative   Not on file   Social Determinants of Health   Financial Resource Strain: Not on file  Food Insecurity: Not on file  Transportation Needs: Not on file  Physical Activity: Not on file  Stress: Not on file  Social Connections: Not on file  Intimate Partner Violence: Not on file    Family History:    Family History  Problem Relation Age of Onset   Heart disease Mother    Stroke Mother    Heart disease Father    Diabetes Brother      ROS:  Please see the history of present illness.   General:no colds or fevers, no weight changes Skin:no rashes or ulcers HEENT:no blurred vision, no congestion CV:see HPI PUL:see HPI GI:no diarrhea constipation or melena, no indigestion, hx of ing. hernias GU:no hematuria, no dysuria MS:no joint pain, no claudication Neuro:no syncope, no lightheadedness Endo:+ diabetes, no thyroid disease  All other ROS reviewed and negative.     Physical Exam/Data:   Vitals:   04/08/21 1329 04/08/21 2055 04/09/21 0514 04/09/21 0700  BP: Marland Kitchen)  98/56 (!) 95/56 (!) 101/56   Pulse: 60 (!) 54 (!) 52   Resp: 16 16 16    Temp: 97.6 F (36.4 C) 97.7 F (36.5 C) 98.4 F (36.9 C)   TempSrc:  Oral Oral   SpO2: 95% 93% 98%   Weight:    82 kg  Height:    5\' 10"  (1.778 m)    Intake/Output Summary (Last 24 hours) at 04/09/2021 1009 Last data filed at 04/09/2021 0600 Gross per 24 hour  Intake 1592.56 ml  Output 1220 ml  Net 372.56 ml   Last 3 Weights 04/09/2021 02/09/2020 11/17/2017  Weight (lbs) 180 lb 12.4 oz 180 lb 12.4 oz 147 lb  Weight (kg) 82 kg 82 kg 66.679 kg     Body mass index is 25.94 kg/m.  General:  frail male , in no acute distress HEENT: normal Neck: no JVD Vascular: No carotid bruits; Distal pulses 2+ bilaterally Cardiac:  irregular with premature beats or atrial fib ; RRR; no murmur gallup rub or click Lungs:  clear to auscultation bilaterally, no wheezing, rhonchi or rales  Abd: soft, nontender, no hepatomegaly  Ext: no edema Musculoskeletal:  No deformities, BUE and BLE strength normal and equal Skin: warm and dry  Neuro:  alert and oriented X 3 MAE follows commands, no focal abnormalities noted Psych:  Normal affect    Relevant CV Studies: Cardiac cath 2015 Nov Brief Angiographic data: Left main coronary artery is mildly calcified with a eccentric shelflike lesion in the superior aspect constituting 30-40% stenosis. LAD is heavily calcified, with a 70% stenosis in the proximal segment followed by total occlusion which is  old. Ramus intermediate is a large vessel with mild to moderate diffuse disease and calcification without high-grade stenosis.   Circumflex coronary artery: A  large vessel and is subtotally occluded with TIMI 1 flow in the distal segment. Ostium has a 40-50% stenosis in some views appears to be 60%. Proximal segment has a 95% stenosis followed by a subtotal occlusion in the mid to distal segment.   Interventional data: PTCA and stenting of the mid to distal and proximal circumflex coronary artery with implantation of 2 overlapping 2.5 x 26 mm and a 2.5 x 18 mm resolute integrity DES. Patient will be continued on aspirin and Plavix, probably long-term. Month before stent to mRCA   Prior echo with EF 20%    Laboratory Data:  High Sensitivity Troponin:  No results for input(s): TROPONINIHS in the last 720 hours.   Chemistry Recent Labs  Lab 04/07/21 1400 04/08/21 0416 04/09/21 0354  NA 137 137 136  K 4.0 4.6 4.0  CL 100 103 104  CO2 30 30 29   GLUCOSE 143* 88 77  BUN 18 16 15   CREATININE 0.70 0.73 0.55*  CALCIUM 9.1 8.7* 8.4*  MG  --  2.0   2.1 1.9  GFRNONAA >60 >60 >60  ANIONGAP 7 4* 3*    Recent Labs  Lab 04/07/21 1400 04/08/21 0416 04/09/21 0354  PROT 6.8 5.9* 5.3*  ALBUMIN 4.0 3.4* 3.2*  AST 17 18 24   ALT 11 11 10   ALKPHOS 64 53 47  BILITOT 0.7 0.5 1.0   Lipids No results for input(s): CHOL, TRIG, HDL, LABVLDL, LDLCALC, CHOLHDL in the last 168 hours.  Hematology Recent Labs  Lab 04/07/21 1400 04/08/21 0416 04/09/21 0354  WBC 2.9* 4.5 3.5*  RBC 3.77* 3.30* 3.21*  HGB 11.8* 10.3* 10.1*  HCT 35.5* 31.6* 31.0*  MCV 94.2 95.8 96.6  MCH  31.3 31.2 31.5  MCHC 33.2 32.6 32.6  RDW 15.7* 15.8* 15.7*  PLT 127* 115* 105*   Thyroid No results for input(s): TSH, FREET4 in the last 168 hours.  BNPNo results for input(s): BNP, PROBNP in the last 168 hours.  DDimer No results for input(s): DDIMER in the last 168 hours.   Radiology/Studies:  CT ABDOMEN PELVIS W  CONTRAST  Result Date: 04/07/2021 CLINICAL DATA:  A 88 year old male presents for evaluation of constipation and emesis, concern for bowel obstruction, presenting with LEFT inguinal mass. EXAM: CT ABDOMEN AND PELVIS WITH CONTRAST TECHNIQUE: Multidetector CT imaging of the abdomen and pelvis was performed using the standard protocol following bolus administration of intravenous contrast. RADIATION DOSE REDUCTION: This exam was performed according to the departmental dose-optimization program which includes automated exposure control, adjustment of the mA and/or kV according to patient size and/or use of iterative reconstruction technique. CONTRAST:  180mL OMNIPAQUE IOHEXOL 300 MG/ML  SOLN COMPARISON:  Comparison made with November 17, 2017. FINDINGS: Lower chest: Lung bases are clear. Heart size is enlarged. Signs of coronary artery disease. Thinning of the LEFT ventricular apex as before. Hepatobiliary: Liver without focal lesion. Portal vein is patent. Gallbladder is contracted. There is mild intrahepatic biliary duct distension that is new from previous imaging. Pancreas: Mild pancreatic atrophy. No substantial pancreatic duct distension. Spleen: Normal. Adrenals/Urinary Tract: Adrenal glands are normal. RIGHT renal cysts. Nephrolithiasis with calculi in the interpolar RIGHT kidney and lower pole of the RIGHT kidney. Interpolar calculus similar to the prior study. Lower pole calculus new compared to previous imaging. No suspicious renal lesion. No perivesical stranding. No hydronephrosis. Stomach/Bowel: Abundant stool is present in the colon. No surrounding stranding. The appendix is normal. Recurrent LEFT inguinal hernia contains a focally dilated small bowel loops with mild surrounding stranding. Abdominal wall defect best seen on the sagittal images, image 112/5) defect approximately 5 mm. Diffuse bowel dilation upstream from the herniated bowel. Hernia likely occurs at the level of the proximal ileum. Bowel  can be seen crossing from RIGHT to LEFT in the abdomen towards the ileocecal valve. This bowel is decompressed/collapsed in the RIGHT upper quadrant. Distortion of mesenteric vessels likely due to bowel distension and presence of hernia in the LEFT lower quadrant distorting bowel loops. Bowel upstream from the hernia is dilated up to 3 cm. No signs of pneumatosis. Colonic diverticulosis. Vascular/Lymphatic: Aortic atherosclerosis. No sign of aneurysm. Smooth contour of the IVC. There is no gastrohepatic or hepatoduodenal ligament lymphadenopathy. No retroperitoneal or mesenteric lymphadenopathy. No pelvic sidewall lymphadenopathy. Reproductive: Mild prostatomegaly with heterogeneity. Other: Small volume fluid in the abdomen along the RIGHT pericolic gutter. Diffuse mild mesenteric edema, difficult to quantify given the lack of mesenteric fat. No pneumoperitoneum. Musculoskeletal: Spinal degenerative changes. No acute or destructive bone process. IMPRESSION: 1. Recurrent LEFT inguinal hernia leading to small bowel obstruction either high-grade partial or complete small bowel obstruction in the LEFT inguinal region. Defect in the abdominal wall fascia is narrow, based on this the hernia is at high-risk for strangulation or incarceration. Surgical consultation is suggested. 2. Small volume fluid in the abdomen along the RIGHT pericolic gutter. No pneumatosis or free air currently. 3. Diffuse mild mesenteric edema, difficult to quantify given the lack of mesenteric fat. 4. Mild intra and extrahepatic biliary duct distension that is new from previous imaging. Correlation with hepatic function tests may be helpful to determine whether follow-up or further assessment may be warranted. 5. Nonobstructing right nephrolithiasis. 6. Colonic diverticulosis without evidence of acute  diverticulitis. 7. Aortic atherosclerosis. Aortic Atherosclerosis (ICD10-I70.0). Electronically Signed   By: Zetta Bills M.D.   On: 04/07/2021  16:57   DG Abd Portable 1V  Result Date: 04/08/2021 CLINICAL DATA:  Small bowel obstruction EXAM: PORTABLE ABDOMEN - 1 VIEW COMPARISON:  CT abdomen pelvis, 04/07/2021 FINDINGS: Nonobstructive pattern of bowel gas. Gas-filled, nondistended loops of small bowel in the central abdomen measuring up to 2.8 cm in caliber. Large burden of stool throughout the colon and rectum, with gas and stool present to the rectum. Excreted contrast in the urinary bladder. No obvious free air on single supine radiograph. IMPRESSION: 1. Nonobstructive pattern of bowel gas. Gas-filled, nondistended loops of small bowel in the central abdomen measuring up to 2.8 cm in caliber. 2. Large burden of stool throughout the colon and rectum, with gas and stool present to the rectum. Electronically Signed   By: Delanna Ahmadi M.D.   On: 04/08/2021 10:33   US Abdomen Limited RUQ (LIVER/GB)  Result Date: 04/08/2021 CLINICAL DATA:  Bile duct dilation seen in the previous CT EXAM: ULTRASOUND ABDOMEN LIMITED RIGHT UPPER QUADRANT COMPARISON:  CT done on 04/07/2021 FINDINGS: Gallbladder: There are echogenic foci in the lumen of nondistended gallbladder suggesting gallstones. Technologist did not observe any tenderness over the gallbladder. There is no fluid around the gallbladder. Common bile duct: Proximal common bile duct measures 7.3 mm in diameter without definite intrahepatic duct dilation. Distal common bile duct is not adequately visualized for evaluation. Liver: There is coarsening of echoes in the liver. There is possible mild nodularity in the liver surface. No focal abnormality is seen. Portal vein is patent on color Doppler imaging with normal direction of blood flow towards the liver. Other: None. IMPRESSION: Gallbladder stones. There are no sonographic signs of acute cholecystitis. Proximal common bile duct is prominent measuring 7.3 mm. There is no significant dilation of intrahepatic bile ducts. Distal common bile duct is not  adequately visualized limiting evaluation for choledocholithiasis. There is coarsening of echoes in the liver which may be due to fatty infiltration or cirrhosis. No focal abnormality is seen in the visualized portions of liver. Electronically Signed   By: Elmer Picker M.D.   On: 04/08/2021 16:45     Assessment and Plan:   Preop eval for possible hernia repair.  Pt with hx of ICM though no follow up echo in chart EF was 20%, and CAD with prior stents to RCA and LCx.  Not on statins  though per record on Plavix now on hold for possible surgery -  currently no EKG and irregular HR. I have ordered EKG and echo.  Would prefer both to eval.  He is class IV risk with 11% risk of major cardiac event.  Hold plavix until surgery decided but would resume.  Dr Percival Spanish to see. Irregular HR has hx of PVCs but concern for atrial fib have ordered stat EKG.  Per VS HR 60-54 CAD with hx of stents to RCA and LCx in 2015 denies angina appears to have been on plavix which should be continued post op or resumed if no surgery.  ICM with last EF 20% euvolemic on exam and dehydrated on admit  have ordered echo.for clearance eval and further treatment though unsure how aggressive we can be with low BP and already with palliative care.Marland Kitchen     SBO treating medically but may need surgery per surgical team. And Lt inguinal hernia reduced in ER by gen. surgery DM-2 per IM  Thrombocytopenia per IM  For questions or updates, please contact [place] Please consult [email or web address] for contact info under    Signed, Cecilie Kicks, NP  04/09/2021 10:09 AM  History and all data above reviewed.  Patient examined.  I agree with the findings as above.   He is a rather taciturn gentleman who is asking about going home and says he really does not want surgery.  He gets around at his nursing home with a cane and says he goes to the dining hall 3 times a day.  He does not report any recent chest pressure, neck or arm discomfort.   He does not report any shortness of breath, PND or orthopnea.  However, as described is not very forthcoming.  The patient exam reveals COR:RRR, soft apical systolic murmur  ,  Lungs: Clear  ,  Abd: Positive bowel sounds, no rebound no guarding, Ext Diffuse muscle wasting.  Overall very frail and cachectic appearing  .  All available labs, radiology testing, previous records reviewed. Agree with documented assessment and plan.   PREOP:  We are waiting for an echo and EKG.  However, his risk of any surgery is likely to be related to his overall advanced age and frailty in addition to severe cardiomyopathy and known CAD.  There are no high risk features or findings that would preclude him absolutely having the necessary surgery.  However, complication risk even for this surgery is not particularly high risk from a cardiovascular standpoint is high because of his obvious protein calorie malnutrition frailty.  We would have to watch his volume carefully.  No other cardiac testing would be helpful preoperatively.  Jeneen Rinks Eual Lindstrom  11:53 AM  04/09/2021

## 2021-04-09 NOTE — TOC Initial Note (Signed)
Transition of Care [facility]) - Initial/Assessment Note   Patient Details  Name: [name] [name] MRN: [id number] Date of Birth: [birth year]  Transition of Care [facility]) CM/SW Contact:    Sherie Don, LCSW Phone Number: 04/09/2021, 12:17 PM  Clinical Narrative: CSW spoke with patient to complete assessment. Per patient, he resides at [facility] and he plans to return there after discharge. Patient reported he uses a cane for mobility at baseline at the facility. FL2 started. TOC to follow.  Expected Discharge Plan: Assisted Living Barriers to Discharge: Continued Medical Work up  Patient Goals and CMS Choice Patient states their goals for this hospitalization and ongoing recovery are:: Return to [facility] ALF Choice offered to / list presented to : NA  Expected Discharge Plan and Services Expected Discharge Plan: Assisted Living In-house Referral: Clinical Social Work Post Acute Care Choice: NA Living arrangements for the past 2 months: [place]            DME Arranged: N/A DME Agency: NA  Prior Living Arrangements/Services Living arrangements for the past 2 months: [place] Lives with:: Facility Resident Patient language and need for interpreter reviewed:: Yes Do you feel safe going back to the place where you live?: Yes      Need for Family Participation in Patient Care: No (Comment) Care giver support system in place?: Yes (comment) Current home services: DME Kasandra Knudsen) Criminal Activity/Legal Involvement Pertinent to Current Situation/Hospitalization: No - Comment as needed  Activities of Daily Living Home Assistive Devices/Equipment: Cane (specify quad or straight) ADL Screening (condition at time of admission) Patient's cognitive ability adequate to safely complete daily activities?: Yes Is the patient deaf or have difficulty hearing?: No Does the patient have difficulty seeing, even when wearing glasses/contacts?: No Does the  patient have difficulty concentrating, remembering, or making decisions?: Yes Patient able to express need for assistance with ADLs?: Yes Does the patient have difficulty dressing or bathing?: Yes Independently performs ADLs?: Yes (appropriate for developmental age) Does the patient have difficulty walking or climbing stairs?: Yes Weakness of Legs: Both Weakness of Arms/Hands: None  Emotional Assessment Attitude/Demeanor/Rapport: Engaged Affect (typically observed): Accepting Orientation: : Oriented to Self, Oriented to Place, Oriented to  Time, Oriented to Situation Alcohol / Substance Use: Not Applicable  Admission diagnosis:  Nephrolithiasis [N20.0] SBO (small bowel obstruction) ([place]) [K56.609] Incarcerated hernia [K46.0] Abnormal computed tomography angiography (CTA) of abdomen and pelvis [R93.5] Patient Active Problem List   Diagnosis Date Noted   Left inguinal hernia 04/08/2021   Dehydration 04/08/2021   Acute prerenal azotemia 04/08/2021   Thrombocytopenia ([place]) 04/08/2021   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   Acute urinary retention 04/08/2021   BPH (benign prostatic hyperplasia)    SBO (small bowel obstruction) ([place]) 04/07/2021   S/P PTCA (percutaneous transluminal coronary angioplasty) 12/28/2013   CAD (coronary artery disease), native coronary artery 12/26/2013   Postsurgical percutaneous transluminal coronary angioplasty (PTCA) status 11/25/2013   ACUTE ATOPIC CONJUNCTIVITIS 01/08/2007   ACUTE BRONCHITIS 01/08/2007   INSOMNIA-SLEEP DISORDER-UNSPEC 12/19/2006   ANXIETY 12/10/2006   DEPRESSION 12/10/2006   TINNITUS, CHRONIC 12/10/2006   ALLERGIC RHINITIS 12/10/2006   BENIGN PROSTATIC HYPERTROPHY 12/10/2006   WEIGHT LOSS 12/10/2006   GLUCOSE INTOLERANCE, MINIMAL, HX OF 12/10/2006   NEPHROLITHIASIS, HX OF 12/10/2006   RHINITIS, ALLERGIC NEC 12/09/2006   Unspecified asthma(493.90) 12/09/2006   COLONIC POLYPS 11/24/2006   Diabetes mellitus without complication ([place])  10/08/4816   HYPERLIPIDEMIA 11/24/2006   HYPERTENSION 11/24/2006   GERD 11/24/2006  Diverticulosis of colon (without mention of hemorrhage) 11/24/2006   CALCULUS, KIDNEY 11/24/2006   BENIGN PROSTATIC HYPERTROPHY, HX OF 11/24/2006   PCP:  Antony Contras, MD Pharmacy:   [facility] [place], [place] AT [address] [place] [address] Phone: [phone] Fax: [phone]  Readmission Risk Interventions No flowsheet data found.

## 2021-04-09 NOTE — Progress Notes (Addendum)
PROGRESS NOTE    [name] [name]  KZS:[id number] DOB: Sep 22, 1928 DOA: 04/07/2021 PCP: Antony Contras, MD  Chief Complaint  Patient presents with   Groin Pain    Brief Narrative:  88 yo with hx bilateral inguinal hernia s/p repair (left 1962, right 1980), BPH, T2DM, GERD and multiple other medical issues who presented with abdominal pain and was found to have incarcerated L inguinal hernia with SBO.  Hernia reduced in ED by general surgery.    See below for additional details   Assessment & Plan:   Principal Problem:   SBO (small bowel obstruction) (HCC) Active Problems:   Coronary artery disease   Left inguinal hernia   Diabetes mellitus without complication (HCC)   Acute urinary retention   Dehydration   Acute prerenal azotemia   Thrombocytopenia (HCC)   BPH (benign prostatic hyperplasia)   Biliary anomaly   Assessment and Plan: * SBO (small bowel obstruction) (HCC)- (present on admission) CT showing recurrent L inguinal hernia with SBO (high grade partial or complete SBO) Inguinal hernia reduced by surgery in ED Plain film with nonobstructive pattern of bowel gas, large stool burden Appreciate surgery assistance - patient wants to avoid surgery if possible, advanced to [facility] was consulted for surgical clearance - awaiting EKG, echo  Left inguinal hernia Reduced in ED by general surgery Appreciate further recs  Coronary artery disease Hx stent Holding plavix for now  Acute urinary retention Foley placed 2/12 flomax Consider tov 2/14  Diabetes mellitus without complication ([place]) SSI  Thrombocytopenia ([place]) Downtrending, follow  Dehydration- (present on admission) Improved with IVF  BPH (benign prostatic hyperplasia)- (present on admission) BPH  Biliary anomaly CT with mild intra and extrahepatic biliary duct distension LFT's wnl Will follow RUQ [place] -> prominent proximal CBD, no significant dilation of intrahepatic bile duct LFT's wnl, follow  outpatient   DVT prophylaxis: SCD Code Status: full Family Communication: none Disposition:   Status is: Inpatient Remains inpatient appropriate because: need for surgery eval   Consultants:  General surgery  Procedures:  none  Antimicrobials:  Anti-infectives (From admission, onward)    None       Subjective: Asking when he could go home Discussed will need to make sure he's tolerating diet  Objective: Vitals:   04/08/21 2055 04/09/21 0514 04/09/21 0700 04/09/21 1438  BP: (!) 95/56 (!) 101/56  (!) 108/58  Pulse: (!) 54 (!) 52  (!) 51  Resp: 16 16  18   Temp: 97.7 F (36.5 C) 98.4 F (36.9 C)  98.2 F (36.8 C)  TempSrc: Oral Oral  Oral  SpO2: 93% 98%  94%  Weight:   82 kg   Height:   5\' 10"  (1.778 m)     Intake/Output Summary (Last 24 hours) at 04/09/2021 1621 Last data filed at 04/09/2021 1500 Gross per 24 hour  Intake 1949.26 ml  Output 1920 ml  Net 29.26 ml   Filed Weights   04/09/21 0700  Weight: 82 kg    Examination:  General: No acute distress. Cardiovascular: RRR Lungs: unlabored Abdomen: Soft, nontender, nondistended  Neurological: Alert and oriented 3. Moves all extremities 4. Cranial nerves II through XII grossly intact. Skin: Warm and dry. No rashes or lesions. Extremities: No clubbing or cyanosis. No edema.    Data Reviewed: I have personally reviewed following labs and imaging studies  CBC: Recent Labs  Lab 04/07/21 1400 04/08/21 0416 04/09/21 0354  WBC 2.9* 4.5 3.5*  NEUTROABS 2.2 3.5 2.4  HGB 11.8* 10.3* 10.1*  HCT 35.5* 31.6* 31.0*  MCV 94.2 95.8 96.6  PLT 127* 115* 105*    Basic Metabolic Panel: Recent Labs  Lab 04/07/21 1400 04/08/21 0416 04/09/21 0354  NA 137 137 136  K 4.0 4.6 4.0  CL 100 103 104  CO2 30 30 29   GLUCOSE 143* 88 77  BUN 18 16 15   CREATININE 0.70 0.73 0.55*  CALCIUM 9.1 8.7* 8.4*  MG  --  2.0   2.1 1.9  PHOS  --   --  3.1    GFR: Estimated Creatinine Clearance: 63.4 mL/min (Audley Hinojos) (by  C-G formula based on SCr of 0.55 mg/dL (L)).  Liver Function Tests: Recent Labs  Lab 04/07/21 1400 04/08/21 0416 04/09/21 0354  AST 17 18 24   ALT 11 11 10   ALKPHOS 64 53 47  BILITOT 0.7 0.5 1.0  PROT 6.8 5.9* 5.3*  ALBUMIN 4.0 3.4* 3.2*    CBG: Recent Labs  Lab 04/08/21 1747 04/08/21 1817 04/08/21 2331 04/09/21 0612 04/09/21 1151  GLUCAP 53* 161* 87 79 80     Recent Results (from the past 240 hour(s))  Resp Panel by RT-PCR (Flu Purcell Jungbluth&B, Covid) Nasopharyngeal Swab     Status: None   Collection Time: 04/07/21  9:37 PM   Specimen: Nasopharyngeal Swab; Nasopharyngeal(NP) swabs in vial transport medium  Result Value Ref Range Status   SARS Coronavirus 2 by RT PCR NEGATIVE NEGATIVE Final    Comment: (NOTE) SARS-CoV-2 target nucleic acids are NOT DETECTED.  The SARS-CoV-2 RNA is generally detectable in upper respiratory specimens during the acute phase of infection. The lowest concentration of SARS-CoV-2 viral copies this assay can detect is 138 copies/mL. Jamez Ambrocio negative result does not preclude SARS-Cov-2 infection and should not be used as the sole basis for treatment or other patient management decisions. Salma Walrond negative result may occur with  improper specimen collection/handling, submission of specimen other than nasopharyngeal swab, presence of viral mutation(s) within the areas targeted by this assay, and inadequate number of viral copies(<138 copies/mL). Aleesa Sweigert negative result must be combined with clinical observations, patient history, and epidemiological information. The expected result is Negative.  Fact Sheet for Patients:  EntrepreneurPulse.com.au  Fact Sheet for Healthcare Providers:  IncredibleEmployment.be  This test is no t yet approved or cleared by the [place] FDA and  has been authorized for detection and/or diagnosis of SARS-CoV-2 by FDA under an Emergency Use Authorization (EUA). This EUA will remain  in effect (meaning  this test can be used) for the duration of the COVID-19 declaration under Section 564(b)(1) of the Act, 21 U.S.C.section 360bbb-3(b)(1), unless the authorization is terminated  or revoked sooner.       Influenza Destry Dauber by PCR NEGATIVE NEGATIVE Final   Influenza B by PCR NEGATIVE NEGATIVE Final    Comment: (NOTE) The Xpert Xpress SARS-CoV-2/FLU/RSV plus assay is intended as an aid in the diagnosis of influenza from Nasopharyngeal swab specimens and should not be used as Lacrystal Barbe sole basis for treatment. Nasal washings and aspirates are unacceptable for Xpert Xpress SARS-CoV-2/FLU/RSV testing.  Fact Sheet for Patients: EntrepreneurPulse.com.au  Fact Sheet for Healthcare Providers: IncredibleEmployment.be  This test is not yet approved or cleared by the [place] FDA and has been authorized for detection and/or diagnosis of SARS-CoV-2 by FDA under an Emergency Use Authorization (EUA). This EUA will remain in effect (meaning this test can be used) for the duration of the COVID-19 declaration under Section 564(b)(1) of the Act, 21 U.S.C. section 360bbb-3(b)(1), unless the authorization is terminated or  revoked.  Performed at [facility], [place] [address] [place], [place] [address]          Radiology Studies: DG Abd Portable 1V  Result Date: 04/08/2021 CLINICAL DATA:  Small bowel obstruction EXAM: PORTABLE ABDOMEN - 1 VIEW COMPARISON:  CT abdomen pelvis, 04/07/2021 FINDINGS: Nonobstructive pattern of bowel gas. Gas-filled, nondistended loops of small bowel in the central abdomen measuring up to 2.8 cm in caliber. Large burden of stool throughout the colon and rectum, with gas and stool present to the rectum. Excreted contrast in the urinary bladder. No obvious free air on single supine radiograph. IMPRESSION: 1. Nonobstructive pattern of bowel gas. Gas-filled, nondistended loops of small bowel in the central abdomen measuring up to  2.8 cm in caliber. 2. Large burden of stool throughout the colon and rectum, with gas and stool present to the rectum. Electronically Signed   By: Delanna Ahmadi M.D.   On: 04/08/2021 10:33   US Abdomen Limited RUQ (LIVER/GB)  Result Date: 04/08/2021 CLINICAL DATA:  Bile duct dilation seen in the previous CT EXAM: ULTRASOUND ABDOMEN LIMITED RIGHT UPPER QUADRANT COMPARISON:  CT done on 04/07/2021 FINDINGS: Gallbladder: There are echogenic foci in the lumen of nondistended gallbladder suggesting gallstones. Technologist did not observe any tenderness over the gallbladder. There is no fluid around the gallbladder. Common bile duct: Proximal common bile duct measures 7.3 mm in diameter without definite intrahepatic duct dilation. Distal common bile duct is not adequately visualized for evaluation. Liver: There is coarsening of echoes in the liver. There is possible mild nodularity in the liver surface. No focal abnormality is seen. Portal vein is patent on color Doppler imaging with normal direction of blood flow towards the liver. Other: None. IMPRESSION: Gallbladder stones. There are no sonographic signs of acute cholecystitis. Proximal common bile duct is prominent measuring 7.3 mm. There is no significant dilation of intrahepatic bile ducts. Distal common bile duct is not adequately visualized limiting evaluation for choledocholithiasis. There is coarsening of echoes in the liver which may be due to fatty infiltration or cirrhosis. No focal abnormality is seen in the visualized portions of liver. Electronically Signed   By: Elmer Picker M.D.   On: 04/08/2021 16:45        Scheduled Meds:  Chlorhexidine Gluconate Cloth  6 each Topical Daily   feeding supplement  237 mL Oral TID WC   insulin aspart  0-6 Units Subcutaneous Q6H   polyethylene glycol  17 g Oral BID   tamsulosin  0.4 mg Oral Daily   Continuous Infusions:  dextrose 5% lactated ringers 75 mL/hr at 04/08/21 2043     LOS: 2 days     Time spent: over 30 min    Fayrene Helper, MD [facility]   To contact the attending provider between 7A-7P or the covering provider during after hours 7P-7A, please log into the web site [email or web address] and access using universal [place] password for that web site. If you do not have the password, please call the hospital operator.  04/09/2021, 4:21 PM

## 2021-04-09 NOTE — Assessment & Plan Note (Signed)
Hx stent Holding plavix for now

## 2021-04-09 NOTE — Progress Notes (Addendum)
Subjective/Chief Complaint: Denies pain this AM. Reports flatus but no BM. Refusing miralax or prune juice. Refusing PT. When I ask about surgery he states he does not want surgery unless his hernia recurs.   States prior to admission he was living in an independent living facility and mobilized with a cane.   Reports inguinal hernia repairs at [facility] in 1961.  Objective: Vital signs in last 24 hours: Temp:  [97.6 F (36.4 C)-98.4 F (36.9 C)] 98.4 F (36.9 C) (02/13 0514) Pulse Rate:  [52-60] 52 (02/13 0514) Resp:  [16] 16 (02/13 0514) BP: (95-101)/(56) 101/56 (02/13 0514) SpO2:  [93 %-98 %] 98 % (02/13 0514) Weight:  [82 kg] 82 kg (02/13 0700) Last BM Date: 04/07/21  Intake/Output from previous day: 02/12 0701 - 02/13 0700 In: 2160.2 [I.V.:2160.2] Out: 1220 [Urine:1220] Intake/Output this shift: No intake/output data recorded.  General appearance: alert, no acute distress Resp: breathing comfortably GI: soft, non distended Male genitalia: left groin WITHOUT evidence of recurrence.   Psych: A&Ox3, flat affect   Lab Results:  Recent Labs    04/08/21 0416 04/09/21 0354  WBC 4.5 3.5*  HGB 10.3* 10.1*  HCT 31.6* 31.0*  PLT 115* 105*   BMET Recent Labs    04/08/21 0416 04/09/21 0354  NA 137 136  K 4.6 4.0  CL 103 104  CO2 30 29  GLUCOSE 88 77  BUN 16 15  CREATININE 0.73 0.55*  CALCIUM 8.7* 8.4*   PT/INR Recent Labs    04/08/21 0416  LABPROT 15.0  INR 1.2   ABG No results for input(s): PHART, HCO3 in the last 72 hours.  Invalid input(s): PCO2, PO2  Studies/Results: CT ABDOMEN PELVIS W CONTRAST  Result Date: 04/07/2021 CLINICAL DATA:  A 87 year old male presents for evaluation of constipation and emesis, concern for bowel obstruction, presenting with LEFT inguinal mass. EXAM: CT ABDOMEN AND PELVIS WITH CONTRAST TECHNIQUE: Multidetector CT imaging of the abdomen and pelvis was performed using the standard protocol following bolus  administration of intravenous contrast. RADIATION DOSE REDUCTION: This exam was performed according to the departmental dose-optimization program which includes automated exposure control, adjustment of the mA and/or kV according to patient size and/or use of iterative reconstruction technique. CONTRAST:  142mL OMNIPAQUE IOHEXOL 300 MG/ML  SOLN COMPARISON:  Comparison made with November 17, 2017. FINDINGS: Lower chest: Lung bases are clear. Heart size is enlarged. Signs of coronary artery disease. Thinning of the LEFT ventricular apex as before. Hepatobiliary: Liver without focal lesion. Portal vein is patent. Gallbladder is contracted. There is mild intrahepatic biliary duct distension that is new from previous imaging. Pancreas: Mild pancreatic atrophy. No substantial pancreatic duct distension. Spleen: Normal. Adrenals/Urinary Tract: Adrenal glands are normal. RIGHT renal cysts. Nephrolithiasis with calculi in the interpolar RIGHT kidney and lower pole of the RIGHT kidney. Interpolar calculus similar to the prior study. Lower pole calculus new compared to previous imaging. No suspicious renal lesion. No perivesical stranding. No hydronephrosis. Stomach/Bowel: Abundant stool is present in the colon. No surrounding stranding. The appendix is normal. Recurrent LEFT inguinal hernia contains a focally dilated small bowel loops with mild surrounding stranding. Abdominal wall defect best seen on the sagittal images, image 112/5) defect approximately 5 mm. Diffuse bowel dilation upstream from the herniated bowel. Hernia likely occurs at the level of the proximal ileum. Bowel can be seen crossing from RIGHT to LEFT in the abdomen towards the ileocecal valve. This bowel is decompressed/collapsed in the RIGHT upper quadrant. Distortion of mesenteric vessels  likely due to bowel distension and presence of hernia in the LEFT lower quadrant distorting bowel loops. Bowel upstream from the hernia is dilated up to 3 cm. No signs  of pneumatosis. Colonic diverticulosis. Vascular/Lymphatic: Aortic atherosclerosis. No sign of aneurysm. Smooth contour of the IVC. There is no gastrohepatic or hepatoduodenal ligament lymphadenopathy. No retroperitoneal or mesenteric lymphadenopathy. No pelvic sidewall lymphadenopathy. Reproductive: Mild prostatomegaly with heterogeneity. Other: Small volume fluid in the abdomen along the RIGHT pericolic gutter. Diffuse mild mesenteric edema, difficult to quantify given the lack of mesenteric fat. No pneumoperitoneum. Musculoskeletal: Spinal degenerative changes. No acute or destructive bone process. IMPRESSION: 1. Recurrent LEFT inguinal hernia leading to small bowel obstruction either high-grade partial or complete small bowel obstruction in the LEFT inguinal region. Defect in the abdominal wall fascia is narrow, based on this the hernia is at high-risk for strangulation or incarceration. Surgical consultation is suggested. 2. Small volume fluid in the abdomen along the RIGHT pericolic gutter. No pneumatosis or free air currently. 3. Diffuse mild mesenteric edema, difficult to quantify given the lack of mesenteric fat. 4. Mild intra and extrahepatic biliary duct distension that is new from previous imaging. Correlation with hepatic function tests may be helpful to determine whether follow-up or further assessment may be warranted. 5. Nonobstructing right nephrolithiasis. 6. Colonic diverticulosis without evidence of acute diverticulitis. 7. Aortic atherosclerosis. Aortic Atherosclerosis (ICD10-I70.0). Electronically Signed   By: Zetta Bills M.D.   On: 04/07/2021 16:57   DG Abd Portable 1V  Result Date: 04/08/2021 CLINICAL DATA:  Small bowel obstruction EXAM: PORTABLE ABDOMEN - 1 VIEW COMPARISON:  CT abdomen pelvis, 04/07/2021 FINDINGS: Nonobstructive pattern of bowel gas. Gas-filled, nondistended loops of small bowel in the central abdomen measuring up to 2.8 cm in caliber. Large burden of stool throughout  the colon and rectum, with gas and stool present to the rectum. Excreted contrast in the urinary bladder. No obvious free air on single supine radiograph. IMPRESSION: 1. Nonobstructive pattern of bowel gas. Gas-filled, nondistended loops of small bowel in the central abdomen measuring up to 2.8 cm in caliber. 2. Large burden of stool throughout the colon and rectum, with gas and stool present to the rectum. Electronically Signed   By: Delanna Ahmadi M.D.   On: 04/08/2021 10:33   US Abdomen Limited RUQ (LIVER/GB)  Result Date: 04/08/2021 CLINICAL DATA:  Bile duct dilation seen in the previous CT EXAM: ULTRASOUND ABDOMEN LIMITED RIGHT UPPER QUADRANT COMPARISON:  CT done on 04/07/2021 FINDINGS: Gallbladder: There are echogenic foci in the lumen of nondistended gallbladder suggesting gallstones. Technologist did not observe any tenderness over the gallbladder. There is no fluid around the gallbladder. Common bile duct: Proximal common bile duct measures 7.3 mm in diameter without definite intrahepatic duct dilation. Distal common bile duct is not adequately visualized for evaluation. Liver: There is coarsening of echoes in the liver. There is possible mild nodularity in the liver surface. No focal abnormality is seen. Portal vein is patent on color Doppler imaging with normal direction of blood flow towards the liver. Other: None. IMPRESSION: Gallbladder stones. There are no sonographic signs of acute cholecystitis. Proximal common bile duct is prominent measuring 7.3 mm. There is no significant dilation of intrahepatic bile ducts. Distal common bile duct is not adequately visualized limiting evaluation for choledocholithiasis. There is coarsening of echoes in the liver which may be due to fatty infiltration or cirrhosis. No focal abnormality is seen in the visualized portions of liver. Electronically Signed   By: Elmer Picker  M.D.   On: 04/08/2021 16:45    Anti-infectives: Anti-infectives (From admission,  onward)    None       Assessment/Plan: Incarcerated recurrent left inguinal hernia- reduced, no signs of recurrence but also has not been OOB. SBO- related to above. KUB yesterday with non-obstructive pattern. no n/v, some flatus. Will advance to [facility].   Constipation - large stool burden on KUB. refusing laxatives, continue to encourage miralax, prune juice, mobilization.  I called cardiology for perioperative risk stratification should patients hernia recur or become incarcerated. Patient would like to avoid surgery if possible. No urgent or emergent indications for surgery today. Advance diet. Continue to hold plavix. Mobilize and bowel regimen!  Dr. Barry Dienes reviewed the patients plan of care and discussed surgery with the patients nephew 2/12. See her note.   LOS: 2 days   Moderate decision making   Obie Dredge, [facility], [place]

## 2021-04-09 NOTE — Evaluation (Signed)
Physical Therapy Evaluation Patient Details Name: [name] [name] MRN: [id number] DOB: 10/02/1928 Today's Date: 04/09/2021  History of Present Illness  88 yo with hx bilateral inguinal hernia s/p repair (left 1962, right 1980), BPH, T2DM, GERD and multiple other medical issues who presented 04/07/21 with abdominal pain and was found to have incarcerated L inguinal hernia with SBO.  Hernia reduced in ED by general surgery.  Clinical Impression  The patient  did consent to mobilizing to recliner, requiring 1 min assist to steady with Rw. Patient uses a cane at baseline, Resides at    [place].   Pt admitted with above diagnosis.  Pt currently with functional limitations due to the deficits listed below (see PT Problem List). Pt will benefit from skilled PT to increase their independence and safety with mobility to allow discharge to the venue listed below.        Recommendations for follow up therapy are one component of a multi-disciplinary discharge planning process, led by the attending physician.  Recommendations may be updated based on patient status, additional functional criteria and insurance authorization.  Follow Up Recommendations Home health PT    Assistance Recommended at Discharge Frequent or constant Supervision/Assistance  Patient can return home with the following  A little help with walking and/or transfers;A little help with bathing/dressing/bathroom;Assistance with cooking/housework;Direct supervision/assist for medications management;Assist for transportation;Direct supervision/assist for financial management    Equipment Recommendations None recommended by PT  Recommendations for Other Services       Functional Status Assessment Patient has had a recent decline in their functional status and demonstrates the ability to make significant improvements in function in a reasonable and predictable amount of time.     Precautions / Restrictions  Precautions Precautions: Fall Restrictions Weight Bearing Restrictions: No      Mobility  Bed Mobility Overal bed mobility: Needs Assistance Bed Mobility: Supine to Sit     Supine to sit: Min assist     General bed mobility comments: extra time,  required to lift buttocks up to prgtect periarea/edema    Transfers Overall transfer level: Needs assistance Equipment used: Rolling walker (2 wheels) Transfers: Sit to/from Stand Sit to Stand: Min assist           General transfer comment: steady assist to rise from bed and step to recliner using RW    Ambulation/Gait                  Stairs            Wheelchair Mobility    Modified Rankin (Stroke Patients Only)       Balance Overall balance assessment: Needs assistance Sitting-balance support: Feet supported, Bilateral upper extremity supported Sitting balance-Leahy Scale: Fair     Standing balance support: During functional activity, Bilateral upper extremity supported, Reliant on assistive device for balance Standing balance-Leahy Scale: Poor                               Pertinent Vitals/Pain Pain Assessment Pain Assessment: Faces Faces Pain Scale: Hurts little more Pain Location: periarea Pain Descriptors / Indicators: Discomfort Pain Intervention(s): Monitored during session, Repositioned    Home Living Family/patient expects to be discharged to:: Assisted living                 Home Equipment: Kasandra Knudsen - single point Additional Comments: from [facility]    Prior Function Prior Level of Function :  Independent/Modified Independent             Mobility Comments: ambulates with a cane, goes to 3 meals per day. ADLs Comments: reports he does his own ADL's     Hand Dominance   Dominant Hand: Right    Extremity/Trunk Assessment   Upper Extremity Assessment Upper Extremity Assessment: Generalized weakness    Lower Extremity Assessment Lower  Extremity Assessment: Generalized weakness    Cervical / Trunk Assessment Cervical / Trunk Assessment: Kyphotic  Communication   Communication: No difficulties  Cognition Arousal/Alertness: Awake/alert Behavior During Therapy: Flat affect Overall Cognitive Status: Within Functional Limits for tasks assessed                                 General Comments: stated it's sunday.        General Comments      Exercises     Assessment/Plan    PT Assessment Patient needs continued PT services  PT Problem List Decreased strength;Decreased mobility;Decreased safety awareness;Decreased knowledge of precautions;Decreased activity tolerance;Decreased knowledge of use of DME;Decreased cognition       PT Treatment Interventions DME instruction;Therapeutic activities;Cognitive remediation;Gait training;Therapeutic exercise;Patient/family education;Functional mobility training    PT Goals (Current goals can be found in the Care Plan section)  Acute Rehab PT Goals Patient Stated Goal: agreed to OOB to improve strength PT Goal Formulation: With patient Time For Goal Achievement: 04/23/21 Potential to Achieve Goals: Fair    Frequency Min 3X/week     Co-evaluation               AM-PAC PT "6 Clicks" Mobility  Outcome Measure Help needed turning from your back to your side while in a flat bed without using bedrails?: A Little Help needed moving from lying on your back to sitting on the side of a flat bed without using bedrails?: A Little Help needed moving to and from a bed to a chair (including a wheelchair)?: A Lot Help needed standing up from a chair using your arms (e.g., wheelchair or bedside chair)?: A Lot Help needed to walk in hospital room?: Total Help needed climbing 3-5 steps with a railing? : Total 6 Click Score: 12    End of Session Equipment Utilized During Treatment: Gait belt Activity Tolerance: Patient limited by fatigue Patient left: in  chair;with call bell/phone within reach;with chair alarm set Nurse Communication: Mobility status PT Visit Diagnosis: Unsteadiness on feet (R26.81);Muscle weakness (generalized) (M62.81);Difficulty in walking, not elsewhere classified (R26.2)    Time: 1517-6160 PT Time Calculation (min) (ACUTE ONLY): 23 min   Charges:   PT Evaluation $PT Eval Low Complexity: 1 Low PT Treatments $Therapeutic Activity: 8-22 mins        Tresa Endo PT Acute Rehabilitation Services Pager [phone] Office [phone]   Claretha Cooper 04/09/2021, 12:57 PM

## 2021-04-10 ENCOUNTER — Inpatient Hospital Stay (HOSPITAL_COMMUNITY): Payer: Medicare Other

## 2021-04-10 DIAGNOSIS — I5022 Chronic systolic (congestive) heart failure: Secondary | ICD-10-CM

## 2021-04-10 DIAGNOSIS — K56609 Unspecified intestinal obstruction, unspecified as to partial versus complete obstruction: Secondary | ICD-10-CM | POA: Diagnosis not present

## 2021-04-10 DIAGNOSIS — R001 Bradycardia, unspecified: Secondary | ICD-10-CM

## 2021-04-10 DIAGNOSIS — I350 Nonrheumatic aortic (valve) stenosis: Secondary | ICD-10-CM

## 2021-04-10 LAB — CBC WITH DIFFERENTIAL/PLATELET
Abs Immature Granulocytes: 0.01 10*3/uL (ref 0.00–0.07)
Basophils Absolute: 0 10*3/uL (ref 0.0–0.1)
Basophils Relative: 0 %
Eosinophils Absolute: 0.1 10*3/uL (ref 0.0–0.5)
Eosinophils Relative: 2 %
HCT: 31.8 % — ABNORMAL LOW (ref 39.0–52.0)
Hemoglobin: 10.5 g/dL — ABNORMAL LOW (ref 13.0–17.0)
Immature Granulocytes: 0 %
Lymphocytes Relative: 14 %
Lymphs Abs: 0.5 10*3/uL — ABNORMAL LOW (ref 0.7–4.0)
MCH: 31.8 pg (ref 26.0–34.0)
MCHC: 33 g/dL (ref 30.0–36.0)
MCV: 96.4 fL (ref 80.0–100.0)
Monocytes Absolute: 0.4 10*3/uL (ref 0.1–1.0)
Monocytes Relative: 10 %
Neutro Abs: 2.6 10*3/uL (ref 1.7–7.7)
Neutrophils Relative %: 74 %
Platelets: 105 10*3/uL — ABNORMAL LOW (ref 150–400)
RBC: 3.3 MIL/uL — ABNORMAL LOW (ref 4.22–5.81)
RDW: 15.2 % (ref 11.5–15.5)
WBC: 3.6 10*3/uL — ABNORMAL LOW (ref 4.0–10.5)
nRBC: 0 % (ref 0.0–0.2)

## 2021-04-10 LAB — GLUCOSE, CAPILLARY
Glucose-Capillary: 116 mg/dL — ABNORMAL HIGH (ref 70–99)
Glucose-Capillary: 82 mg/dL (ref 70–99)
Glucose-Capillary: 86 mg/dL (ref 70–99)
Glucose-Capillary: 91 mg/dL (ref 70–99)
Glucose-Capillary: 93 mg/dL (ref 70–99)

## 2021-04-10 LAB — ECHOCARDIOGRAM LIMITED
Height: 70 in
Weight: 1784.84 oz

## 2021-04-10 LAB — PHOSPHORUS: Phosphorus: 2.5 mg/dL (ref 2.5–4.6)

## 2021-04-10 LAB — COMPREHENSIVE METABOLIC PANEL
ALT: 13 U/L (ref 0–44)
AST: 25 U/L (ref 15–41)
Albumin: 3 g/dL — ABNORMAL LOW (ref 3.5–5.0)
Alkaline Phosphatase: 51 U/L (ref 38–126)
Anion gap: 4 — ABNORMAL LOW (ref 5–15)
BUN: 11 mg/dL (ref 8–23)
CO2: 30 mmol/L (ref 22–32)
Calcium: 8.3 mg/dL — ABNORMAL LOW (ref 8.9–10.3)
Chloride: 100 mmol/L (ref 98–111)
Creatinine, Ser: 0.61 mg/dL (ref 0.61–1.24)
GFR, Estimated: >60 mL/min (ref >60)
Glucose, Bld: 108 mg/dL — ABNORMAL HIGH (ref 70–99)
Potassium: 3.6 mmol/L (ref 3.5–5.1)
Sodium: 134 mmol/L — ABNORMAL LOW (ref 135–145)
Total Bilirubin: 0.8 mg/dL (ref 0.3–1.2)
Total Protein: 5.4 g/dL — ABNORMAL LOW (ref 6.5–8.1)

## 2021-04-10 LAB — MAGNESIUM: Magnesium: 1.8 mg/dL (ref 1.7–2.4)

## 2021-04-10 MED ORDER — PERFLUTREN LIPID MICROSPHERE
1.0000 mL | INTRAVENOUS | Status: AC | PRN
  Administered 2021-04-10: 2 mL via INTRAVENOUS
  Filled 2021-04-10: qty 10

## 2021-04-10 MED ORDER — SORBITOL 70 % SOLN
960.0000 mL | TOPICAL_OIL | Freq: Once | ORAL | Status: DC
  Filled 2021-04-10: qty 473

## 2021-04-10 MED ORDER — PHENOL 1.4 % MT LIQD
1.0000 | OROMUCOSAL | Status: DC | PRN
  Administered 2021-04-10: 1 via OROMUCOSAL
  Filled 2021-04-10: qty 177

## 2021-04-10 NOTE — Evaluation (Signed)
Occupational Therapy Evaluation Patient Details Name: [name] [name] MRN: [id number] DOB: 14-Jun-1928 Today's Date: 04/10/2021   History of Present Illness 88 yo male who presented 04/07/21 with abdominal pain and was found to have incarcerated L inguinal hernia with SBO.  Hernia reduced in ED by general surgery. PMHx significant for bilateral inguinal hernia s/p repair (left 1962, right 1980), BPH, T2DM, GERD and multiple other medical issues.   Clinical Impression   Patient is a long-term care resident at [facility] and was grossly Mod I with ADLs using SPC. Facility staff assist with IADLs including med management. Patient reports ambulating to dining hall 3x day for meals. No longer attends facility activities. Patient currently functioning below baseline demonstrating observed ADLs with Min guard to Min A overall with use of RW. Patient also limited by deficits listed below including generalized weakness, abdominal pain and gait instability and would benefit from continued acute OT services in prep for safe d/c home with [place]. OT will continue to follow acutely.        Recommendations for follow up therapy are one component of a multi-disciplinary discharge planning process, led by the attending physician.  Recommendations may be updated based on patient status, additional functional criteria and insurance authorization.   Follow Up Recommendations  Home health OT    Assistance Recommended at Discharge Frequent or constant Supervision/Assistance  Patient can return home with the following A little help with walking and/or transfers;A little help with bathing/dressing/bathroom    Functional Status Assessment  Patient has had a recent decline in their functional status and demonstrates the ability to make significant improvements in function in a reasonable and predictable amount of time.  Equipment Recommendations  None recommended by OT    Recommendations for Other Services        Precautions / Restrictions Precautions Precautions: Fall Restrictions Weight Bearing Restrictions: No      Mobility Bed Mobility Overal bed mobility: Needs Assistance Bed Mobility: Supine to Sit     Supine to sit: Min guard     General bed mobility comments: Min guard for safety and increased time.    Transfers Overall transfer level: Needs assistance Equipment used: Rolling walker (2 wheels) Transfers: Sit to/from Stand, Bed to chair/wheelchair/BSC Sit to Stand: Min guard     Step pivot transfers: Min guard     General transfer comment: Min gaurd for sit to stand from EOB positioned in lowest setting.      Balance Overall balance assessment: Needs assistance Sitting-balance support: No upper extremity supported, Feet supported Sitting balance-Leahy Scale: Fair Sitting balance - Comments: Did not challenge dynamic sitting balance.   Standing balance support: Bilateral upper extremity supported, During functional activity, Reliant on assistive device for balance Standing balance-Leahy Scale: Poor Standing balance comment: Reliant on BUE support on RW.                           ADL either performed or assessed with clinical judgement   ADL Overall ADL's : Needs assistance/impaired Eating/Feeding: Set up;Sitting   Grooming: Set up;Sitting   Upper Body Bathing: Set up;Sitting   Lower Body Bathing: Min guard;Sit to/from stand   Upper Body Dressing : Set up;Sitting   Lower Body Dressing: Min guard;Sit to/from stand   Toilet Transfer: Min guard;Rolling walker (2 wheels) Toilet Transfer Details (indicate cue type and reason): Simulated with transfer to recliner. Cues for hand placement.  Vision Ability to See in Adequate Light: 2 Moderately impaired Patient Visual Report: No change from baseline Additional Comments: Difficulty seeing number on menu to order breakfast. Does not wear reading glasses.     Perception      Praxis      Pertinent Vitals/Pain Pain Assessment Pain Assessment: 0-10 Pain Score: 5  Pain Location: Abdomen Pain Descriptors / Indicators: Aching, Sore Pain Intervention(s): Limited activity within patient's tolerance, Monitored during session, Repositioned     Hand Dominance Right   Extremity/Trunk Assessment Upper Extremity Assessment Upper Extremity Assessment: Generalized weakness   Lower Extremity Assessment Lower Extremity Assessment: Defer to PT evaluation   Cervical / Trunk Assessment Cervical / Trunk Assessment: Kyphotic   Communication Communication Communication: No difficulties   Cognition Arousal/Alertness: Awake/alert Behavior During Therapy: WFL for tasks assessed/performed Overall Cognitive Status: Within Functional Limits for tasks assessed                                       General Comments       Exercises     Shoulder Instructions      Home Living Family/patient expects to be discharged to:: Assisted living                             Home Equipment: Kasandra Knudsen - single point   Additional Comments: from [facility]      Prior Functioning/Environment Prior Level of Function : Independent/Modified Independent             Mobility Comments: ambulates with a cane, goes to 3 meals per day. ADLs Comments: Mod I for ADLs; assist with med management        OT Problem List: Decreased strength;Decreased activity tolerance;Impaired balance (sitting and/or standing);Impaired vision/perception;Pain      OT Treatment/Interventions: Self-care/ADL training;Therapeutic exercise;Energy conservation;DME and/or AE instruction;Therapeutic activities;Patient/family education;Balance training    OT Goals(Current goals can be found in the care plan section) Acute Rehab OT Goals Patient Stated Goal: To decrease pain. OT Goal Formulation: With patient Time For Goal Achievement: 04/24/21 Potential to Achieve Goals:  Good ADL Goals Pt Will Perform Grooming: standing;with modified independence Pt Will Perform Upper Body Dressing: with modified independence Pt Will Perform Lower Body Dressing: with modified independence;sit to/from stand Pt Will Transfer to Toilet: with modified independence;ambulating Pt Will Perform Toileting - Clothing Manipulation and hygiene: with modified independence;sit to/from stand Additional ADL Goal #1: Patient will tolerate 15 minutes of therapeutic activity indiating improved activity tolerance in prep for ADLs.  OT Frequency: Min 3X/week    Co-evaluation              AM-PAC OT "6 Clicks" Daily Activity     Outcome Measure Help from another person eating meals?: A Little Help from another person taking care of personal grooming?: A Little Help from another person toileting, which includes using toliet, bedpan, or urinal?: A Little Help from another person bathing (including washing, rinsing, drying)?: A Little Help from another person to put on and taking off regular upper body clothing?: A Little Help from another person to put on and taking off regular lower body clothing?: A Little 6 Click Score: 18   End of Session Equipment Utilized During Treatment: Rolling walker (2 wheels) Nurse Communication: Mobility status  Activity Tolerance: Patient tolerated treatment well Patient left: in chair;with call bell/phone within reach;with  chair alarm set  OT Visit Diagnosis: Unsteadiness on feet (R26.81);Muscle weakness (generalized) (M62.81);Pain Pain - part of body:  (abdomen)                Time: 5329-9242 OT Time Calculation (min): 15 min Charges:  OT General Charges $OT Visit: 1 Visit OT Evaluation $OT Eval Low Complexity: 1 Low  Jeovanny Cuadros H. OTR/L Supplemental OT, Department of rehab services [phone]  Dacie Mandel R H. 04/10/2021, 8:31 AM

## 2021-04-10 NOTE — Plan of Care (Signed)
°  Problem: Coping: Goal: Level of anxiety will decrease Outcome: Not Progressing   Problem: Nutrition: Goal: Adequate nutrition will be maintained Outcome: Not Progressing   Problem: Elimination: Goal: Will not experience complications related to bowel motility Outcome: Not Progressing   Problem: Elimination: Goal: Will not experience complications related to urinary retention Outcome: Not Progressing   Problem: Pain Managment: Goal: General experience of comfort will improve Outcome: Not Progressing

## 2021-04-10 NOTE — Progress Notes (Addendum)
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 Aside from rate decrease, appears largely similar to prior. 2D echo yesterday showed EF 25-30%, grade 2 DD, akinetic apex, mildly enlarged RV, moderate LAE, moderate dilation of ascending aorta 36mm, Mild AS by gradients (Vmax 2.2 m/s, MG 12 mmHg), moderate by AVA (1.4 cm^2) and DI (0.27). Given  severe systolic dysfunction, suspect low flow low gradient moderate AS. Repeat limited echo pending to eval for apical thrombus. MD to follow.  Latresa Gasser PA-C

## 2021-04-10 NOTE — Assessment & Plan Note (Signed)
Per cardiology 

## 2021-04-10 NOTE — Progress Notes (Signed)
PROGRESS NOTE    [name] [name]  BSW:[id number] DOB: 1928/08/12 DOA: 04/07/2021 PCP: Antony Contras, MD  Chief Complaint  Patient presents with   Groin Pain    Brief Narrative:  88 yo with hx bilateral inguinal hernia s/p repair (left 1962, right 1980), BPH, T2DM, GERD and multiple other medical issues who presented with abdominal pain and was found to have incarcerated L inguinal hernia with SBO.  Hernia reduced in ED by general surgery.    He's gradually improving, passing gas, but hasn't had bowel movement yet.  He's not interested in elective surgical repair at this time  See below for additional details   Assessment & Plan:   Principal Problem:   SBO (small bowel obstruction) (HCC) Active Problems:   Coronary artery disease   Left inguinal hernia   Diabetes mellitus without complication (HCC)   Acute urinary retention   Dehydration   Acute prerenal azotemia   Thrombocytopenia (HCC)   BPH (benign prostatic hyperplasia)   Biliary anomaly   Chronic systolic CHF (congestive heart failure) (HCC)   Sinus bradycardia   Aortic stenosis   Assessment and Plan: * SBO (small bowel obstruction) ([place])- (present on admission) CT showing recurrent L inguinal hernia with SBO (high grade partial or complete SBO) Inguinal hernia reduced by surgery in ED Plain film with nonobstructive pattern of bowel gas, large stool burden Appreciate surgery assistance - patient wants to avoid surgery if possible, advanced to FLD (hopefully continue to advance tomorrow), will order enema for him Cardiology was consulted for surgical clearance - awaiting EKG (brady), echo (EF 25-50%, grade II diastolic dysfunction) - no current plans for surgery now  Left inguinal hernia Reduced in ED by general surgery Appreciate further recs  Coronary artery disease Hx stent Holding plavix for now  Acute urinary retention Foley placed 2/12 flomax Trial of void 2/14 (today)  Diabetes mellitus without  complication ([place]) SSI  Thrombocytopenia ([place]) Downtrending, follow  Dehydration- (present on admission) Improved with IVF  BPH (benign prostatic hyperplasia)- (present on admission) BPH  Biliary anomaly CT with mild intra and extrahepatic biliary duct distension LFT's wnl Will follow RUQ [place] -> prominent proximal CBD, no significant dilation of intrahepatic bile duct LFT's wnl, would recommend outpatient follow up  Sinus bradycardia Per cardiology  Chronic systolic CHF (congestive heart failure) ([place]) Echo with EF 25-30%, grade II diastolic dysfunction, moderate AS, recommended limited echo with contrast to r/o apical thrombus Limited echo without LV thrombus  Aortic stenosis Moderate AV stenosis Per cardiology   DVT prophylaxis: SCD Code Status: full Family Communication: none Disposition:   Status is: Inpatient Remains inpatient appropriate because: need for surgery eval   Consultants:  General surgery  Procedures:  Echo IMPRESSIONS     1. Left ventricular ejection fraction, by estimation, is 25 to 30%. The  left ventricle has severely decreased function. The left ventricle  demonstrates regional wall motion abnormalities (see scoring  diagram/findings for description). There is mild left   ventricular hypertrophy. Left ventricular diastolic parameters are  consistent with Grade II diastolic dysfunction (pseudonormalization).  Elevated left atrial pressure.   2. Akinetic apex not well visualized, recommend limited echo with  contrast to rule out apical thrombus   3. Right ventricular systolic function is normal. The right ventricular  size is mildly enlarged.   4. Left atrial size was moderately dilated.   5. Right atrial size was mildly dilated.   6. The mitral valve is grossly normal. Trivial mitral valve  regurgitation. No  evidence of mitral stenosis.   7. Aortic dilatation noted. There is dilatation of the ascending aorta,  measuring 44 mm.   8.  The aortic valve is calcified. There is severe calcifcation of the  aortic valve. Aortic valve regurgitation is trivial. Moderate aortic valve  stenosis. Mild AS by gradients (Vmax 2.2 m/s, MG 12 mmHg), moderate by AVA  (1.4 cm^2) and DI (0.27). Given  severe systolic dysfunction, suspect low flow low gradient moderate AS   Echo IMPRESSIONS     1. Limited study to exclude LV thrombus. No LV thrombus on contrast  imaging. 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 Left ventricular ejection fraction, by  estimation, is 20 to 25%. The left ventricle has severely decreased  function.   Antimicrobials:  Anti-infectives (From admission, onward)    None       Subjective: No new complaints today  Objective: Vitals:   04/09/21 2114 04/10/21 0207 04/10/21 0537 04/10/21 1348  BP: (!) 100/54  101/61 116/60  Pulse: (!) 51 (!) 56 (!) 59 (!) 52  Resp: 20  18 15   Temp: 98 F (36.7 C)  (!) 97.3 F (36.3 C) 99.1 F (37.3 C)  TempSrc: Oral  Oral   SpO2: 100% 98% 98% 100%  Weight:   50.6 kg   Height:        Intake/Output Summary (Last 24 hours) at 04/10/2021 1741 Last data filed at 04/10/2021 1721 Gross per 24 hour  Intake 1336.03 ml  Output 3500 ml  Net -2163.97 ml   Filed Weights   04/09/21 0700 04/10/21 0537  Weight: 82 kg 50.6 kg    Examination:  General: No acute distress. Cardiovascular: RRR Lungs: unlabored Abdomen: Soft, nontender, nondistended  Neurological: Alert and oriented 3. Moves all extremities 4. Cranial nerves II through XII grossly intact. Skin: Warm and dry. No rashes or lesions. Extremities: No clubbing or cyanosis. No edema.    Data Reviewed: I have personally reviewed following labs and imaging studies  CBC: Recent Labs  Lab 04/07/21 1400 04/08/21 0416 04/09/21 0354 04/10/21 0436  WBC 2.9* 4.5 3.5* 3.6*  NEUTROABS 2.2 3.5 2.4 2.6  HGB 11.8* 10.3* 10.1* 10.5*  HCT 35.5* 31.6* 31.0* 31.8*  MCV 94.2 95.8 96.6 96.4  PLT 127* 115* 105* 105*    Basic  Metabolic Panel: Recent Labs  Lab 04/07/21 1400 04/08/21 0416 04/09/21 0354 04/10/21 0436  NA 137 137 136 134*  K 4.0 4.6 4.0 3.6  CL 100 103 104 100  CO2 30 30 29 30   GLUCOSE 143* 88 77 108*  BUN 18 16 15 11   CREATININE 0.70 0.73 0.55* 0.61  CALCIUM 9.1 8.7* 8.4* 8.3*  MG  --  2.0   2.1 1.9 1.8  PHOS  --   --  3.1 2.5    GFR: Estimated Creatinine Clearance: 43.9 mL/min (by C-G formula based on SCr of 0.61 mg/dL).  Liver Function Tests: Recent Labs  Lab 04/07/21 1400 04/08/21 0416 04/09/21 0354 04/10/21 0436  AST 17 18 24 25   ALT 11 11 10 13   ALKPHOS 64 53 47 51  BILITOT 0.7 0.5 1.0 0.8  PROT 6.8 5.9* 5.3* 5.4*  ALBUMIN 4.0 3.4* 3.2* 3.0*    CBG: Recent Labs  Lab 04/09/21 1830 04/10/21 0025 04/10/21 0535 04/10/21 1211 04/10/21 1739  GLUCAP 127* 91 93 116* 82     Recent Results (from the past 240 hour(s))  Resp Panel by RT-PCR (Flu Tana Trefry&B, Covid) Nasopharyngeal Swab     Status: None   Collection  Time: 04/07/21  9:37 PM   Specimen: Nasopharyngeal Swab; Nasopharyngeal(NP) swabs in vial transport medium  Result Value Ref Range Status   SARS Coronavirus 2 by RT PCR NEGATIVE NEGATIVE Final    Comment: (NOTE) SARS-CoV-2 target nucleic acids are NOT DETECTED.  The SARS-CoV-2 RNA is generally detectable in upper respiratory specimens during the acute phase of infection. The lowest concentration of SARS-CoV-2 viral copies this assay can detect is 138 copies/mL. Zuzanna Maroney negative result does not preclude SARS-Cov-2 infection and should not be used as the sole basis for treatment or other patient management decisions. Leala Bryand negative result may occur with  improper specimen collection/handling, submission of specimen other than nasopharyngeal swab, presence of viral mutation(s) within the areas targeted by this assay, and inadequate number of viral copies(<138 copies/mL). Sherman Lipuma negative result must be combined with clinical observations, patient history, and  epidemiological information. The expected result is Negative.  Fact Sheet for Patients:  EntrepreneurPulse.com.au  Fact Sheet for Healthcare Providers:  IncredibleEmployment.be  This test is no t yet approved or cleared by the [place] FDA and  has been authorized for detection and/or diagnosis of SARS-CoV-2 by FDA under an Emergency Use Authorization (EUA). This EUA will remain  in effect (meaning this test can be used) for the duration of the COVID-19 declaration under Section 564(b)(1) of the Act, 21 U.S.C.section 360bbb-3(b)(1), unless the authorization is terminated  or revoked sooner.       Influenza Reather Steller by PCR NEGATIVE NEGATIVE Final   Influenza B by PCR NEGATIVE NEGATIVE Final    Comment: (NOTE) The Xpert Xpress SARS-CoV-2/FLU/RSV plus assay is intended as an aid in the diagnosis of influenza from Nasopharyngeal swab specimens and should not be used as Danniela Mcbrearty sole basis for treatment. Nasal washings and aspirates are unacceptable for Xpert Xpress SARS-CoV-2/FLU/RSV testing.  Fact Sheet for Patients: EntrepreneurPulse.com.au  Fact Sheet for Healthcare Providers: IncredibleEmployment.be  This test is not yet approved or cleared by the [place] FDA and has been authorized for detection and/or diagnosis of SARS-CoV-2 by FDA under an Emergency Use Authorization (EUA). This EUA will remain in effect (meaning this test can be used) for the duration of the COVID-19 declaration under Section 564(b)(1) of the Act, 21 U.S.C. section 360bbb-3(b)(1), unless the authorization is terminated or revoked.  Performed at [facility], [place] [address], [place],  [address]          Radiology Studies: ECHOCARDIOGRAM COMPLETE  Result Date: 04/09/2021    ECHOCARDIOGRAM REPORT   Patient Name:   [name] [name] Date of Exam: 04/09/2021 Medical Rec #:  [id number]      Height:       70.0 in  Accession #:    [id number]     Weight:       180.8 lb Date of Birth:  1928/07/08      BSA:          2.000 m Patient Age:    13 years       BP:           101/56 mmHg Patient Gender: M              HR:           67 bpm. Exam Location:  Inpatient Procedure: 2D Echo, Cardiac Doppler and Color Doppler Indications:    CM  History:        Patient has no prior history of Echocardiogram examinations.  CHF, CAD; Risk Factors:Hypertension and Diabetes.  Sonographer:    Luisa Hart RDCS Referring Phys: [place]  1. Left ventricular ejection fraction, by estimation, is 25 to 30%. The left ventricle has severely decreased function. The left ventricle demonstrates regional wall motion abnormalities (see scoring diagram/findings for description). There is mild left  ventricular hypertrophy. Left ventricular diastolic parameters are consistent with Grade II diastolic dysfunction (pseudonormalization). Elevated left atrial pressure.  2. Akinetic apex not well visualized, recommend limited echo with contrast to rule out apical thrombus  3. Right ventricular systolic function is normal. The right ventricular size is mildly enlarged.  4. Left atrial size was moderately dilated.  5. Right atrial size was mildly dilated.  6. The mitral valve is grossly normal. Trivial mitral valve regurgitation. No evidence of mitral stenosis.  7. Aortic dilatation noted. There is dilatation of the ascending aorta, measuring 44 mm.  8. The aortic valve is calcified. There is severe calcifcation of the aortic valve. Aortic valve regurgitation is trivial. Moderate aortic valve stenosis. Mild AS by gradients (Vmax 2.2 m/s, MG 12 mmHg), moderate by AVA (1.4 cm^2) and DI (0.27). Given severe systolic dysfunction, suspect low flow low gradient moderate AS FINDINGS  Left Ventricle: Left ventricular ejection fraction, by estimation, is 25 to 30%. The left ventricle has severely decreased function. The left ventricle  demonstrates regional wall motion abnormalities. The left ventricular internal cavity size was normal  in size. There is mild left ventricular hypertrophy. Left ventricular diastolic parameters are consistent with Grade II diastolic dysfunction (pseudonormalization). Elevated left atrial pressure.  LV Wall Scoring: The mid and distal anterior wall, mid and distal anterior septum, entire apex, mid and distal inferior wall, and mid inferoseptal segment are akinetic. The mid inferolateral segment and mid anterolateral segment are hypokinetic. The basal anteroseptal segment, basal inferolateral segment, basal anterolateral segment, basal anterior segment, basal inferior segment, and basal inferoseptal segment are normal. Right Ventricle: The right ventricular size is mildly enlarged. Right vetricular wall thickness was not well visualized. Right ventricular systolic function is normal. Left Atrium: Left atrial size was moderately dilated. Right Atrium: Right atrial size was mildly dilated. Pericardium: Trivial pericardial effusion is present. Mitral Valve: The mitral valve is grossly normal. Trivial mitral valve regurgitation. No evidence of mitral valve stenosis. MV peak gradient, 5.2 mmHg. The mean mitral valve gradient is 2.0 mmHg. Tricuspid Valve: The tricuspid valve is normal in structure. Tricuspid valve regurgitation is trivial. Aortic Valve: The aortic valve is calcified. There is severe calcifcation of the aortic valve. Aortic valve regurgitation is trivial. Aortic regurgitation PHT measures 555 msec. Mild to moderate aortic stenosis is present. Aortic valve mean gradient measures 11.5 mmHg. Aortic valve peak gradient measures 19.0 mmHg. Aortic valve area, by VTI measures 1.68 cm. Pulmonic Valve: The pulmonic valve was not well visualized. Pulmonic valve regurgitation is trivial. Aorta: Aortic dilatation noted. There is dilatation of the ascending aorta, measuring 44 mm. IAS/Shunts: The interatrial septum was  not well visualized.  LEFT VENTRICLE PLAX 2D LVIDd:         5.50 cm      Diastology LVIDs:         4.00 cm      LV e' medial:    2.39 cm/s LV PW:         1.00 cm      LV E/e' medial:  32.3 LV IVS:        1.10 cm      LV e'  lateral:   4.57 cm/s LVOT diam:     2.60 cm      LV E/e' lateral: 16.9 LV SV:         94 LV SV Index:   47 LVOT Area:     5.31 cm  LV Volumes (MOD) LV vol d, MOD A2C: 204.0 ml LV vol d, MOD A4C: 234.0 ml LV vol s, MOD A2C: 125.0 ml LV vol s, MOD A4C: 130.0 ml LV SV MOD A2C:     79.0 ml LV SV MOD A4C:     234.0 ml LV SV MOD BP:      96.1 ml RIGHT VENTRICLE RV Basal diam:  4.60 cm RV Mid diam:    3.00 cm TAPSE (M-mode): 2.6 cm LEFT ATRIUM             Index        RIGHT ATRIUM           Index LA diam:        3.50 cm 1.75 cm/m   RA Area:     17.90 cm LA Vol (A2C):   78.0 ml 39.01 ml/m  RA Volume:   44.30 ml  22.15 ml/m LA Vol (A4C):   97.3 ml 48.66 ml/m LA Biplane Vol: 89.0 ml 44.51 ml/m  AORTIC VALVE                     PULMONIC VALVE AV Area (Vmax):    1.65 cm      PV Vmax:          0.85 m/s AV Area (Vmean):   1.50 cm      PV Vmean:         56.000 cm/s AV Area (VTI):     1.68 cm      PV VTI:           0.166 m AV Vmax:           218.00 cm/s   PV Peak grad:     2.9 mmHg AV Vmean:          158.000 cm/s  PV Mean grad:     2.0 mmHg AV VTI:            0.562 m       PR End Diast Vel: 4.24 msec AV Peak Grad:      19.0 mmHg AV Mean Grad:      11.5 mmHg LVOT Vmax:         67.55 cm/s LVOT Vmean:        44.500 cm/s LVOT VTI:          0.178 m LVOT/AV VTI ratio: 0.32 AI PHT:            555 msec  AORTA Ao Root diam: 3.40 cm Ao Asc diam:  4.40 cm MITRAL VALVE                TRICUSPID VALVE MV Area (PHT): 4.06 cm     TR Peak grad:   22.5 mmHg MV Area VTI:   2.45 cm     TR Vmax:        237.00 cm/s MV Peak grad:  5.2 mmHg MV Mean grad:  2.0 mmHg     SHUNTS MV Vmax:       1.14 m/s     Systemic VTI:  0.18 m MV Vmean:      55.8 cm/s    Systemic Diam: 2.60 cm MV Decel  Time: 187 msec MR Peak grad: 93.3 mmHg MR  Mean grad: 50.0 mmHg MR Vmax:      483.00 cm/s MR Vmean:     324.0 cm/s MV E velocity: 77.10 cm/s MV Sorah Falkenstein velocity: 103.00 cm/s MV E/Katielynn Horan ratio:  0.75 Oswaldo Milian MD Electronically signed by Oswaldo Milian MD Signature Date/Time: 04/09/2021/5:31:50 PM    Final    ECHOCARDIOGRAM LIMITED  Result Date: 04/10/2021    ECHOCARDIOGRAM LIMITED REPORT   Patient Name:   [name] [name] Date of Exam: 04/10/2021 Medical Rec #:  [id number]      Height:       70.0 in Accession #:    [id number]     Weight:       111.6 lb Date of Birth:  1929/01/17      BSA:          1.629 m Patient Age:    58 years       BP:           101/61 mmHg Patient Gender: M              HR:           67 bpm. Exam Location:  Inpatient Procedure: Intracardiac Opacification Agent and Limited Echo Indications:    R/O apical thrombus  History:        Patient has prior history of Echocardiogram examinations, most                 recent 04/09/2021.  Sonographer:    Luisa Hart RDCS Referring Phys: [phone] Torie Towle CALDWELL POWELL [place]  1. Limited study to exclude LV thrombus. No LV thrombus on contrast imaging. 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 Left ventricular ejection fraction, by estimation, is 20 to 25%. The left ventricle has severely decreased function. FINDINGS  Left Ventricle: Limited study to exclude LV thrombus. No LV thrombus on contrast imaging. 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 Left ventricular ejection fraction, by estimation, is 20 to 25%. The left ventricle has severely decreased function.  LV Wall Scoring: The mid and distal anterior septum, entire apex, and mid inferoseptal segment are akinetic. Eleonore Chiquito MD Electronically signed by Eleonore Chiquito MD Signature Date/Time: 04/10/2021/1:24:42 PM    Final         Scheduled Meds:  Chlorhexidine Gluconate Cloth  6 each Topical Daily   feeding supplement  237 mL Oral TID WC   insulin aspart  0-6 Units Subcutaneous Q6H   polyethylene glycol  17 g Oral BID   sorbitol, milk of mag, mineral oil, glycerin  (SMOG) enema  960 mL Rectal Once   tamsulosin  0.4 mg Oral Daily   Continuous Infusions:  dextrose 5% lactated ringers 75 mL/hr at 04/10/21 1154     LOS: 3 days    Time spent: over 30 min    Fayrene Helper, MD [facility]   To contact the attending provider between 7A-7P or the covering provider during after hours 7P-7A, please log into the web site [email or web address] and access using universal [place] password for that web site. If you do not have the password, please call the hospital operator.  04/10/2021, 5:41 PM

## 2021-04-10 NOTE — Progress Notes (Addendum)
Patient has called to be assisted to pee multiple time since 1900, scant amount noted  every after attempt, patient is verbalizing discomfort already, bladder scan performed and noted 244 mls, paged [name] Plowman, NP to update.  At 2310, in out performed, 400 mls of urine noted, 45 mls residual urine noted via bladder scan.

## 2021-04-10 NOTE — Progress Notes (Signed)
Physical Therapy Treatment Patient Details Name: [name] [name] MRN: [id number] DOB: October 02, 1928 Today's Date: 04/10/2021   History of Present Illness 88 yo male who presented 04/07/21 with abdominal pain and was found to have incarcerated L inguinal hernia with SBO.  Hernia reduced in ED by general surgery. PMHx significant for bilateral inguinal hernia s/p repair (left 1962, right 1980), BPH, T2DM, GERD and multiple other medical issues.    PT Comments    Pt assisted with ambulating in hallway.  Pt denies any pain in abdomen however did reports left low back pain upon returning to bed and RN notified.    Recommendations for follow up therapy are one component of a multi-disciplinary discharge planning process, led by the attending physician.  Recommendations may be updated based on patient status, additional functional criteria and insurance authorization.  Follow Up Recommendations  Home health PT     Assistance Recommended at Discharge Intermittent Supervision/Assistance  Patient can return home with the following A little help with walking and/or transfers;A little help with bathing/dressing/bathroom;Assistance with cooking/housework;Direct supervision/assist for medications management;Assist for transportation;Direct supervision/assist for financial management   Equipment Recommendations  None recommended by PT    Recommendations for Other Services       Precautions / Restrictions Precautions Precautions: Fall     Mobility  Bed Mobility Overal bed mobility: Needs Assistance Bed Mobility: Rolling, Sidelying to Sit, Sit to Supine Rolling: Min guard Sidelying to sit: Min guard   Sit to supine: Min guard   General bed mobility comments: provided cues for log roll technique due to hernia (which was reduced)    Transfers Overall transfer level: Needs assistance Equipment used: Rolling walker (2 wheels) Transfers: Sit to/from Stand Sit to Stand: Min guard            General transfer comment: cues for hand placement    Ambulation/Gait Ambulation/Gait assistance: Min guard Gait Distance (Feet): 240 Feet Assistive device: Rolling walker (2 wheels) Gait Pattern/deviations: Step-through pattern, Decreased stride length       General Gait Details: verbal cues for posture, pt steady with RW, denies dizziness   Stairs             Wheelchair Mobility    Modified Rankin (Stroke Patients Only)       Balance                                            Cognition Arousal/Alertness: Awake/alert Behavior During Therapy: WFL for tasks assessed/performed Overall Cognitive Status: Within Functional Limits for tasks assessed                                          Exercises      General Comments        Pertinent Vitals/Pain Pain Assessment Pain Assessment: 0-10 Pain Score: 8  Pain Location: left lower back ("feels like when I had kidney stones") - RN notified Pain Descriptors / Indicators: Sore, Sharp Pain Intervention(s): Repositioned, Monitored during session    Home Living                          Prior Function            PT Goals (current goals can now be found  in the care plan section) Progress towards PT goals: Progressing toward goals    Frequency    Min 3X/week      PT Plan Current plan remains appropriate    Co-evaluation              AM-PAC PT "6 Clicks" Mobility   Outcome Measure  Help needed turning from your back to your side while in a flat bed without using bedrails?: A Little Help needed moving from lying on your back to sitting on the side of a flat bed without using bedrails?: A Little Help needed moving to and from a bed to a chair (including a wheelchair)?: A Little Help needed standing up from a chair using your arms (e.g., wheelchair or bedside chair)?: A Little Help needed to walk in hospital room?: A Little Help needed climbing 3-5 steps  with a railing? : A Lot 6 Click Score: 17    End of Session Equipment Utilized During Treatment: Gait belt Activity Tolerance: Patient tolerated treatment well Patient left: in bed;with call bell/phone within reach;with bed alarm set;with nursing/sitter in room Nurse Communication: Mobility status PT Visit Diagnosis: Difficulty in walking, not elsewhere classified (R26.2)     Time: 1140-1159 PT Time Calculation (min) (ACUTE ONLY): 19 min  Charges:  $Gait Training: 8-22 mins                     Arlyce Dice, DPT Acute Rehabilitation Services Pager: [phone] Office: [place] 04/10/2021, 12:48 PM

## 2021-04-10 NOTE — Assessment & Plan Note (Signed)
Echo with EF 25-30%, grade II diastolic dysfunction, moderate AS, recommended limited echo with contrast to r/o apical thrombus Limited echo without LV thrombus

## 2021-04-10 NOTE — Progress Notes (Addendum)
Subjective/Chief Complaint: NAEO. Denies pain. Denies BM.  Objective: Vital signs in last 24 hours: Temp:  [97.3 F (36.3 C)-98.2 F (36.8 C)] 97.3 F (36.3 C) (02/14 0537) Pulse Rate:  [51-59] 59 (02/14 0537) Resp:  [18-20] 18 (02/14 0537) BP: (100-108)/(54-61) 101/61 (02/14 0537) SpO2:  [94 %-100 %] 98 % (02/14 0537) Weight:  [50.6 kg] 50.6 kg (02/14 0537) Last BM Date : 04/07/21  Intake/Output from previous day: 02/13 0701 - 02/14 0700 In: 1992.7 [P.O.:236; I.V.:1756.7] Out: 1900 [Urine:1900] Intake/Output this shift: Total I/O In: -  Out: 600 [Urine:600]  General appearance: alert, no acute distress, diffuse muscle wasting Resp: breathing comfortably GI: soft, non distended Male genitalia: left groin WITHOUT evidence of recurrence.   Psych: A&Ox3, flat affect   Lab Results:  Recent Labs    04/09/21 0354 04/10/21 0436  WBC 3.5* 3.6*  HGB 10.1* 10.5*  HCT 31.0* 31.8*  PLT 105* 105*   BMET Recent Labs    04/09/21 0354 04/10/21 0436  NA 136 134*  K 4.0 3.6  CL 104 100  CO2 29 30  GLUCOSE 77 108*  BUN 15 11  CREATININE 0.55* 0.61  CALCIUM 8.4* 8.3*   PT/INR Recent Labs    04/08/21 0416  LABPROT 15.0  INR 1.2   ABG No results for input(s): PHART, HCO3 in the last 72 hours.  Invalid input(s): PCO2, PO2  Studies/Results: ECHOCARDIOGRAM COMPLETE  Result Date: 04/09/2021    ECHOCARDIOGRAM REPORT   Patient Name:   [name] [name] Date of Exam: 04/09/2021 Medical Rec #:  [id number]      Height:       70.0 in Accession #:    [id number]     Weight:       180.8 lb Date of Birth:  23-Sep-1928      BSA:          2.000 m Patient Age:    88 years       BP:           101/56 mmHg Patient Gender: M              HR:           67 bpm. Exam Location:  Inpatient Procedure: 2D Echo, Cardiac Doppler and Color Doppler Indications:    CM  History:        Patient has no prior history of Echocardiogram examinations.                 CHF, CAD; Risk Factors:Hypertension and  Diabetes.  Sonographer:    Luisa Hart RDCS Referring Phys: [place]  1. Left ventricular ejection fraction, by estimation, is 25 to 30%. The left ventricle has severely decreased function. The left ventricle demonstrates regional wall motion abnormalities (see scoring diagram/findings for description). There is mild left  ventricular hypertrophy. Left ventricular diastolic parameters are consistent with Grade II diastolic dysfunction (pseudonormalization). Elevated left atrial pressure.  2. Akinetic apex not well visualized, recommend limited echo with contrast to rule out apical thrombus  3. Right ventricular systolic function is normal. The right ventricular size is mildly enlarged.  4. Left atrial size was moderately dilated.  5. Right atrial size was mildly dilated.  6. The mitral valve is grossly normal. Trivial mitral valve regurgitation. No evidence of mitral stenosis.  7. Aortic dilatation noted. There is dilatation of the ascending aorta, measuring 44 mm.  8. The aortic valve is calcified. There is severe calcifcation of the  aortic valve. Aortic valve regurgitation is trivial. Moderate aortic valve stenosis. Mild AS by gradients (Vmax 2.2 m/s, MG 12 mmHg), moderate by AVA (1.4 cm^2) and DI (0.27). Given severe systolic dysfunction, suspect low flow low gradient moderate AS FINDINGS  Left Ventricle: Left ventricular ejection fraction, by estimation, is 25 to 30%. The left ventricle has severely decreased function. The left ventricle demonstrates regional wall motion abnormalities. The left ventricular internal cavity size was normal  in size. There is mild left ventricular hypertrophy. Left ventricular diastolic parameters are consistent with Grade II diastolic dysfunction (pseudonormalization). Elevated left atrial pressure.  LV Wall Scoring: The mid and distal anterior wall, mid and distal anterior septum, entire apex, mid and distal inferior wall, and mid inferoseptal segment are  akinetic. The mid inferolateral segment and mid anterolateral segment are hypokinetic. The basal anteroseptal segment, basal inferolateral segment, basal anterolateral segment, basal anterior segment, basal inferior segment, and basal inferoseptal segment are normal. Right Ventricle: The right ventricular size is mildly enlarged. Right vetricular wall thickness was not well visualized. Right ventricular systolic function is normal. Left Atrium: Left atrial size was moderately dilated. Right Atrium: Right atrial size was mildly dilated. Pericardium: Trivial pericardial effusion is present. Mitral Valve: The mitral valve is grossly normal. Trivial mitral valve regurgitation. No evidence of mitral valve stenosis. MV peak gradient, 5.2 mmHg. The mean mitral valve gradient is 2.0 mmHg. Tricuspid Valve: The tricuspid valve is normal in structure. Tricuspid valve regurgitation is trivial. Aortic Valve: The aortic valve is calcified. There is severe calcifcation of the aortic valve. Aortic valve regurgitation is trivial. Aortic regurgitation PHT measures 555 msec. Mild to moderate aortic stenosis is present. Aortic valve mean gradient measures 11.5 mmHg. Aortic valve peak gradient measures 19.0 mmHg. Aortic valve area, by VTI measures 1.68 cm. Pulmonic Valve: The pulmonic valve was not well visualized. Pulmonic valve regurgitation is trivial. Aorta: Aortic dilatation noted. There is dilatation of the ascending aorta, measuring 44 mm. IAS/Shunts: The interatrial septum was not well visualized.  LEFT VENTRICLE PLAX 2D LVIDd:         5.50 cm      Diastology LVIDs:         4.00 cm      LV e' medial:    2.39 cm/s LV PW:         1.00 cm      LV E/e' medial:  32.3 LV IVS:        1.10 cm      LV e' lateral:   4.57 cm/s LVOT diam:     2.60 cm      LV E/e' lateral: 16.9 LV SV:         94 LV SV Index:   47 LVOT Area:     5.31 cm  LV Volumes (MOD) LV vol d, MOD A2C: 204.0 ml LV vol d, MOD A4C: 234.0 ml LV vol s, MOD A2C: 125.0 ml LV  vol s, MOD A4C: 130.0 ml LV SV MOD A2C:     79.0 ml LV SV MOD A4C:     234.0 ml LV SV MOD BP:      96.1 ml RIGHT VENTRICLE RV Basal diam:  4.60 cm RV Mid diam:    3.00 cm TAPSE (M-mode): 2.6 cm LEFT ATRIUM             Index        RIGHT ATRIUM           Index LA diam:  3.50 cm 1.75 cm/m   RA Area:     17.90 cm LA Vol (A2C):   78.0 ml 39.01 ml/m  RA Volume:   44.30 ml  22.15 ml/m LA Vol (A4C):   97.3 ml 48.66 ml/m LA Biplane Vol: 89.0 ml 44.51 ml/m  AORTIC VALVE                     PULMONIC VALVE AV Area (Vmax):    1.65 cm      PV Vmax:          0.85 m/s AV Area (Vmean):   1.50 cm      PV Vmean:         56.000 cm/s AV Area (VTI):     1.68 cm      PV VTI:           0.166 m AV Vmax:           218.00 cm/s   PV Peak grad:     2.9 mmHg AV Vmean:          158.000 cm/s  PV Mean grad:     2.0 mmHg AV VTI:            0.562 m       PR End Diast Vel: 4.24 msec AV Peak Grad:      19.0 mmHg AV Mean Grad:      11.5 mmHg LVOT Vmax:         67.55 cm/s LVOT Vmean:        44.500 cm/s LVOT VTI:          0.178 m LVOT/AV VTI ratio: 0.32 AI PHT:            555 msec  AORTA Ao Root diam: 3.40 cm Ao Asc diam:  4.40 cm MITRAL VALVE                TRICUSPID VALVE MV Area (PHT): 4.06 cm     TR Peak grad:   22.5 mmHg MV Area VTI:   2.45 cm     TR Vmax:        237.00 cm/s MV Peak grad:  5.2 mmHg MV Mean grad:  2.0 mmHg     SHUNTS MV Vmax:       1.14 m/s     Systemic VTI:  0.18 m MV Vmean:      55.8 cm/s    Systemic Diam: 2.60 cm MV Decel Time: 187 msec MR Peak grad: 93.3 mmHg MR Mean grad: 50.0 mmHg MR Vmax:      483.00 cm/s MR Vmean:     324.0 cm/s MV E velocity: 77.10 cm/s MV A velocity: 103.00 cm/s MV E/A ratio:  0.75 Oswaldo Milian MD Electronically signed by Oswaldo Milian MD Signature Date/Time: 04/09/2021/5:31:50 PM    Final    US Abdomen Limited RUQ (LIVER/GB)  Result Date: 04/08/2021 CLINICAL DATA:  Bile duct dilation seen in the previous CT EXAM: ULTRASOUND ABDOMEN LIMITED RIGHT UPPER QUADRANT COMPARISON:   CT done on 04/07/2021 FINDINGS: Gallbladder: There are echogenic foci in the lumen of nondistended gallbladder suggesting gallstones. Technologist did not observe any tenderness over the gallbladder. There is no fluid around the gallbladder. Common bile duct: Proximal common bile duct measures 7.3 mm in diameter without definite intrahepatic duct dilation. Distal common bile duct is not adequately visualized for evaluation. Liver: There is coarsening of echoes in the liver. There is possible mild nodularity in the liver surface.  No focal abnormality is seen. Portal vein is patent on color Doppler imaging with normal direction of blood flow towards the liver. Other: None. IMPRESSION: Gallbladder stones. There are no sonographic signs of acute cholecystitis. Proximal common bile duct is prominent measuring 7.3 mm. There is no significant dilation of intrahepatic bile ducts. Distal common bile duct is not adequately visualized limiting evaluation for choledocholithiasis. There is coarsening of echoes in the liver which may be due to fatty infiltration or cirrhosis. No focal abnormality is seen in the visualized portions of liver. Electronically Signed   By: Elmer Picker M.D.   On: 04/08/2021 16:45    Anti-infectives: Anti-infectives (From admission, onward)    None       Assessment/Plan: Incarcerated recurrent left inguinal hernia- reduced, no signs of recurrence but also has not been OOB. SBO- related to above. KUB 2/12 with non-obstructive pattern. no n/v, some flatus. ADAT to soft Constipation - large stool burden on KUB. refusing laxatives, continue to encourage miralax, prune juice, mobilization. Patient agrees to enema today after breakfast.  Patient would like to avoid surgery and so far does not have a hernia recurrence or signs of obstruction. Cardiology saw the patient yesterday 2/13 - see their note for detailed evaluation. He is not particularly high risk from CV standpoint but given  his age, protein-calorie malnutrition, and history of CAD and CM, he is at least moderate risk. Ok to resume plavix.   Dr. Barry Dienes reviewed the patients plan of care and discussed surgery with the patients nephew 2/12. See her note.   LOS: 3 days   Moderate decision making   Obie Dredge, [facility], [place]

## 2021-04-10 NOTE — Progress Notes (Signed)
Chaplain engaged in an initial visit with [name] [name].  [name] was having a hard time with his catheter and had accidentally pulled on his IV.  Nurse was alerted.  Chaplain wanted to provide [name] with some time to be cleaned up and made more comfortable.  Chaplain will follow-up later today.   Chaplain offered support.     04/10/21 1100  Clinical Encounter Type  Visited With Patient  Visit Type Social support;Initial

## 2021-04-10 NOTE — Progress Notes (Signed)
Patient removed IV, spoke with Dr. Florene Glen and he stated that it was okay to leave out at this time

## 2021-04-10 NOTE — Assessment & Plan Note (Addendum)
Moderate AV stenosis Per cardiology

## 2021-04-10 NOTE — Plan of Care (Signed)
°  Problem: Elimination: Goal: Will not experience complications related to bowel motility Outcome: Not Progressing   Problem: Pain Managment: Goal: General experience of comfort will improve Outcome: Not Progressing   Problem: Nutrition: Goal: Adequate nutrition will be maintained Outcome: Not Progressing   

## 2021-04-10 NOTE — Progress Notes (Signed)
Progress Note  Patient Name: [name] [name] Date of Encounter: 04/10/2021  Primary Cardiologist:   None   Subjective   He denies any chest pain or SOB.    Inpatient Medications    Scheduled Meds:  Chlorhexidine Gluconate Cloth  6 each Topical Daily   feeding supplement  237 mL Oral TID WC   insulin aspart  0-6 Units Subcutaneous Q6H   polyethylene glycol  17 g Oral BID   tamsulosin  0.4 mg Oral Daily   Continuous Infusions:  dextrose 5% lactated ringers 75 mL/hr at 04/10/21 1154   PRN Meds: acetaminophen **OR** acetaminophen, fentaNYL (SUBLIMAZE) injection, naLOXone (NARCAN)  injection, ondansetron (ZOFRAN) IV, perflutren lipid microspheres (DEFINITY) IV suspension, phenol   Vital Signs    Vitals:   04/09/21 1438 04/09/21 2114 04/10/21 0207 04/10/21 0537  BP: (!) 108/58 (!) 100/54  101/61  Pulse: (!) 51 (!) 51 (!) 56 (!) 59  Resp: 18 20  18   Temp: 98.2 F (36.8 C) 98 F (36.7 C)  (!) 97.3 F (36.3 C)  TempSrc: Oral Oral  Oral  SpO2: 94% 100% 98% 98%  Weight:    50.6 kg  Height:        Intake/Output Summary (Last 24 hours) at 04/10/2021 1301 Last data filed at 04/10/2021 1216 Gross per 24 hour  Intake 1992.68 ml  Output 3200 ml  Net -1207.32 ml   Filed Weights   04/09/21 0700 04/10/21 0537  Weight: 82 kg 50.6 kg    Telemetry    NA - Personally Reviewed  ECG    SB, premature ectopic complexes, IVCD - Personally Reviewed  Physical Exam   GEN: No acute distress.   Neck: No  JVD Cardiac: RRR, no murmurs, rubs, or gallops.  Respiratory: Clear to auscultation bilaterally. GI: Soft, nontender, non-distended  MS: No edema; No deformity. Neuro:  Nonfocal  Psych: Normal affect   Labs    Chemistry Recent Labs  Lab 04/08/21 0416 04/09/21 0354 04/10/21 0436  NA 137 136 134*  K 4.6 4.0 3.6  CL 103 104 100  CO2 30 29 30   GLUCOSE 88 77 108*  BUN 16 15 11   CREATININE 0.73 0.55* 0.61  CALCIUM 8.7* 8.4* 8.3*  PROT 5.9* 5.3* 5.4*  ALBUMIN 3.4*  3.2* 3.0*  AST 18 24 25   ALT 11 10 13   ALKPHOS 53 47 51  BILITOT 0.5 1.0 0.8  GFRNONAA >60 >60 >60  ANIONGAP 4* 3* 4*     Hematology Recent Labs  Lab 04/08/21 0416 04/09/21 0354 04/10/21 0436  WBC 4.5 3.5* 3.6*  RBC 3.30* 3.21* 3.30*  HGB 10.3* 10.1* 10.5*  HCT 31.6* 31.0* 31.8*  MCV 95.8 96.6 96.4  MCH 31.2 31.5 31.8  MCHC 32.6 32.6 33.0  RDW 15.8* 15.7* 15.2  PLT 115* 105* 105*    Cardiac EnzymesNo results for input(s): TROPONINI in the last 168 hours. No results for input(s): TROPIPOC in the last 168 hours.   BNPNo results for input(s): BNP, PROBNP in the last 168 hours.   DDimer No results for input(s): DDIMER in the last 168 hours.   Radiology    ECHOCARDIOGRAM COMPLETE  Result Date: 04/09/2021    ECHOCARDIOGRAM REPORT   Patient Name:   [name] [name] Date of Exam: 04/09/2021 Medical Rec #:  [id number]      Height:       70.0 in Accession #:    [id number]     Weight:       180.8  lb Date of Birth:  05/09/1928      BSA:          2.000 m Patient Age:    56 years       BP:           101/56 mmHg Patient Gender: M              HR:           67 bpm. Exam Location:  Inpatient Procedure: 2D Echo, Cardiac Doppler and Color Doppler Indications:    CM  History:        Patient has no prior history of Echocardiogram examinations.                 CHF, CAD; Risk Factors:Hypertension and Diabetes.  Sonographer:    Luisa Hart RDCS Referring Phys: [place]  1. Left ventricular ejection fraction, by estimation, is 25 to 30%. The left ventricle has severely decreased function. The left ventricle demonstrates regional wall motion abnormalities (see scoring diagram/findings for description). There is mild left  ventricular hypertrophy. Left ventricular diastolic parameters are consistent with Grade II diastolic dysfunction (pseudonormalization). Elevated left atrial pressure.  2. Akinetic apex not well visualized, recommend limited echo with contrast to rule out apical  thrombus  3. Right ventricular systolic function is normal. The right ventricular size is mildly enlarged.  4. Left atrial size was moderately dilated.  5. Right atrial size was mildly dilated.  6. The mitral valve is grossly normal. Trivial mitral valve regurgitation. No evidence of mitral stenosis.  7. Aortic dilatation noted. There is dilatation of the ascending aorta, measuring 44 mm.  8. The aortic valve is calcified. There is severe calcifcation of the aortic valve. Aortic valve regurgitation is trivial. Moderate aortic valve stenosis. Mild AS by gradients (Vmax 2.2 m/s, MG 12 mmHg), moderate by AVA (1.4 cm^2) and DI (0.27). Given severe systolic dysfunction, suspect low flow low gradient moderate AS FINDINGS  Left Ventricle: Left ventricular ejection fraction, by estimation, is 25 to 30%. The left ventricle has severely decreased function. The left ventricle demonstrates regional wall motion abnormalities. The left ventricular internal cavity size was normal  in size. There is mild left ventricular hypertrophy. Left ventricular diastolic parameters are consistent with Grade II diastolic dysfunction (pseudonormalization). Elevated left atrial pressure.  LV Wall Scoring: The mid and distal anterior wall, mid and distal anterior septum, entire apex, mid and distal inferior wall, and mid inferoseptal segment are akinetic. The mid inferolateral segment and mid anterolateral segment are hypokinetic. The basal anteroseptal segment, basal inferolateral segment, basal anterolateral segment, basal anterior segment, basal inferior segment, and basal inferoseptal segment are normal. Right Ventricle: The right ventricular size is mildly enlarged. Right vetricular wall thickness was not well visualized. Right ventricular systolic function is normal. Left Atrium: Left atrial size was moderately dilated. Right Atrium: Right atrial size was mildly dilated. Pericardium: Trivial pericardial effusion is present. Mitral Valve:  The mitral valve is grossly normal. Trivial mitral valve regurgitation. No evidence of mitral valve stenosis. MV peak gradient, 5.2 mmHg. The mean mitral valve gradient is 2.0 mmHg. Tricuspid Valve: The tricuspid valve is normal in structure. Tricuspid valve regurgitation is trivial. Aortic Valve: The aortic valve is calcified. There is severe calcifcation of the aortic valve. Aortic valve regurgitation is trivial. Aortic regurgitation PHT measures 555 msec. Mild to moderate aortic stenosis is present. Aortic valve mean gradient measures 11.5 mmHg. Aortic valve peak gradient measures 19.0 mmHg. Aortic valve  area, by VTI measures 1.68 cm. Pulmonic Valve: The pulmonic valve was not well visualized. Pulmonic valve regurgitation is trivial. Aorta: Aortic dilatation noted. There is dilatation of the ascending aorta, measuring 44 mm. IAS/Shunts: The interatrial septum was not well visualized.  LEFT VENTRICLE PLAX 2D LVIDd:         5.50 cm      Diastology LVIDs:         4.00 cm      LV e' medial:    2.39 cm/s LV PW:         1.00 cm      LV E/e' medial:  32.3 LV IVS:        1.10 cm      LV e' lateral:   4.57 cm/s LVOT diam:     2.60 cm      LV E/e' lateral: 16.9 LV SV:         94 LV SV Index:   47 LVOT Area:     5.31 cm  LV Volumes (MOD) LV vol d, MOD A2C: 204.0 ml LV vol d, MOD A4C: 234.0 ml LV vol s, MOD A2C: 125.0 ml LV vol s, MOD A4C: 130.0 ml LV SV MOD A2C:     79.0 ml LV SV MOD A4C:     234.0 ml LV SV MOD BP:      96.1 ml RIGHT VENTRICLE RV Basal diam:  4.60 cm RV Mid diam:    3.00 cm TAPSE (M-mode): 2.6 cm LEFT ATRIUM             Index        RIGHT ATRIUM           Index LA diam:        3.50 cm 1.75 cm/m   RA Area:     17.90 cm LA Vol (A2C):   78.0 ml 39.01 ml/m  RA Volume:   44.30 ml  22.15 ml/m LA Vol (A4C):   97.3 ml 48.66 ml/m LA Biplane Vol: 89.0 ml 44.51 ml/m  AORTIC VALVE                     PULMONIC VALVE AV Area (Vmax):    1.65 cm      PV Vmax:          0.85 m/s AV Area (Vmean):   1.50 cm      PV  Vmean:         56.000 cm/s AV Area (VTI):     1.68 cm      PV VTI:           0.166 m AV Vmax:           218.00 cm/s   PV Peak grad:     2.9 mmHg AV Vmean:          158.000 cm/s  PV Mean grad:     2.0 mmHg AV VTI:            0.562 m       PR End Diast Vel: 4.24 msec AV Peak Grad:      19.0 mmHg AV Mean Grad:      11.5 mmHg LVOT Vmax:         67.55 cm/s LVOT Vmean:        44.500 cm/s LVOT VTI:          0.178 m LVOT/AV VTI ratio: 0.32 AI PHT:  555 msec  AORTA Ao Root diam: 3.40 cm Ao Asc diam:  4.40 cm MITRAL VALVE                TRICUSPID VALVE MV Area (PHT): 4.06 cm     TR Peak grad:   22.5 mmHg MV Area VTI:   2.45 cm     TR Vmax:        237.00 cm/s MV Peak grad:  5.2 mmHg MV Mean grad:  2.0 mmHg     SHUNTS MV Vmax:       1.14 m/s     Systemic VTI:  0.18 m MV Vmean:      55.8 cm/s    Systemic Diam: 2.60 cm MV Decel Time: 187 msec MR Peak grad: 93.3 mmHg MR Mean grad: 50.0 mmHg MR Vmax:      483.00 cm/s MR Vmean:     324.0 cm/s MV E velocity: 77.10 cm/s MV A velocity: 103.00 cm/s MV E/A ratio:  0.75 Oswaldo Milian MD Electronically signed by Oswaldo Milian MD Signature Date/Time: 04/09/2021/5:31:50 PM    Final    US Abdomen Limited RUQ (LIVER/GB)  Result Date: 04/08/2021 CLINICAL DATA:  Bile duct dilation seen in the previous CT EXAM: ULTRASOUND ABDOMEN LIMITED RIGHT UPPER QUADRANT COMPARISON:  CT done on 04/07/2021 FINDINGS: Gallbladder: There are echogenic foci in the lumen of nondistended gallbladder suggesting gallstones. Technologist did not observe any tenderness over the gallbladder. There is no fluid around the gallbladder. Common bile duct: Proximal common bile duct measures 7.3 mm in diameter without definite intrahepatic duct dilation. Distal common bile duct is not adequately visualized for evaluation. Liver: There is coarsening of echoes in the liver. There is possible mild nodularity in the liver surface. No focal abnormality is seen. Portal vein is patent on color Doppler  imaging with normal direction of blood flow towards the liver. Other: None. IMPRESSION: Gallbladder stones. There are no sonographic signs of acute cholecystitis. Proximal common bile duct is prominent measuring 7.3 mm. There is no significant dilation of intrahepatic bile ducts. Distal common bile duct is not adequately visualized limiting evaluation for choledocholithiasis. There is coarsening of echoes in the liver which may be due to fatty infiltration or cirrhosis. No focal abnormality is seen in the visualized portions of liver. Electronically Signed   By: Elmer Picker M.D.   On: 04/08/2021 16:45    Cardiac Studies   ECHO:  1. Left ventricular ejection fraction, by estimation, is 25 to 30%. The  left ventricle has severely decreased function. The left ventricle  demonstrates regional wall motion abnormalities (see scoring  diagram/findings for description). There is mild left   ventricular hypertrophy. Left ventricular diastolic parameters are  consistent with Grade II diastolic dysfunction (pseudonormalization).  Elevated left atrial pressure.   2. Akinetic apex not well visualized, recommend limited echo with  contrast to rule out apical thrombus   3. Right ventricular systolic function is normal. The right ventricular  size is mildly enlarged.   4. Left atrial size was moderately dilated.   5. Right atrial size was mildly dilated.   6. The mitral valve is grossly normal. Trivial mitral valve  regurgitation. No evidence of mitral stenosis.   7. Aortic dilatation noted. There is dilatation of the ascending aorta,  measuring 44 mm.   8. The aortic valve is calcified. There is severe calcifcation of the  aortic valve. Aortic valve regurgitation is trivial. Moderate aortic valve  stenosis. Mild AS by gradients (Vmax 2.2 m/s,  MG 12 mmHg), moderate by AVA  (1.4 cm^2) and DI (0.27). Given  severe systolic dysfunction, suspect low flow low gradient moderate AS   Patient Profile      88 y.o. male with a hx of significant CAD with hx of stent to mRCA with DES 11/25/13 and DES to distal and proximal LCX with 2 overlapping stents EF at that time with EF 20%  who is being seen 04/09/2021 for the evaluation of cardiac risk stratification for possible hernia surgery at the request of Dr Dorris Fetch and Dr. Florene Glen.  Assessment & Plan    PREOP:  Likely not having surgery now.   AS:  Likely more significant stenosis than suggested above.  However, he is very frail and with his advanced age is not a candidate for, nor would he likely want, TAVR  BRADYCARDIA:  EKG with bradycardia.  He is on no negative chronotropes.  No indication for pacing.  No change in therapy.  CARDIOMYOPATHY:  As above.  He has not tolerated meds in the past and was only on low dose ACE several years ago.  His meds are sparse but he has not had acute cardiac complaints or evidence of volume overload.   I doubt that excess fluid or salt is an issue as I suspect his intake is limited.  No change in therapy   For questions or updates, please contact [place] Please consult [email or web address] for contact info under Cardiology/STEMI.   Signed, Minus Breeding, MD  04/10/2021, 1:01 PM

## 2021-04-10 NOTE — Progress Notes (Signed)
Foley catheter discontinued. Patient due to void at 1800.

## 2021-04-11 DIAGNOSIS — K56609 Unspecified intestinal obstruction, unspecified as to partial versus complete obstruction: Secondary | ICD-10-CM | POA: Diagnosis not present

## 2021-04-11 LAB — CBC WITH DIFFERENTIAL/PLATELET
Abs Immature Granulocytes: 0.03 10*3/uL (ref 0.00–0.07)
Basophils Absolute: 0 10*3/uL (ref 0.0–0.1)
Basophils Relative: 0 %
Eosinophils Absolute: 0.2 10*3/uL (ref 0.0–0.5)
Eosinophils Relative: 6 %
HCT: 32.3 % — ABNORMAL LOW (ref 39.0–52.0)
Hemoglobin: 10.8 g/dL — ABNORMAL LOW (ref 13.0–17.0)
Immature Granulocytes: 1 %
Lymphocytes Relative: 23 %
Lymphs Abs: 0.6 10*3/uL — ABNORMAL LOW (ref 0.7–4.0)
MCH: 31.3 pg (ref 26.0–34.0)
MCHC: 33.4 g/dL (ref 30.0–36.0)
MCV: 93.6 fL (ref 80.0–100.0)
Monocytes Absolute: 0.3 10*3/uL (ref 0.1–1.0)
Monocytes Relative: 11 %
Neutro Abs: 1.5 10*3/uL — ABNORMAL LOW (ref 1.7–7.7)
Neutrophils Relative %: 59 %
Platelets: 110 10*3/uL — ABNORMAL LOW (ref 150–400)
RBC: 3.45 MIL/uL — ABNORMAL LOW (ref 4.22–5.81)
RDW: 15.2 % (ref 11.5–15.5)
WBC: 2.6 10*3/uL — ABNORMAL LOW (ref 4.0–10.5)
nRBC: 0 % (ref 0.0–0.2)

## 2021-04-11 LAB — MAGNESIUM: Magnesium: 1.8 mg/dL (ref 1.7–2.4)

## 2021-04-11 LAB — COMPREHENSIVE METABOLIC PANEL
ALT: 14 U/L (ref 0–44)
AST: 25 U/L (ref 15–41)
Albumin: 3 g/dL — ABNORMAL LOW (ref 3.5–5.0)
Alkaline Phosphatase: 55 U/L (ref 38–126)
Anion gap: 4 — ABNORMAL LOW (ref 5–15)
BUN: 10 mg/dL (ref 8–23)
CO2: 29 mmol/L (ref 22–32)
Calcium: 8.4 mg/dL — ABNORMAL LOW (ref 8.9–10.3)
Chloride: 103 mmol/L (ref 98–111)
Creatinine, Ser: 0.7 mg/dL (ref 0.61–1.24)
GFR, Estimated: >60 mL/min (ref >60)
Glucose, Bld: 75 mg/dL (ref 70–99)
Potassium: 3.9 mmol/L (ref 3.5–5.1)
Sodium: 136 mmol/L (ref 135–145)
Total Bilirubin: 0.8 mg/dL (ref 0.3–1.2)
Total Protein: 5.5 g/dL — ABNORMAL LOW (ref 6.5–8.1)

## 2021-04-11 LAB — GLUCOSE, CAPILLARY
Glucose-Capillary: 70 mg/dL (ref 70–99)
Glucose-Capillary: 126 mg/dL — ABNORMAL HIGH (ref 70–99)

## 2021-04-11 LAB — PHOSPHORUS: Phosphorus: 2.8 mg/dL (ref 2.5–4.6)

## 2021-04-11 MED ORDER — POLYETHYLENE GLYCOL 3350 17 G PO PACK: 17.0000 g | PACK | Freq: Two times a day (BID) | ORAL | 0 refills | Status: AC

## 2021-04-11 MED ORDER — TAMSULOSIN HCL 0.4 MG PO CAPS: 0.4000 mg | ORAL_CAPSULE | Freq: Every day | ORAL | 1 refills | Status: AC

## 2021-04-11 NOTE — Progress Notes (Signed)
Discharge instructions were attempted to be given to facility. Unable to give report at this time. [facility] attempted to be called to set up ride. Voicemail left.

## 2021-04-11 NOTE — NC FL2 (Signed)
Door LEVEL OF CARE SCREENING TOOL     IDENTIFICATION  Patient Name: [name] [name] Birthdate: March 17, 1928 Sex: male Admission Date (Current Location): 04/07/2021  [place] and [place] Number:  [name] [name] (nephew) Ph: [phone] Facility and Address:  [facility],  [place], [place]      Provider Number: [id number]  Attending Physician Name and Address:  Shelly Coss, MD  Relative Name and Phone Number:       Current Level of Care: Hospital Recommended Level of Care: [place] Prior Approval Number:    Date Approved/Denied:   PASRR Number:    Discharge Plan: Other (Comment) [facility])    Current Diagnoses: Patient Active Problem List   Diagnosis Date Noted   Chronic systolic CHF (congestive heart failure) ([place]) 04/10/2021   Aortic stenosis 04/10/2021   Sinus bradycardia 04/10/2021   Left inguinal hernia 04/08/2021   Dehydration 04/08/2021   Acute prerenal azotemia 04/08/2021   Thrombocytopenia ([place]) 04/08/2021   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   Acute urinary retention 04/08/2021   BPH (benign prostatic hyperplasia)    SBO (small bowel obstruction) ([place]) 04/07/2021   S/P PTCA (percutaneous transluminal coronary angioplasty) 12/28/2013   Coronary artery disease 12/26/2013   Postsurgical percutaneous transluminal coronary angioplasty (PTCA) status 11/25/2013   ACUTE ATOPIC CONJUNCTIVITIS 01/08/2007   ACUTE BRONCHITIS 01/08/2007   INSOMNIA-SLEEP DISORDER-UNSPEC 12/19/2006   ANXIETY 12/10/2006   DEPRESSION 12/10/2006   TINNITUS, CHRONIC 12/10/2006   ALLERGIC RHINITIS 12/10/2006   BENIGN PROSTATIC HYPERTROPHY 12/10/2006   WEIGHT LOSS 12/10/2006   GLUCOSE INTOLERANCE, MINIMAL, HX OF 12/10/2006   NEPHROLITHIASIS, HX OF 12/10/2006   RHINITIS, ALLERGIC NEC 12/09/2006   Unspecified asthma(493.90) 12/09/2006   COLONIC POLYPS 11/24/2006   Diabetes mellitus without complication ([place])  88/32/5498   HYPERLIPIDEMIA 11/24/2006   HYPERTENSION 11/24/2006   GERD 11/24/2006   Diverticulosis of colon (without mention of hemorrhage) 11/24/2006   CALCULUS, KIDNEY 11/24/2006   BENIGN PROSTATIC HYPERTROPHY, HX OF 11/24/2006    Orientation RESPIRATION BLADDER Height & Weight     Self, Time, Situation, Place  Normal Incontinent Weight: 111 lb 15.9 oz (50.8 kg) Height:  5\' 10"  (177.8 cm)  BEHAVIORAL SYMPTOMS/MOOD NEUROLOGICAL BOWEL NUTRITION STATUS   (N/A)  (N/A) Continent Diet (Soft diet)  AMBULATORY STATUS COMMUNICATION OF NEEDS Skin   Limited Assist Verbally Skin abrasions, Other (Comment) (Abrasion: arm, leg, back; Ecchymosis: arm, leg)                       Personal Care Assistance Level of Assistance  Bathing, Feeding, Dressing Bathing Assistance: Limited assistance Feeding assistance: Limited assistance Dressing Assistance: Limited assistance     Functional Limitations Info  Sight, Hearing, Speech Sight Info: Adequate Hearing Info: Adequate Speech Info: Adequate    SPECIAL CARE FACTORS FREQUENCY                       Contractures Contractures Info: Not present    Additional Factors Info  Code Status, Allergies, Insulin Sliding Scale Code Status Info: Full Allergies Info: Lipitor (Atorvastatin Calcium)   Insulin Sliding Scale Info: See discharge summary       Current Medications (04/11/2021):  This is the current hospital active medication list Current Facility-Administered Medications  Medication Dose Route Frequency Provider Last Rate Last Admin   acetaminophen (TYLENOL) tablet 650 mg  650 mg Oral Q6H PRN Howerter, Justin B, DO       Or  acetaminophen (TYLENOL) suppository 650 mg  650 mg Rectal Q6H PRN Howerter, Justin B, DO       Chlorhexidine Gluconate Cloth 2 % PADS 6 each  6 each Topical Daily Elodia Florence., MD   6 each at 04/10/21 1001   dextrose 5 % in lactated ringers infusion   Intravenous Continuous Elodia Florence., MD   Stopped at 04/10/21 1900   feeding supplement (ENSURE ENLIVE / ENSURE PLUS) liquid 237 mL  237 mL Oral TID WC Jill Alexanders, PA-C   237 mL at 04/10/21 1750   fentaNYL (SUBLIMAZE) injection 50 mcg  50 mcg Intravenous Q2H PRN Howerter, Justin B, DO   50 mcg at 04/09/21 2210   insulin aspart (novoLOG) injection 0-6 Units  0-6 Units Subcutaneous Q6H Howerter, Justin B, DO       naloxone (NARCAN) injection 0.4 mg  0.4 mg Intravenous PRN Howerter, Justin B, DO       ondansetron (ZOFRAN) injection 4 mg  4 mg Intravenous Q6H PRN Howerter, Justin B, DO       phenol (CHLORASEPTIC) mouth spray 1 spray  1 spray Mouth/Throat PRN Elodia Florence., MD   1 spray at 04/10/21 0205   polyethylene glycol (MIRALAX / GLYCOLAX) packet 17 g  17 g Oral BID Elodia Florence., MD   17 g at 04/10/21 0959   sorbitol, milk of mag, mineral oil, glycerin (SMOG) enema  960 mL Rectal Once Elodia Florence., MD       tamsulosin [facility]) capsule 0.4 mg  0.4 mg Oral Daily Elodia Florence., MD   0.4 mg at 04/10/21 1308     Discharge Medications: Please see discharge summary for a list of discharge medications.  TAKE these medications     clopidogrel 75 MG tablet Commonly known as: PLAVIX Take 1 tablet (75 mg total) by mouth daily with breakfast.    docusate sodium 100 MG capsule Commonly known as: COLACE Take 1 capsule (100 mg total) by mouth daily.    polyethylene glycol 17 g packet Commonly known as: MIRALAX / GLYCOLAX Take 17 g by mouth 2 (two) times daily.    tamsulosin 0.4 MG Caps capsule Commonly known as: Flomax Take 1 capsule (0.4 mg total) by mouth daily.    Relevant Imaging Results:  Relevant Lab Results:   Additional Information SSN: [id number]  Sherie Don, LCSW

## 2021-04-11 NOTE — Discharge Summary (Signed)
Physician Discharge Summary  MADSEN RIDDLE NTZ:[id number] DOB: 1928-12-12 DOA: 04/07/2021  PCP: Antony Contras, MD  Admit date: 04/07/2021 Discharge date: 04/11/2021  Admitted From: ALF Disposition: ALF  Discharge Condition:Stable CODE STATUS:FULL Diet recommendation: Soft diet for next 2-3 days  Brief/Interim Summary: 88 yo with hx bilateral inguinal hernia s/p repair (left 1962, right 1980), BPH, T2DM, GERD and multiple other medical issues who presented with abdominal pain and was found to have incarcerated L inguinal hernia with SBO.  Hernia reduced in ED by general surgery.    He's gradually improving, passing gas, but hasn't had bowel movement yet.  He's not interested in elective surgical repair at this time.  We recommended to take enema but he continues to decline.  His abdomen is soft, nondistended, he has good bowel sounds.  He is medically and surgically ready for discharge.  General surgery has cleared him for discharge.  Plan is to send him back to his ALF.  He needs to continue MiraLAX and Colace as an outpatient.  Following problems were addressed during his hospitalization:   SBO (small bowel obstruction) ([place])- (present on admission) CT showing recurrent L inguinal hernia with SBO (high grade partial or complete SBO) Inguinal hernia reduced by surgery in ED Plain film with nonobstructive pattern of bowel gas, large stool burden Cardiology was consulted for surgical clearance - awaiting EKG (brady), echo (EF 25-50%, grade II diastolic dysfunction) - no current plans for surgery now. We recommended to take enema but he continues to decline.  His abdomen is soft, nondistended, he has good bowel sounds.  He is medically and surgically ready for discharge.  General surgery has cleared him for discharge.  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  Recommend outpatient cardiology follow-up   Aortic stenosis Moderate AV stenosis We recommend outpatient cardiology     Discharge Diagnoses:  Principal Problem:   SBO (small bowel obstruction) (HCC) Active Problems:   Diabetes mellitus without complication (HCC)   Coronary artery disease   Left inguinal hernia   Dehydration   Acute prerenal azotemia   BPH (benign prostatic hyperplasia)   Thrombocytopenia (HCC)   Biliary anomaly   Acute urinary retention   Chronic systolic CHF (congestive heart failure) (HCC)   Aortic stenosis   Sinus bradycardia    Discharge Instructions  Discharge Instructions     Diet general   Complete by: As directed    Soft diet for next 2-3 days   Discharge instructions   Complete by: As directed    1)Please take the stool softeners as instructed 2)Follow up with your primary care physician in a week   Increase activity slowly   Complete by: As directed       Allergies as of 04/11/2021  Reactions   Lipitor [atorvastatin Calcium] Other (See Comments)   Irregular lab results--pt unsure of exact lab        Medication List     TAKE these medications    clopidogrel 75 MG tablet Commonly known as:  PLAVIX Take 1 tablet (75 mg total) by mouth daily with breakfast.   docusate sodium 100 MG capsule Commonly known as: COLACE Take 1 capsule (100 mg total) by mouth daily.   polyethylene glycol 17 g packet Commonly known as: MIRALAX / GLYCOLAX Take 17 g by mouth 2 (two) times daily.   tamsulosin 0.4 MG Caps capsule Commonly known as: Flomax Take 1 capsule (0.4 mg total) by mouth daily.        Follow-up Information     Antony Contras, MD. Schedule an appointment as soon as possible for a visit in 1 week(s).   Specialty: Family Medicine Contact information: [address] [place] [address] [phone]                Allergies  Allergen Reactions   Lipitor [Atorvastatin Calcium] Other (See Comments)    Irregular lab results--pt unsure of exact lab    Consultations: Cardiology, general surgery   Procedures/Studies: CT ABDOMEN PELVIS W CONTRAST  Result Date: 04/07/2021 CLINICAL DATA:  A 88 year old male presents for evaluation of constipation and emesis, concern for bowel obstruction, presenting with LEFT inguinal mass. EXAM: CT ABDOMEN AND PELVIS WITH CONTRAST TECHNIQUE: Multidetector CT imaging of the abdomen and pelvis was performed using the standard protocol following bolus administration of intravenous contrast. RADIATION DOSE REDUCTION: This exam was performed according to the departmental dose-optimization program which includes automated exposure control, adjustment of the mA and/or kV according to patient size and/or use of iterative reconstruction technique. CONTRAST:  163mL OMNIPAQUE IOHEXOL 300 MG/ML  SOLN COMPARISON:  Comparison made with November 17, 2017. FINDINGS: Lower chest: Lung bases are clear. Heart size is enlarged. Signs of coronary artery disease. Thinning of the LEFT ventricular apex as before. Hepatobiliary: Liver without focal lesion. Portal vein is patent. Gallbladder is contracted. There is mild intrahepatic biliary duct  distension that is new from previous imaging. Pancreas: Mild pancreatic atrophy. No substantial pancreatic duct distension. Spleen: Normal. Adrenals/Urinary Tract: Adrenal glands are normal. RIGHT renal cysts. Nephrolithiasis with calculi in the interpolar RIGHT kidney and lower pole of the RIGHT kidney. Interpolar calculus similar to the prior study. Lower pole calculus new compared to previous imaging. No suspicious renal lesion. No perivesical stranding. No hydronephrosis. Stomach/Bowel: Abundant stool is present in the colon. No surrounding stranding. The appendix is normal. Recurrent LEFT inguinal hernia contains a focally dilated small bowel loops with mild surrounding stranding. Abdominal wall defect best seen on the sagittal images, image 112/5) defect approximately 5 mm. Diffuse bowel dilation upstream from the herniated bowel. Hernia likely occurs at the level of the proximal ileum. Bowel can be seen crossing from RIGHT to LEFT in the abdomen towards the ileocecal valve. This bowel is decompressed/collapsed in the RIGHT upper quadrant. Distortion of mesenteric vessels likely due to bowel distension and presence of hernia in the LEFT lower quadrant distorting bowel loops. Bowel upstream from the hernia is dilated up to 3 cm. No signs of pneumatosis. Colonic diverticulosis. Vascular/Lymphatic: Aortic atherosclerosis. No sign of aneurysm. Smooth contour of the IVC. There is no gastrohepatic or hepatoduodenal ligament lymphadenopathy. No retroperitoneal or mesenteric lymphadenopathy. No pelvic sidewall lymphadenopathy. Reproductive: Mild prostatomegaly with heterogeneity. Other: Small volume fluid in the abdomen along the  RIGHT pericolic gutter. Diffuse mild mesenteric edema, difficult to quantify given the lack of mesenteric fat. No pneumoperitoneum. Musculoskeletal: Spinal degenerative changes. No acute or destructive bone process. IMPRESSION: 1. Recurrent LEFT inguinal hernia leading to small bowel  obstruction either high-grade partial or complete small bowel obstruction in the LEFT inguinal region. Defect in the abdominal wall fascia is narrow, based on this the hernia is at high-risk for strangulation or incarceration. Surgical consultation is suggested. 2. Small volume fluid in the abdomen along the RIGHT pericolic gutter. No pneumatosis or free air currently. 3. Diffuse mild mesenteric edema, difficult to quantify given the lack of mesenteric fat. 4. Mild intra and extrahepatic biliary duct distension that is new from previous imaging. Correlation with hepatic function tests may be helpful to determine whether follow-up or further assessment may be warranted. 5. Nonobstructing right nephrolithiasis. 6. Colonic diverticulosis without evidence of acute diverticulitis. 7. Aortic atherosclerosis. Aortic Atherosclerosis (ICD10-I70.0). Electronically Signed   By: Zetta Bills M.D.   On: 04/07/2021 16:57   DG Abd Portable 1V  Result Date: 04/08/2021 CLINICAL DATA:  Small bowel obstruction EXAM: PORTABLE ABDOMEN - 1 VIEW COMPARISON:  CT abdomen pelvis, 04/07/2021 FINDINGS: Nonobstructive pattern of bowel gas. Gas-filled, nondistended loops of small bowel in the central abdomen measuring up to 2.8 cm in caliber. Large burden of stool throughout the colon and rectum, with gas and stool present to the rectum. Excreted contrast in the urinary bladder. No obvious free air on single supine radiograph. IMPRESSION: 1. Nonobstructive pattern of bowel gas. Gas-filled, nondistended loops of small bowel in the central abdomen measuring up to 2.8 cm in caliber. 2. Large burden of stool throughout the colon and rectum, with gas and stool present to the rectum. Electronically Signed   By: Delanna Ahmadi M.D.   On: 04/08/2021 10:33   ECHOCARDIOGRAM COMPLETE  Result Date: 04/09/2021    ECHOCARDIOGRAM REPORT   Patient Name:   MERIL DRAY Date of Exam: 04/09/2021 Medical Rec #:  [id number]      Height:       70.0 in  Accession #:    [id number]     Weight:       180.8 lb Date of Birth:  12/12/1928      BSA:          2.000 m Patient Age:    70 years       BP:           101/56 mmHg Patient Gender: M              HR:           67 bpm. Exam Location:  Inpatient Procedure: 2D Echo, Cardiac Doppler and Color Doppler Indications:    CM  History:        Patient has no prior history of Echocardiogram examinations.                 CHF, CAD; Risk Factors:Hypertension and Diabetes.  Sonographer:    Luisa Hart RDCS Referring Phys: [place]  1. Left ventricular ejection fraction, by estimation, is 25 to 30%. The left ventricle has severely decreased function. The left ventricle demonstrates regional wall motion abnormalities (see scoring diagram/findings for description). There is mild left  ventricular hypertrophy. Left ventricular diastolic parameters are consistent with Grade II diastolic dysfunction (pseudonormalization). Elevated left atrial pressure.  2. Akinetic apex not well visualized, recommend limited echo with contrast to rule out apical thrombus  3. Right ventricular  systolic function is normal. The right ventricular size is mildly enlarged.  4. Left atrial size was moderately dilated.  5. Right atrial size was mildly dilated.  6. The mitral valve is grossly normal. Trivial mitral valve regurgitation. No evidence of mitral stenosis.  7. Aortic dilatation noted. There is dilatation of the ascending aorta, measuring 44 mm.  8. The aortic valve is calcified. There is severe calcifcation of the aortic valve. Aortic valve regurgitation is trivial. Moderate aortic valve stenosis. Mild AS by gradients (Vmax 2.2 m/s, MG 12 mmHg), moderate by AVA (1.4 cm^2) and DI (0.27). Given severe systolic dysfunction, suspect low flow low gradient moderate AS FINDINGS  Left Ventricle: Left ventricular ejection fraction, by estimation, is 25 to 30%. The left ventricle has severely decreased function. The left ventricle  demonstrates regional wall motion abnormalities. The left ventricular internal cavity size was normal  in size. There is mild left ventricular hypertrophy. Left ventricular diastolic parameters are consistent with Grade II diastolic dysfunction (pseudonormalization). Elevated left atrial pressure.  LV Wall Scoring: The mid and distal anterior wall, mid and distal anterior septum, entire apex, mid and distal inferior wall, and mid inferoseptal segment are akinetic. The mid inferolateral segment and mid anterolateral segment are hypokinetic. The basal anteroseptal segment, basal inferolateral segment, basal anterolateral segment, basal anterior segment, basal inferior segment, and basal inferoseptal segment are normal. Right Ventricle: The right ventricular size is mildly enlarged. Right vetricular wall thickness was not well visualized. Right ventricular systolic function is normal. Left Atrium: Left atrial size was moderately dilated. Right Atrium: Right atrial size was mildly dilated. Pericardium: Trivial pericardial effusion is present. Mitral Valve: The mitral valve is grossly normal. Trivial mitral valve regurgitation. No evidence of mitral valve stenosis. MV peak gradient, 5.2 mmHg. The mean mitral valve gradient is 2.0 mmHg. Tricuspid Valve: The tricuspid valve is normal in structure. Tricuspid valve regurgitation is trivial. Aortic Valve: The aortic valve is calcified. There is severe calcifcation of the aortic valve. Aortic valve regurgitation is trivial. Aortic regurgitation PHT measures 555 msec. Mild to moderate aortic stenosis is present. Aortic valve mean gradient measures 11.5 mmHg. Aortic valve peak gradient measures 19.0 mmHg. Aortic valve area, by VTI measures 1.68 cm. Pulmonic Valve: The pulmonic valve was not well visualized. Pulmonic valve regurgitation is trivial. Aorta: Aortic dilatation noted. There is dilatation of the ascending aorta, measuring 44 mm. IAS/Shunts: The interatrial septum was  not well visualized.  LEFT VENTRICLE PLAX 2D LVIDd:         5.50 cm      Diastology LVIDs:         4.00 cm      LV e' medial:    2.39 cm/s LV PW:         1.00 cm      LV E/e' medial:  32.3 LV IVS:        1.10 cm      LV e' lateral:   4.57 cm/s LVOT diam:     2.60 cm      LV E/e' lateral: 16.9 LV SV:         94 LV SV Index:   47 LVOT Area:     5.31 cm  LV Volumes (MOD) LV vol d, MOD A2C: 204.0 ml LV vol d, MOD A4C: 234.0 ml LV vol s, MOD A2C: 125.0 ml LV vol s, MOD A4C: 130.0 ml LV SV MOD A2C:     79.0 ml LV SV MOD A4C:  234.0 ml LV SV MOD BP:      96.1 ml RIGHT VENTRICLE RV Basal diam:  4.60 cm RV Mid diam:    3.00 cm TAPSE (M-mode): 2.6 cm LEFT ATRIUM             Index        RIGHT ATRIUM           Index LA diam:        3.50 cm 1.75 cm/m   RA Area:     17.90 cm LA Vol (A2C):   78.0 ml 39.01 ml/m  RA Volume:   44.30 ml  22.15 ml/m LA Vol (A4C):   97.3 ml 48.66 ml/m LA Biplane Vol: 89.0 ml 44.51 ml/m  AORTIC VALVE                     PULMONIC VALVE AV Area (Vmax):    1.65 cm      PV Vmax:          0.85 m/s AV Area (Vmean):   1.50 cm      PV Vmean:         56.000 cm/s AV Area (VTI):     1.68 cm      PV VTI:           0.166 m AV Vmax:           218.00 cm/s   PV Peak grad:     2.9 mmHg AV Vmean:          158.000 cm/s  PV Mean grad:     2.0 mmHg AV VTI:            0.562 m       PR End Diast Vel: 4.24 msec AV Peak Grad:      19.0 mmHg AV Mean Grad:      11.5 mmHg LVOT Vmax:         67.55 cm/s LVOT Vmean:        44.500 cm/s LVOT VTI:          0.178 m LVOT/AV VTI ratio: 0.32 AI PHT:            555 msec  AORTA Ao Root diam: 3.40 cm Ao Asc diam:  4.40 cm MITRAL VALVE                TRICUSPID VALVE MV Area (PHT): 4.06 cm     TR Peak grad:   22.5 mmHg MV Area VTI:   2.45 cm     TR Vmax:        237.00 cm/s MV Peak grad:  5.2 mmHg MV Mean grad:  2.0 mmHg     SHUNTS MV Vmax:       1.14 m/s     Systemic VTI:  0.18 m MV Vmean:      55.8 cm/s    Systemic Diam: 2.60 cm MV Decel Time: 187 msec MR Peak grad: 93.3 mmHg MR  Mean grad: 50.0 mmHg MR Vmax:      483.00 cm/s MR Vmean:     324.0 cm/s MV E velocity: 77.10 cm/s MV A velocity: 103.00 cm/s MV E/A ratio:  0.75 Oswaldo Milian MD Electronically signed by Oswaldo Milian MD Signature Date/Time: 04/09/2021/5:31:50 PM    Final    ECHOCARDIOGRAM LIMITED  Result Date: 04/10/2021    ECHOCARDIOGRAM LIMITED REPORT   Patient Name:   DAEJON LICH Date of Exam: 04/10/2021 Medical Rec #:  [id number]  Height:       70.0 in Accession #:    [id number]     Weight:       111.6 lb Date of Birth:  [birth year]      BSA:          1.629 m Patient Age:    35 years       BP:           101/61 mmHg Patient Gender: M              HR:           67 bpm. Exam Location:  Inpatient Procedure: Intracardiac Opacification Agent and Limited Echo Indications:    R/O apical thrombus  History:        Patient has prior history of Echocardiogram examinations, most                 recent 04/09/2021.  Sonographer:    Luisa Hart RDCS Referring Phys: [phone] A CALDWELL POWELL [place]  1. Limited study to exclude LV thrombus. No LV thrombus on contrast imaging. 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 Left ventricular ejection fraction, by estimation, is 20 to 25%. The left ventricle has severely decreased function. FINDINGS  Left Ventricle: Limited study to exclude LV thrombus. No LV thrombus on contrast imaging. 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 Left ventricular ejection fraction, by estimation, is 20 to 25%. The left ventricle has severely decreased function.  LV Wall Scoring: The mid and distal anterior septum, entire apex, and mid inferoseptal segment are akinetic. Eleonore Chiquito MD Electronically signed by Eleonore Chiquito MD Signature Date/Time: 04/10/2021/1:24:42 PM    Final    US Abdomen Limited RUQ (LIVER/GB)  Result Date: 04/08/2021 CLINICAL DATA:  Bile duct dilation seen in the previous CT EXAM: ULTRASOUND ABDOMEN LIMITED RIGHT UPPER QUADRANT COMPARISON:  CT done on 04/07/2021 FINDINGS: Gallbladder: There are echogenic  foci in the lumen of nondistended gallbladder suggesting gallstones. Technologist did not observe any tenderness over the gallbladder. There is no fluid around the gallbladder. Common bile duct: Proximal common bile duct measures 7.3 mm in diameter without definite intrahepatic duct dilation. Distal common bile duct is not adequately visualized for evaluation. Liver: There is coarsening of echoes in the liver. There is possible mild nodularity in the liver surface. No focal abnormality is seen. Portal vein is patent on color Doppler imaging with normal direction of blood flow towards the liver. Other: None. IMPRESSION: Gallbladder stones. There are no sonographic signs of acute cholecystitis. Proximal common bile duct is prominent measuring 7.3 mm. There is no significant dilation of intrahepatic bile ducts. Distal common bile duct is not adequately visualized limiting evaluation for choledocholithiasis. There is coarsening of echoes in the liver which may be due to fatty infiltration or cirrhosis. No focal abnormality is seen in the visualized portions of liver. Electronically Signed   By: Elmer Picker M.D.   On: 04/08/2021 16:45      Subjective: Patient seen and examined at bedside this morning.  Hemodynamically stable for discharge to ALF today.  He declines to take any meds.  His abdomen is benign.  I called and discussed with his niece about discharge planning  Discharge Exam: Vitals:   04/10/21 2240 04/11/21 0607  BP: 106/67 (!) 100/58  Pulse: (!) 53 (!) 59  Resp:  15  Temp:  (!) 97.4 F (36.3 C)  SpO2:  98%   Vitals:   04/10/21 2123 04/10/21 2240 04/11/21 0500 04/11/21 0607  BP: (!) 87/59 106/67  Marland Kitchen)  100/58  Pulse: (!) 59 (!) 53  (!) 59  Resp: 18   15  Temp: (!) 97.3 F (36.3 C)   (!) 97.4 F (36.3 C)  TempSrc:      SpO2: 97%   98%  Weight:   50.8 kg   Height:        General: Pt is alert, awake, not in acute distress Cardiovascular: RRR, S1/S2 +, no rubs, no  gallops Respiratory: CTA bilaterally, no wheezing, no rhonchi Abdominal: Soft, NT, ND, bowel sounds + Extremities: no edema, no cyanosis    The results of significant diagnostics from this hospitalization (including imaging, microbiology, ancillary and laboratory) are listed below for reference.     Microbiology: Recent Results (from the past 240 hour(s))  Resp Panel by RT-PCR (Flu A&B, Covid) Nasopharyngeal Swab     Status: None   Collection Time: 04/07/21  9:37 PM   Specimen: Nasopharyngeal Swab; Nasopharyngeal(NP) swabs in vial transport medium  Result Value Ref Range Status   SARS Coronavirus 2 by RT PCR NEGATIVE NEGATIVE Final    Comment: (NOTE) SARS-CoV-2 target nucleic acids are NOT DETECTED.  The SARS-CoV-2 RNA is generally detectable in upper respiratory specimens during the acute phase of infection. The lowest concentration of SARS-CoV-2 viral copies this assay can detect is 138 copies/mL. A negative result does not preclude SARS-Cov-2 infection and should not be used as the sole basis for treatment or other patient management decisions. A negative result may occur with  improper specimen collection/handling, submission of specimen other than nasopharyngeal swab, presence of viral mutation(s) within the areas targeted by this assay, and inadequate number of viral copies(<138 copies/mL). A negative result must be combined with clinical observations, patient history, and epidemiological information. The expected result is Negative.  Fact Sheet for Patients:  EntrepreneurPulse.com.au  Fact Sheet for Healthcare Providers:  IncredibleEmployment.be  This test is no t yet approved or cleared by the [place] FDA and  has been authorized for detection and/or diagnosis of SARS-CoV-2 by FDA under an Emergency Use Authorization (EUA). This EUA will remain  in effect (meaning this test can be used) for the duration of the COVID-19  declaration under Section 564(b)(1) of the Act, 21 U.S.C.section 360bbb-3(b)(1), unless the authorization is terminated  or revoked sooner.       Influenza A by PCR NEGATIVE NEGATIVE Final   Influenza B by PCR NEGATIVE NEGATIVE Final    Comment: (NOTE) The Xpert Xpress SARS-CoV-2/FLU/RSV plus assay is intended as an aid in the diagnosis of influenza from Nasopharyngeal swab specimens and should not be used as a sole basis for treatment. Nasal washings and aspirates are unacceptable for Xpert Xpress SARS-CoV-2/FLU/RSV testing.  Fact Sheet for Patients: EntrepreneurPulse.com.au  Fact Sheet for Healthcare Providers: IncredibleEmployment.be  This test is not yet approved or cleared by the [place] FDA and has been authorized for detection and/or diagnosis of SARS-CoV-2 by FDA under an Emergency Use Authorization (EUA). This EUA will remain in effect (meaning this test can be used) for the duration of the COVID-19 declaration under Section 564(b)(1) of the Act, 21 U.S.C. section 360bbb-3(b)(1), unless the authorization is terminated or revoked.  Performed at [facility], [place] [address], [place], [place] [address]      Labs: BNP (last 3 results) No results for input(s): BNP in the last 8760 hours. Basic Metabolic Panel: Recent Labs  Lab 04/07/21 1400 04/08/21 0416 04/09/21 0354 04/10/21 0436 04/11/21 0456  NA 137 137 136 134* 136  K  4.0 4.6 4.0 3.6 3.9  CL 100 103 104 100 103  CO2 30 30 29 30 29   GLUCOSE 143* 88 77 108* 75  BUN 18 16 15 11 10   CREATININE 0.70 0.73 0.55* 0.61 0.70  CALCIUM 9.1 8.7* 8.4* 8.3* 8.4*  MG  --  2.0   2.1 1.9 1.8 1.8  PHOS  --   --  3.1 2.5 2.8   Liver Function Tests: Recent Labs  Lab 04/07/21 1400 04/08/21 0416 04/09/21 0354 04/10/21 0436 04/11/21 0456  AST 17 18 24 25 25   ALT 11 11 10 13 14   ALKPHOS 64 53 47 51 55  BILITOT 0.7 0.5 1.0 0.8 0.8  PROT 6.8 5.9* 5.3* 5.4*  5.5*  ALBUMIN 4.0 3.4* 3.2* 3.0* 3.0*   Recent Labs  Lab 04/07/21 1400  LIPASE 81*   No results for input(s): AMMONIA in the last 168 hours. CBC: Recent Labs  Lab 04/07/21 1400 04/08/21 0416 04/09/21 0354 04/10/21 0436 04/11/21 0456  WBC 2.9* 4.5 3.5* 3.6* 2.6*  NEUTROABS 2.2 3.5 2.4 2.6 1.5*  HGB 11.8* 10.3* 10.1* 10.5* 10.8*  HCT 35.5* 31.6* 31.0* 31.8* 32.3*  MCV 94.2 95.8 96.6 96.4 93.6  PLT 127* 115* 105* 105* 110*   Cardiac Enzymes: No results for input(s): CKTOTAL, CKMB, CKMBINDEX, TROPONINI in the last 168 hours. BNP: Invalid input(s): POCBNP CBG: Recent Labs  Lab 04/10/21 0535 04/10/21 1211 04/10/21 1739 04/10/21 2321 04/11/21 0528  GLUCAP 93 116* 82 86 70   D-Dimer No results for input(s): DDIMER in the last 72 hours. Hgb A1c No results for input(s): HGBA1C in the last 72 hours. Lipid Profile No results for input(s): CHOL, HDL, LDLCALC, TRIG, CHOLHDL, LDLDIRECT in the last 72 hours. Thyroid function studies No results for input(s): TSH, T4TOTAL, T3FREE, THYROIDAB in the last 72 hours.  Invalid input(s): FREET3 Anemia work up No results for input(s): VITAMINB12, FOLATE, FERRITIN, TIBC, IRON, RETICCTPCT in the last 72 hours. Urinalysis    Component Value Date/Time   COLORURINE YELLOW 04/08/2021 0226   APPEARANCEUR CLEAR 04/08/2021 0226   LABSPEC >1.046 (H) 04/08/2021 0226   PHURINE 6.0 04/08/2021 0226   GLUCOSEU NEGATIVE 04/08/2021 0226   GLUCOSEU NEGATIVE 10/13/2006 1709   HGBUR NEGATIVE 04/08/2021 0226   BILIRUBINUR NEGATIVE 04/08/2021 0226   BILIRUBINUR negative 10/11/2016 1809   KETONESUR 5 (A) 04/08/2021 0226   PROTEINUR NEGATIVE 04/08/2021 0226   UROBILINOGEN 0.2 10/11/2016 1809   UROBILINOGEN 1.0 03/09/2007 1122   NITRITE NEGATIVE 04/08/2021 0226   LEUKOCYTESUR NEGATIVE 04/08/2021 0226   Sepsis Labs Invalid input(s): PROCALCITONIN,  WBC,  LACTICIDVEN Microbiology Recent Results (from the past 240 hour(s))  Resp Panel by RT-PCR  (Flu A&B, Covid) Nasopharyngeal Swab     Status: None   Collection Time: 04/07/21  9:37 PM   Specimen: Nasopharyngeal Swab; Nasopharyngeal(NP) swabs in vial transport medium  Result Value Ref Range Status   SARS Coronavirus 2 by RT PCR NEGATIVE NEGATIVE Final    Comment: (NOTE) SARS-CoV-2 target nucleic acids are NOT DETECTED.  The SARS-CoV-2 RNA is generally detectable in upper respiratory specimens during the acute phase of infection. The lowest concentration of SARS-CoV-2 viral copies this assay can detect is 138 copies/mL. A negative result does not preclude SARS-Cov-2 infection and should not be used as the sole basis for treatment or other patient management decisions. A negative result may occur with  improper specimen collection/handling, submission of specimen other than nasopharyngeal swab, presence of viral mutation(s) within the areas targeted by this  assay, and inadequate number of viral copies(<138 copies/mL). A negative result must be combined with clinical observations, patient history, and epidemiological information. The expected result is Negative.  Fact Sheet for Patients:  EntrepreneurPulse.com.au  Fact Sheet for Healthcare Providers:  IncredibleEmployment.be  This test is no t yet approved or cleared by the [place] FDA and  has been authorized for detection and/or diagnosis of SARS-CoV-2 by FDA under an Emergency Use Authorization (EUA). This EUA will remain  in effect (meaning this test can be used) for the duration of the COVID-19 declaration under Section 564(b)(1) of the Act, 21 U.S.C.section 360bbb-3(b)(1), unless the authorization is terminated  or revoked sooner.       Influenza A by PCR NEGATIVE NEGATIVE Final   Influenza B by PCR NEGATIVE NEGATIVE Final    Comment: (NOTE) The Xpert Xpress SARS-CoV-2/FLU/RSV plus assay is intended as an aid in the diagnosis of influenza from Nasopharyngeal swab specimens  and should not be used as a sole basis for treatment. Nasal washings and aspirates are unacceptable for Xpert Xpress SARS-CoV-2/FLU/RSV testing.  Fact Sheet for Patients: EntrepreneurPulse.com.au  Fact Sheet for Healthcare Providers: IncredibleEmployment.be  This test is not yet approved or cleared by the [place] FDA and has been authorized for detection and/or diagnosis of SARS-CoV-2 by FDA under an Emergency Use Authorization (EUA). This EUA will remain in effect (meaning this test can be used) for the duration of the COVID-19 declaration under Section 564(b)(1) of the Act, 21 U.S.C. section 360bbb-3(b)(1), unless the authorization is terminated or revoked.  Performed at [facility], [place] [address] [place], [place] [address]     Please note: You were cared for by a hospitalist during your hospital stay. Once you are discharged, your primary care physician will handle any further medical issues. Please note that NO REFILLS for any discharge medications will be authorized once you are discharged, as it is imperative that you return to your primary care physician (or establish a relationship with a primary care physician if you do not have one) for your post hospital discharge needs so that they can reassess your need for medications and monitor your lab values.    Time coordinating discharge: 40 minutes  SIGNED:   Shelly Coss, MD  [facility] 04/11/2021, 9:12 AM Pager [phone]  If 7PM-7AM, please contact night-coverage [email or web address] Password [email or web address]

## 2021-04-11 NOTE — Progress Notes (Signed)
Noted blood glucose per BMP to be 75, reconfirmed with capillary, result was 70 mg/dl, asked patient on what he wants to drink , patient stated "I dont want anything". Explained to him that glucose is low and if he will not eat or drink I might have to reinsert IV line and he stated "No, that will never happen, I dont want to." Paged Gershon Cull NP regarding situation.

## 2021-04-11 NOTE — Progress Notes (Addendum)
Noted plans for discharge back to assisted living facility.  Patient is hemodynamically stable, tolerating p.o., no signs of recurrent hernia.  Refusing bowel regimen.  Cleared by therapies.  Stable for discharge from a surgical perspective.   Obie Dredge, PA-C [facility] Please see Amion for pager number during day hours 7:00am-4:30pm

## 2021-04-11 NOTE — Progress Notes (Signed)
Occupational Therapy Treatment Patient Details Name: [name] [name] MRN: [id number] DOB: [birth year] Today's Date: 04/11/2021   History of present illness 88 yo male who presented 04/07/21 with abdominal pain and was found to have incarcerated L inguinal hernia with SBO.  Hernia reduced in ED by general surgery. PMHx significant for bilateral inguinal hernia s/p repair (left 1962, right 1980), BPH, T2DM, GERD and multiple other medical issues.   OT comments  Patient is motivated to return to ALF today. Patient was min A to don pants and shoes with noted size difference from patient. Patient was noted to need cues for problem solving and safety awareness with tasks. Patient was able to transfer with min guard with RW with cues to keep RW close. Patient would continue to benefit from skilled OT services at this time while admitted and after d/c to address noted deficits in order to improve overall safety and independence in ADLs.     Recommendations for follow up therapy are one component of a multi-disciplinary discharge planning process, led by the attending physician.  Recommendations may be updated based on patient status, additional functional criteria and insurance authorization.    Follow Up Recommendations  Home health OT    Assistance Recommended at Discharge Frequent or constant Supervision/Assistance  Patient can return home with the following  A little help with walking and/or transfers;A little help with bathing/dressing/bathroom   Equipment Recommendations  None recommended by OT    Recommendations for Other Services      Precautions / Restrictions Precautions Precautions: Fall Restrictions Weight Bearing Restrictions: No       Mobility Bed Mobility Overal bed mobility: Needs Assistance     Sidelying to sit: Supervision       General bed mobility comments: patient was supervision for supine to sit with cues for log rolling.    Transfers                          Balance Overall balance assessment: Needs assistance Sitting-balance support: No upper extremity supported, Feet supported Sitting balance-Leahy Scale: Fair     Standing balance support: Bilateral upper extremity supported, During functional activity, Reliant on assistive device for balance Standing balance-Leahy Scale: Poor                             ADL either performed or assessed with clinical judgement   ADL Overall ADL's : Needs assistance/impaired     Grooming: Set up;Sitting     Upper Body Bathing Details (indicate cue type and reason): declined to wash up at this time     Upper Body Dressing : Set up;Sitting   Lower Body Dressing: Min guard;Sit to/from stand Lower Body Dressing Details (indicate cue type and reason): with education on donning pants over feet then shoes to prevent patients many attempts to stand with typical socks on with sliding feet. Toilet Transfer: Agricultural engineer (2 wheels) Toilet Transfer Details (indicate cue type and reason): Simulated with transfer to recliner. Cues for hand placement. needed cues to keep walker with him                Extremity/Trunk Assessment              Vision       Perception     Praxis      Cognition Arousal/Alertness: Awake/alert Behavior During Therapy: [name] for tasks assessed/performed  General Comments: patient has poor safety awareness but knows he is in the hospital.        Exercises      Shoulder Instructions       General Comments      Pertinent Vitals/ Pain       Pain Assessment Pain Assessment: Faces Faces Pain Scale: Hurts a little bit Pain Location: discomfort Pain Descriptors / Indicators: Discomfort Pain Intervention(s): Monitored during session  Home Living                                          Prior Functioning/Environment              Frequency  Min 3X/week         Progress Toward Goals  OT Goals(current goals can now be found in the care plan section)  Progress towards OT goals: Progressing toward goals     Plan Discharge plan remains appropriate    Co-evaluation                 AM-PAC OT "6 Clicks" Daily Activity     Outcome Measure   Help from another person eating meals?: A Little Help from another person taking care of personal grooming?: A Little Help from another person toileting, which includes using toliet, bedpan, or urinal?: A Little Help from another person bathing (including washing, rinsing, drying)?: A Little Help from another person to put on and taking off regular upper body clothing?: A Little Help from another person to put on and taking off regular lower body clothing?: A Little 6 Click Score: 18    End of Session Equipment Utilized During Treatment: Rolling walker (2 wheels)  OT Visit Diagnosis: Unsteadiness on feet (R26.81);Muscle weakness (generalized) (M62.81);Pain   Activity Tolerance Patient tolerated treatment well   Patient Left in chair;with call bell/phone within reach;with chair alarm set   Nurse Communication Mobility status;Other (comment) (nurse made aware of patient having skin tear re open on L lateral posterior wrist)        Time: 1040-1055 OT Time Calculation (min): 15 min  Charges: OT General Charges $OT Visit: 1 Visit OT Treatments $Self Care/Home Management : 8-22 mins  Jackelyn Poling OTR/L, MS Acute Rehabilitation Department Office# [phone] Pager# [phone]   [name] [name] 04/11/2021, 11:19 AM

## 2021-04-11 NOTE — TOC Transition Note (Signed)
Transition of Care [facility]) - CM/SW Discharge Note  Patient Details  Name: [name] [name] MRN: [id number] Date of Birth: May 03, 1928  [facility] of [facility]) CM/SW Contact:  Sherie Don, LCSW Phone Number: 04/11/2021, 12:13 PM  Clinical Narrative: Patient is medically stable to discharge back to [facility] ALF. Per patient, he does not have local family to assist with transportation and requested to go via ambulance. CSW explained that as patient walked 240 feet with PT yesterday, he does not meet medical criteria for PTAR. RN to assist with setting up transportation.  CSW discussed PT and OT recommendations for [facility]. Patient requested [facility] rather than a [place] agency. CSW followed up with Rollene Fare with [facility] and [facility] orders were faxed to her [phone]).  FL2 completed. CSW spoke with Wells Guiles at [facility] and she requested the [facility] and discharge summary be faxed [phone]). Discharge paperwork faxed to the facility. CSW updated RN. TOC signing off.  Final next level of care: Assisted Living Barriers to Discharge: Barriers Resolved  Patient Goals and CMS Choice Patient states their goals for this hospitalization and ongoing recovery are:: Return to [place] CMS Medicare.gov Compare Post Acute Care list provided to:: Patient Choice offered to / list presented to : Patient  Discharge Plan and Services In-house Referral: Clinical Social Work Post Acute Care Choice: NA          DME Arranged: N/A DME Agency: NA HH Arranged: PT, OT [place] Agency: Other - See comment Air traffic controller) Date [place]: 04/11/21 Time [place]: 1104 Representative spoke with at [place]: Rollene Fare  Readmission Risk Interventions No flowsheet data found.

## 2021-04-11 NOTE — Progress Notes (Signed)
° °  Chart reviewed for discharge.  Limited echo with severely reduced EF.  Likely has severe AS underestimated but he has no symptoms and he has not wanted conservative therapies for his other illnesses such as constipation and hernia.  He is not a candidate for any advanced therapies.  BP will not allow med titration.   Goals of out patient therapy should be discussed to establish his wishes for future treatments.  I think he would be appropriate for palliative consult as an out patient given his advanced age, frailty and severe underlying heart disease.  We will sign off.  Call with questions.

## 2021-04-12 DIAGNOSIS — R279 Unspecified lack of coordination: Secondary | ICD-10-CM | POA: Diagnosis not present

## 2021-04-12 DIAGNOSIS — R2689 Other abnormalities of gait and mobility: Secondary | ICD-10-CM | POA: Diagnosis not present

## 2021-04-12 DIAGNOSIS — R278 Other lack of coordination: Secondary | ICD-10-CM | POA: Diagnosis not present

## 2021-04-12 DIAGNOSIS — M6281 Muscle weakness (generalized): Secondary | ICD-10-CM | POA: Diagnosis not present

## 2021-04-13 DIAGNOSIS — E785 Hyperlipidemia, unspecified: Secondary | ICD-10-CM | POA: Diagnosis not present

## 2021-04-13 DIAGNOSIS — I700 Atherosclerosis of aorta: Secondary | ICD-10-CM | POA: Diagnosis not present

## 2021-04-13 DIAGNOSIS — K4090 Unilateral inguinal hernia, without obstruction or gangrene, not specified as recurrent: Secondary | ICD-10-CM | POA: Diagnosis not present

## 2021-04-13 DIAGNOSIS — Z09 Encounter for follow-up examination after completed treatment for conditions other than malignant neoplasm: Secondary | ICD-10-CM | POA: Diagnosis not present

## 2021-04-13 DIAGNOSIS — I2510 Atherosclerotic heart disease of native coronary artery without angina pectoris: Secondary | ICD-10-CM | POA: Diagnosis not present

## 2021-04-13 DIAGNOSIS — I255 Ischemic cardiomyopathy: Secondary | ICD-10-CM | POA: Diagnosis not present

## 2021-04-13 DIAGNOSIS — K56609 Unspecified intestinal obstruction, unspecified as to partial versus complete obstruction: Secondary | ICD-10-CM | POA: Diagnosis not present

## 2021-04-13 DIAGNOSIS — E1169 Type 2 diabetes mellitus with other specified complication: Secondary | ICD-10-CM | POA: Diagnosis not present

## 2021-04-13 DIAGNOSIS — I10 Essential (primary) hypertension: Secondary | ICD-10-CM | POA: Diagnosis not present

## 2021-04-13 DIAGNOSIS — E46 Unspecified protein-calorie malnutrition: Secondary | ICD-10-CM | POA: Diagnosis not present

## 2021-04-13 DIAGNOSIS — N400 Enlarged prostate without lower urinary tract symptoms: Secondary | ICD-10-CM | POA: Diagnosis not present

## 2021-04-15 DIAGNOSIS — R278 Other lack of coordination: Secondary | ICD-10-CM | POA: Diagnosis not present

## 2021-04-15 DIAGNOSIS — M6281 Muscle weakness (generalized): Secondary | ICD-10-CM | POA: Diagnosis not present

## 2021-04-15 DIAGNOSIS — R2689 Other abnormalities of gait and mobility: Secondary | ICD-10-CM | POA: Diagnosis not present

## 2021-04-15 DIAGNOSIS — R279 Unspecified lack of coordination: Secondary | ICD-10-CM | POA: Diagnosis not present

## 2021-04-16 DIAGNOSIS — M6281 Muscle weakness (generalized): Secondary | ICD-10-CM | POA: Diagnosis not present

## 2021-04-16 DIAGNOSIS — R278 Other lack of coordination: Secondary | ICD-10-CM | POA: Diagnosis not present

## 2021-04-16 DIAGNOSIS — R2689 Other abnormalities of gait and mobility: Secondary | ICD-10-CM | POA: Diagnosis not present

## 2021-04-16 DIAGNOSIS — R279 Unspecified lack of coordination: Secondary | ICD-10-CM | POA: Diagnosis not present

## 2021-04-18 DIAGNOSIS — M6281 Muscle weakness (generalized): Secondary | ICD-10-CM | POA: Diagnosis not present

## 2021-04-18 DIAGNOSIS — R2689 Other abnormalities of gait and mobility: Secondary | ICD-10-CM | POA: Diagnosis not present

## 2021-04-18 DIAGNOSIS — R278 Other lack of coordination: Secondary | ICD-10-CM | POA: Diagnosis not present

## 2021-04-18 DIAGNOSIS — R279 Unspecified lack of coordination: Secondary | ICD-10-CM | POA: Diagnosis not present

## 2021-04-20 DIAGNOSIS — R279 Unspecified lack of coordination: Secondary | ICD-10-CM | POA: Diagnosis not present

## 2021-04-20 DIAGNOSIS — R2689 Other abnormalities of gait and mobility: Secondary | ICD-10-CM | POA: Diagnosis not present

## 2021-04-20 DIAGNOSIS — R278 Other lack of coordination: Secondary | ICD-10-CM | POA: Diagnosis not present

## 2021-04-20 DIAGNOSIS — M6281 Muscle weakness (generalized): Secondary | ICD-10-CM | POA: Diagnosis not present

## 2021-04-23 DIAGNOSIS — M6281 Muscle weakness (generalized): Secondary | ICD-10-CM | POA: Diagnosis not present

## 2021-04-23 DIAGNOSIS — R278 Other lack of coordination: Secondary | ICD-10-CM | POA: Diagnosis not present

## 2021-04-23 DIAGNOSIS — R279 Unspecified lack of coordination: Secondary | ICD-10-CM | POA: Diagnosis not present

## 2021-04-23 DIAGNOSIS — R2689 Other abnormalities of gait and mobility: Secondary | ICD-10-CM | POA: Diagnosis not present

## 2021-04-24 DIAGNOSIS — R279 Unspecified lack of coordination: Secondary | ICD-10-CM | POA: Diagnosis not present

## 2021-04-24 DIAGNOSIS — R278 Other lack of coordination: Secondary | ICD-10-CM | POA: Diagnosis not present

## 2021-04-24 DIAGNOSIS — R2689 Other abnormalities of gait and mobility: Secondary | ICD-10-CM | POA: Diagnosis not present

## 2021-04-24 DIAGNOSIS — M6281 Muscle weakness (generalized): Secondary | ICD-10-CM | POA: Diagnosis not present

## 2021-04-25 DIAGNOSIS — R2689 Other abnormalities of gait and mobility: Secondary | ICD-10-CM | POA: Diagnosis not present

## 2021-04-25 DIAGNOSIS — R279 Unspecified lack of coordination: Secondary | ICD-10-CM | POA: Diagnosis not present

## 2021-04-25 DIAGNOSIS — M6281 Muscle weakness (generalized): Secondary | ICD-10-CM | POA: Diagnosis not present

## 2021-04-30 DIAGNOSIS — R279 Unspecified lack of coordination: Secondary | ICD-10-CM | POA: Diagnosis not present

## 2021-04-30 DIAGNOSIS — M6281 Muscle weakness (generalized): Secondary | ICD-10-CM | POA: Diagnosis not present

## 2021-04-30 DIAGNOSIS — R2689 Other abnormalities of gait and mobility: Secondary | ICD-10-CM | POA: Diagnosis not present

## 2021-05-02 DIAGNOSIS — R279 Unspecified lack of coordination: Secondary | ICD-10-CM | POA: Diagnosis not present

## 2021-05-02 DIAGNOSIS — R2689 Other abnormalities of gait and mobility: Secondary | ICD-10-CM | POA: Diagnosis not present

## 2021-05-02 DIAGNOSIS — M6281 Muscle weakness (generalized): Secondary | ICD-10-CM | POA: Diagnosis not present

## 2021-05-03 DIAGNOSIS — M6281 Muscle weakness (generalized): Secondary | ICD-10-CM | POA: Diagnosis not present

## 2021-05-03 DIAGNOSIS — R2689 Other abnormalities of gait and mobility: Secondary | ICD-10-CM | POA: Diagnosis not present

## 2021-05-03 DIAGNOSIS — R279 Unspecified lack of coordination: Secondary | ICD-10-CM | POA: Diagnosis not present

## 2021-05-08 DIAGNOSIS — R2689 Other abnormalities of gait and mobility: Secondary | ICD-10-CM | POA: Diagnosis not present

## 2021-05-08 DIAGNOSIS — R279 Unspecified lack of coordination: Secondary | ICD-10-CM | POA: Diagnosis not present

## 2021-05-08 DIAGNOSIS — M6281 Muscle weakness (generalized): Secondary | ICD-10-CM | POA: Diagnosis not present

## 2021-05-15 DIAGNOSIS — M6281 Muscle weakness (generalized): Secondary | ICD-10-CM | POA: Diagnosis not present

## 2021-05-15 DIAGNOSIS — R2689 Other abnormalities of gait and mobility: Secondary | ICD-10-CM | POA: Diagnosis not present

## 2021-05-15 DIAGNOSIS — R279 Unspecified lack of coordination: Secondary | ICD-10-CM | POA: Diagnosis not present

## 2021-05-16 DIAGNOSIS — R279 Unspecified lack of coordination: Secondary | ICD-10-CM | POA: Diagnosis not present

## 2021-05-16 DIAGNOSIS — R2689 Other abnormalities of gait and mobility: Secondary | ICD-10-CM | POA: Diagnosis not present

## 2021-05-16 DIAGNOSIS — M6281 Muscle weakness (generalized): Secondary | ICD-10-CM | POA: Diagnosis not present

## 2021-05-17 DIAGNOSIS — R2689 Other abnormalities of gait and mobility: Secondary | ICD-10-CM | POA: Diagnosis not present

## 2021-05-17 DIAGNOSIS — M6281 Muscle weakness (generalized): Secondary | ICD-10-CM | POA: Diagnosis not present

## 2021-05-17 DIAGNOSIS — R279 Unspecified lack of coordination: Secondary | ICD-10-CM | POA: Diagnosis not present

## 2021-05-23 DIAGNOSIS — M6281 Muscle weakness (generalized): Secondary | ICD-10-CM | POA: Diagnosis not present

## 2021-05-23 DIAGNOSIS — R279 Unspecified lack of coordination: Secondary | ICD-10-CM | POA: Diagnosis not present

## 2021-05-23 DIAGNOSIS — R2689 Other abnormalities of gait and mobility: Secondary | ICD-10-CM | POA: Diagnosis not present

## 2021-09-07 DIAGNOSIS — E785 Hyperlipidemia, unspecified: Secondary | ICD-10-CM | POA: Diagnosis not present

## 2021-09-07 DIAGNOSIS — I2510 Atherosclerotic heart disease of native coronary artery without angina pectoris: Secondary | ICD-10-CM | POA: Diagnosis not present

## 2021-09-07 DIAGNOSIS — E1169 Type 2 diabetes mellitus with other specified complication: Secondary | ICD-10-CM | POA: Diagnosis not present

## 2021-09-07 DIAGNOSIS — I10 Essential (primary) hypertension: Secondary | ICD-10-CM | POA: Diagnosis not present

## 2021-09-07 DIAGNOSIS — I509 Heart failure, unspecified: Secondary | ICD-10-CM | POA: Diagnosis not present

## 2021-09-07 DIAGNOSIS — I350 Nonrheumatic aortic (valve) stenosis: Secondary | ICD-10-CM | POA: Diagnosis not present

## 2021-09-07 DIAGNOSIS — N400 Enlarged prostate without lower urinary tract symptoms: Secondary | ICD-10-CM | POA: Diagnosis not present

## 2021-11-02 DIAGNOSIS — I2510 Atherosclerotic heart disease of native coronary artery without angina pectoris: Secondary | ICD-10-CM | POA: Diagnosis not present

## 2021-11-02 DIAGNOSIS — E1169 Type 2 diabetes mellitus with other specified complication: Secondary | ICD-10-CM | POA: Diagnosis not present

## 2021-11-02 DIAGNOSIS — L84 Corns and callosities: Secondary | ICD-10-CM | POA: Diagnosis not present

## 2021-11-02 DIAGNOSIS — N400 Enlarged prostate without lower urinary tract symptoms: Secondary | ICD-10-CM | POA: Diagnosis not present

## 2021-11-02 DIAGNOSIS — I509 Heart failure, unspecified: Secondary | ICD-10-CM | POA: Diagnosis not present

## 2021-11-02 DIAGNOSIS — E785 Hyperlipidemia, unspecified: Secondary | ICD-10-CM | POA: Diagnosis not present

## 2021-11-02 DIAGNOSIS — I10 Essential (primary) hypertension: Secondary | ICD-10-CM | POA: Diagnosis not present

## 2021-11-29 DIAGNOSIS — I2510 Atherosclerotic heart disease of native coronary artery without angina pectoris: Secondary | ICD-10-CM | POA: Diagnosis not present

## 2021-11-29 DIAGNOSIS — L84 Corns and callosities: Secondary | ICD-10-CM | POA: Diagnosis not present

## 2021-11-29 DIAGNOSIS — I10 Essential (primary) hypertension: Secondary | ICD-10-CM | POA: Diagnosis not present

## 2021-11-29 DIAGNOSIS — E1169 Type 2 diabetes mellitus with other specified complication: Secondary | ICD-10-CM | POA: Diagnosis not present

## 2021-11-29 DIAGNOSIS — I509 Heart failure, unspecified: Secondary | ICD-10-CM | POA: Diagnosis not present

## 2021-11-29 DIAGNOSIS — N400 Enlarged prostate without lower urinary tract symptoms: Secondary | ICD-10-CM | POA: Diagnosis not present

## 2021-12-10 DIAGNOSIS — B351 Tinea unguium: Secondary | ICD-10-CM | POA: Diagnosis not present

## 2021-12-10 DIAGNOSIS — E1140 Type 2 diabetes mellitus with diabetic neuropathy, unspecified: Secondary | ICD-10-CM | POA: Diagnosis not present

## 2021-12-10 DIAGNOSIS — M79672 Pain in left foot: Secondary | ICD-10-CM | POA: Diagnosis not present

## 2021-12-10 DIAGNOSIS — M79671 Pain in right foot: Secondary | ICD-10-CM | POA: Diagnosis not present

## 2021-12-10 DIAGNOSIS — L600 Ingrowing nail: Secondary | ICD-10-CM | POA: Diagnosis not present

## 2021-12-10 DIAGNOSIS — L84 Corns and callosities: Secondary | ICD-10-CM | POA: Diagnosis not present

## 2021-12-14 ENCOUNTER — Non-Acute Institutional Stay: Payer: Medicare Other | Admitting: Family Medicine

## 2021-12-14 VITALS — BP 102/50 | HR 54 | Resp 16

## 2021-12-14 DIAGNOSIS — I5022 Chronic systolic (congestive) heart failure: Secondary | ICD-10-CM

## 2021-12-14 DIAGNOSIS — Z515 Encounter for palliative care: Secondary | ICD-10-CM | POA: Diagnosis not present

## 2021-12-14 DIAGNOSIS — R634 Abnormal weight loss: Secondary | ICD-10-CM | POA: Diagnosis not present

## 2021-12-14 NOTE — Progress Notes (Signed)
[facility] Palliative Care Consult Note Telephone: [phone]  Fax: [phone]    Date of encounter: 12/14/21 11:14 AM PATIENT NAME: [name] [name] [address] [place] [address]   [phone] (home)  DOB: 1929-01-24 MRN: [id number] PRIMARY CARE PROVIDER:    Antony Contras, MD,  [address] [place] [address] [phone]  REFERRING PROVIDER:   Antony Contras, MD [place],  [place] [address] [phone]  RESPONSIBLE PARTY:    Contact Information     Name Relation Home Work Mobile   [place] [phone]          I met face to face with patient in assisted living facility. Palliative Care was asked to follow this patient by consultation request of  Antony Contras, MD to address advance care planning and complex medical decision making. This is a follow up visit   ASSESSMENT , SYMPTOM MANAGEMENT AND PLAN / RECOMMENDATIONS:   Chronic systolic CHF Appears Euvolemic at present with low normal BP Continue Clopidrogel due to presence of stents, may consider d/c of med if and when decides to transition to Hospice level Care. Due to bradycardia, pt is not on any rate controlling med.  ARB/ACE-I or diuretic contraindicated with current BP.   Unintended weight loss Albumin has been stable at around 3 when last checked 03/2021. Encourage protein supplements TID to increase fluid volume and provide protein.  3.  Palliative Care Encounter  Reviewed MOST form with pt and he is not ready to commit to Hospice at present.  Advance Care Planning/Goals of Care: Goals include to maximize quality of life and symptom management.  Review of an existing advance directive document-DNR . Reviewed options for MOST and pt is not ready to commit to completion Decision not to resuscitate or to de-escalate disease focused treatments due to poor  prognosis. CODE STATUS: DNR     Follow up Palliative Care Visit: Palliative care will continue to follow for complex medical decision making, advance care planning, and clarification of goals. Return 4 weeks or prn.   This visit was coded based on medical decision making (MDM).  PPS: 50%  HOSPICE ELIGIBILITY/DIAGNOSIS: TBD  Chief Complaint:  Acute follow up visit requested per facility nurse to evaluate potential decline for hospice eligibility in setting of chronic systolic heart failure and aortic stenosis.  HISTORY OF PRESENT ILLNESS:  [name] [name] is a 88 y.o. year old male with chronic systolic CHF and aortic stenosis with sinus bradycardia, unintended weight loss, thrombocytopenia, allergic rhinitis, GERD,  DM, BPH, and hx of SBO. Facility nurse requested pt be evaluated for possible admission to hospice services given increased sleep and poor intake.  Feels "fair".  Denies CP, SOB. Has orthopnea, no PND. Denies nausea and vomiting recently. No problem.  Has weaker stream.  Denies hematuria and constipation, has a history of kidney stones. Feels full with meals and can't eat as much. Wants to come off Plavix but states he is not ready to consider hospice.  Advised pt not to come off Plavix as this is to keep his stents open. Educated to notify about CP, SOB, PND or worsening orthopnea.  Pt c/o fatigue and sleeps a lot.  He was inpatient in February 2023 for SBO and has hx of multiple abdominal surgeries/recurrent left inguinal hernia.  Hernia was reduced by General surgery in the ER and he has been placed on  a bowel regimen. He was also noted to have urinary retention at that time, foley was placed and pt started on Tamsulosin with successful foley removal. Pt still bathing and dressing himself, going to meals and also advised he is "not ready" for Hospice.  History obtained from review of EMR, discussion with primary team, facility staff and/or [name] [name].   04/09/21 Complete Echo: 1.  Left ventricular ejection fraction, is 25 to 30%. The  left ventricle has severely decreased function and demonstrates regional wall motion abnormalities (see scoring  diagram/findings for description). There is mild left   ventricular hypertrophy. Left ventricular diastolic parameters are consistent with Grade II diastolic dysfunction.  Elevated left atrial pressure.   2. Akinetic apex not well visualized, recommend limited echo with contrast to rule out apical thrombus (done and ruled out on 04/10/21)  3. Right ventricular systolic function is normal. The right ventricular size is mildly enlarged.   4. Left atrial size was moderately dilated. Right atrial size was mildly dilated.   6. The mitral valve is grossly normal. Trivial mitral valve  Regurgitation without stenosis.   7. Aortic dilatation of the ascending aorta, measuring 44 mm.   8. Severe calcifcation of the aortic valve with trivial regurgitation and moderate aortic valve stenosis. Mild AS by gradients (Vmax 2.2 m/s, MG 12 mmHg), moderate by AVA  (1.4 cm^2) and DI (0.27). Given severe systolic dysfunction, suspect low flow low gradient moderate AS   Component Ref Range & Units 8 mo ago (04/11/21) 8 mo ago (04/10/21) 8 mo ago (04/09/21) 8 mo ago (04/08/21) 8 mo ago (04/07/21) 1 yr ago (02/09/20) 4 yr ago (11/17/17)  WBC 4.0 - 10.5 K/uL 2.6 Low  3.6 Low  3.5 Low  4.5 2.9 Low   5.8  RBC 4.22 - 5.81 MIL/uL 3.45 Low  3.30 Low  3.21 Low  3.30 Low  3.77 Low   4.02 Low   Hemoglobin 13.0 - 17.0 g/dL 10.8 Low  10.5 Low  10.1 Low  10.3 Low  11.8 Low  12.6 Low  12.8 Low   HCT 39.0 - 52.0 % 32.3 Low  31.8 Low  31.0 Low  31.6 Low  35.5 Low  37.0 Low  37.0 Low   MCV 80.0 - 100.0 fL 93.6 96.4 96.6 95.8 94.2  92.0 R  MCH 26.0 - 34.0 pg 31.3 31.8 31.5 31.2 31.3  31.8  MCHC 30.0 - 36.0 g/dL 33.4 33.0 32.6 32.6 33.2  34.6  RDW 11.5 - 15.5 % 15.2 15.2 15.7 High  15.8 High  15.7 High   15.2  Platelets 150 - 400 K/uL 110 Low  105 Low  CM 105 Low  CM 115  Low  CM 127 Low  CM  225 CM   Component Ref Range & Units 8 mo ago (04/11/21) 8 mo ago (04/10/21) 8 mo ago (04/09/21) 8 mo ago (04/08/21) 8 mo ago (04/07/21) 1 yr ago (02/09/20) 4 yr ago (11/17/17)  Sodium 135 - 145 mmol/L 136 134 Low  136 137 137 141 139  Potassium 3.5 - 5.1 mmol/L 3.9 3.6 4.0 4.6 4.0 4.1 4.0  Chloride 98 - 111 mmol/L 103 100 104 103 100 106 104  CO2 22 - 32 mmol/L '29 30 29 30 30  26  ' Glucose, Bld 70 - 99 mg/dL 75 108 High  CM 77 CM 88 CM 143 High  CM 88 CM 114 High   Comment: Glucose reference range applies only to samples taken after fasting for at least  8 hours.  BUN 8 - 23 mg/dL '10 11 15 16 18 16 13  ' Creatinine, Ser 0.61 - 1.24 mg/dL 0.70 0.61 0.55 Low  0.73 0.70 0.60 Low  1.32 High   Calcium 8.9 - 10.3 mg/dL 8.4 Low  8.3 Low  8.4 Low  8.7 Low  9.1  9.0  Total Protein 6.5 - 8.1 g/dL 5.5 Low  5.4 Low  5.3 Low  5.9 Low  6.8  6.4 Low   Albumin 3.5 - 5.0 g/dL 3.0 Low  3.0 Low  3.2 Low  3.4 Low  4.0  3.6  AST 15 - 41 U/L '25 25 24 18 17  ' 34  ALT 0 - 44 U/L '14 13 10 11 11  ' 62 High   Alkaline Phosphatase 38 - 126 U/L 55 51 47 53 64  76  Total Bilirubin 0.3 - 1.2 mg/dL 0.8 0.8 1.0 0.5 0.7  3.5 High   GFR, Estimated >60 mL/min >60 >60 CM >60 CM >60 CM >60 CM    Comment: (NOTE) Calculated using the CKD-EPI Creatinine Equation (2021)  Anion gap 5 - 15 4 Low  4 Low  CM 3 Low  CM 4 Low  CM 7 CM  9 CM   I reviewed EMR for available labs, medications, imaging, studies and related documents.  There are no new records since last visit/Records reviewed and summarized above.   ROS General: NAD EYES: denies vision changes ENMT: denies dysphagia Cardiovascular: denies chest pain, denies DOE Pulmonary: denies cough, denies increased SOB Abdomen: endorses good appetite, denies constipation, endorses continence of bowel GU: denies dysuria, endorses continence of urine MSK:  denies increased weakness,  no falls reported Skin: denies rashes or wounds Neurological: denies pain, denies  insomnia Psych: Endorses positive mood Heme/lymph/immuno: denies bruises, abnormal bleeding  Physical Exam: Current and past weights: 04/11/21 111 lbs 15.9 ounces, current weight 100.2 lbs Constitutional: NAD General: frail appearing, cachectic ENMT: hard of hearing CV: S1S2, RRR, no LE edema Pulmonary: CTAB, no increased work of breathing, no cough, room air Abdomen: Hypo-active BS + 4 quadrants, soft and non tender, no ascites MSK: noted generalized sarcopenia, moves all extremities, ambulatory Skin: warm and dry, dry skin patches on BLE Neuro:  no generalized weakness,  no cognitive impairment Psych: non-anxious affect, A and O x 3 Hem/lymph/immuno: no widespread bruising   Thank you for the opportunity to participate in the care of [name] [name].  The palliative care team will continue to follow. Please call our office at [phone] if we can be of additional assistance.   Marijo Conception, FNP -C  COVID-19 PATIENT SCREENING TOOL Asked and negative response unless otherwise noted:   Have you had symptoms of covid, tested positive or been in contact with someone with symptoms/positive test in the past 5-10 days?  No

## 2021-12-19 DIAGNOSIS — N400 Enlarged prostate without lower urinary tract symptoms: Secondary | ICD-10-CM | POA: Diagnosis not present

## 2021-12-19 DIAGNOSIS — E782 Mixed hyperlipidemia: Secondary | ICD-10-CM | POA: Diagnosis not present

## 2021-12-19 DIAGNOSIS — I10 Essential (primary) hypertension: Secondary | ICD-10-CM | POA: Diagnosis not present

## 2021-12-19 DIAGNOSIS — Z79899 Other long term (current) drug therapy: Secondary | ICD-10-CM | POA: Diagnosis not present

## 2021-12-19 DIAGNOSIS — E1169 Type 2 diabetes mellitus with other specified complication: Secondary | ICD-10-CM | POA: Diagnosis not present

## 2021-12-19 DIAGNOSIS — E038 Other specified hypothyroidism: Secondary | ICD-10-CM | POA: Diagnosis not present

## 2021-12-19 DIAGNOSIS — E119 Type 2 diabetes mellitus without complications: Secondary | ICD-10-CM | POA: Diagnosis not present

## 2021-12-19 DIAGNOSIS — I509 Heart failure, unspecified: Secondary | ICD-10-CM | POA: Diagnosis not present

## 2021-12-19 DIAGNOSIS — E785 Hyperlipidemia, unspecified: Secondary | ICD-10-CM | POA: Diagnosis not present

## 2021-12-19 DIAGNOSIS — D518 Other vitamin B12 deficiency anemias: Secondary | ICD-10-CM | POA: Diagnosis not present

## 2021-12-19 DIAGNOSIS — I2510 Atherosclerotic heart disease of native coronary artery without angina pectoris: Secondary | ICD-10-CM | POA: Diagnosis not present

## 2021-12-28 DIAGNOSIS — I10 Essential (primary) hypertension: Secondary | ICD-10-CM | POA: Diagnosis not present

## 2021-12-28 DIAGNOSIS — E1169 Type 2 diabetes mellitus with other specified complication: Secondary | ICD-10-CM | POA: Diagnosis not present

## 2021-12-28 DIAGNOSIS — I509 Heart failure, unspecified: Secondary | ICD-10-CM | POA: Diagnosis not present

## 2021-12-28 DIAGNOSIS — I2510 Atherosclerotic heart disease of native coronary artery without angina pectoris: Secondary | ICD-10-CM | POA: Diagnosis not present

## 2021-12-28 DIAGNOSIS — E785 Hyperlipidemia, unspecified: Secondary | ICD-10-CM | POA: Diagnosis not present

## 2021-12-28 DIAGNOSIS — N400 Enlarged prostate without lower urinary tract symptoms: Secondary | ICD-10-CM | POA: Diagnosis not present

## 2021-12-31 ENCOUNTER — Encounter: Payer: Self-pay | Admitting: Family Medicine

## 2021-12-31 DIAGNOSIS — Z515 Encounter for palliative care: Secondary | ICD-10-CM | POA: Insufficient documentation

## 2022-01-22 DIAGNOSIS — E782 Mixed hyperlipidemia: Secondary | ICD-10-CM | POA: Diagnosis not present

## 2022-01-22 DIAGNOSIS — I509 Heart failure, unspecified: Secondary | ICD-10-CM | POA: Diagnosis not present

## 2022-01-22 DIAGNOSIS — E1169 Type 2 diabetes mellitus with other specified complication: Secondary | ICD-10-CM | POA: Diagnosis not present

## 2022-01-22 DIAGNOSIS — I10 Essential (primary) hypertension: Secondary | ICD-10-CM | POA: Diagnosis not present

## 2022-01-22 DIAGNOSIS — N400 Enlarged prostate without lower urinary tract symptoms: Secondary | ICD-10-CM | POA: Diagnosis not present

## 2022-01-22 DIAGNOSIS — E038 Other specified hypothyroidism: Secondary | ICD-10-CM | POA: Diagnosis not present

## 2022-01-22 DIAGNOSIS — D518 Other vitamin B12 deficiency anemias: Secondary | ICD-10-CM | POA: Diagnosis not present

## 2022-01-22 DIAGNOSIS — E785 Hyperlipidemia, unspecified: Secondary | ICD-10-CM | POA: Diagnosis not present

## 2022-01-22 DIAGNOSIS — I2510 Atherosclerotic heart disease of native coronary artery without angina pectoris: Secondary | ICD-10-CM | POA: Diagnosis not present

## 2022-01-22 DIAGNOSIS — E119 Type 2 diabetes mellitus without complications: Secondary | ICD-10-CM | POA: Diagnosis not present

## 2022-01-24 DIAGNOSIS — I2510 Atherosclerotic heart disease of native coronary artery without angina pectoris: Secondary | ICD-10-CM | POA: Diagnosis not present

## 2022-01-24 DIAGNOSIS — E1140 Type 2 diabetes mellitus with diabetic neuropathy, unspecified: Secondary | ICD-10-CM | POA: Diagnosis not present

## 2022-01-24 DIAGNOSIS — I10 Essential (primary) hypertension: Secondary | ICD-10-CM | POA: Diagnosis not present

## 2022-01-24 DIAGNOSIS — N400 Enlarged prostate without lower urinary tract symptoms: Secondary | ICD-10-CM | POA: Diagnosis not present

## 2022-01-24 DIAGNOSIS — I509 Heart failure, unspecified: Secondary | ICD-10-CM | POA: Diagnosis not present

## 2022-02-15 DIAGNOSIS — N400 Enlarged prostate without lower urinary tract symptoms: Secondary | ICD-10-CM | POA: Diagnosis not present

## 2022-02-15 DIAGNOSIS — E1169 Type 2 diabetes mellitus with other specified complication: Secondary | ICD-10-CM | POA: Diagnosis not present

## 2022-02-15 DIAGNOSIS — E038 Other specified hypothyroidism: Secondary | ICD-10-CM | POA: Diagnosis not present

## 2022-02-15 DIAGNOSIS — E119 Type 2 diabetes mellitus without complications: Secondary | ICD-10-CM | POA: Diagnosis not present

## 2022-02-15 DIAGNOSIS — I509 Heart failure, unspecified: Secondary | ICD-10-CM | POA: Diagnosis not present

## 2022-02-15 DIAGNOSIS — E785 Hyperlipidemia, unspecified: Secondary | ICD-10-CM | POA: Diagnosis not present

## 2022-02-15 DIAGNOSIS — I10 Essential (primary) hypertension: Secondary | ICD-10-CM | POA: Diagnosis not present

## 2022-02-15 DIAGNOSIS — I2510 Atherosclerotic heart disease of native coronary artery without angina pectoris: Secondary | ICD-10-CM | POA: Diagnosis not present

## 2022-02-15 DIAGNOSIS — D518 Other vitamin B12 deficiency anemias: Secondary | ICD-10-CM | POA: Diagnosis not present

## 2022-02-15 DIAGNOSIS — E782 Mixed hyperlipidemia: Secondary | ICD-10-CM | POA: Diagnosis not present

## 2022-02-26 ENCOUNTER — Emergency Department (HOSPITAL_COMMUNITY): Payer: Medicare Other

## 2022-02-26 ENCOUNTER — Encounter (HOSPITAL_COMMUNITY): Payer: Self-pay | Admitting: Internal Medicine

## 2022-02-26 ENCOUNTER — Other Ambulatory Visit: Payer: Self-pay

## 2022-02-26 ENCOUNTER — Observation Stay (HOSPITAL_COMMUNITY)
Admission: EM | Admit: 2022-02-26 | Discharge: 2022-02-28 | Payer: Medicare Other | Attending: Internal Medicine | Admitting: Internal Medicine

## 2022-02-26 DIAGNOSIS — Z66 Do not resuscitate: Secondary | ICD-10-CM | POA: Insufficient documentation

## 2022-02-26 DIAGNOSIS — W01198A Fall on same level from slipping, tripping and stumbling with subsequent striking against other object, initial encounter: Secondary | ICD-10-CM | POA: Insufficient documentation

## 2022-02-26 DIAGNOSIS — W19XXXA Unspecified fall, initial encounter: Secondary | ICD-10-CM | POA: Diagnosis not present

## 2022-02-26 DIAGNOSIS — M4313 Spondylolisthesis, cervicothoracic region: Secondary | ICD-10-CM | POA: Diagnosis not present

## 2022-02-26 DIAGNOSIS — R7989 Other specified abnormal findings of blood chemistry: Secondary | ICD-10-CM | POA: Diagnosis not present

## 2022-02-26 DIAGNOSIS — R778 Other specified abnormalities of plasma proteins: Secondary | ICD-10-CM | POA: Diagnosis not present

## 2022-02-26 DIAGNOSIS — Z79899 Other long term (current) drug therapy: Secondary | ICD-10-CM | POA: Diagnosis not present

## 2022-02-26 DIAGNOSIS — Z1152 Encounter for screening for COVID-19: Secondary | ICD-10-CM | POA: Insufficient documentation

## 2022-02-26 DIAGNOSIS — R64 Cachexia: Secondary | ICD-10-CM | POA: Insufficient documentation

## 2022-02-26 DIAGNOSIS — I110 Hypertensive heart disease with heart failure: Secondary | ICD-10-CM | POA: Diagnosis not present

## 2022-02-26 DIAGNOSIS — Z7902 Long term (current) use of antithrombotics/antiplatelets: Secondary | ICD-10-CM | POA: Insufficient documentation

## 2022-02-26 DIAGNOSIS — I517 Cardiomegaly: Secondary | ICD-10-CM | POA: Diagnosis not present

## 2022-02-26 DIAGNOSIS — M160 Bilateral primary osteoarthritis of hip: Secondary | ICD-10-CM | POA: Diagnosis not present

## 2022-02-26 DIAGNOSIS — J45909 Unspecified asthma, uncomplicated: Secondary | ICD-10-CM | POA: Insufficient documentation

## 2022-02-26 DIAGNOSIS — Z955 Presence of coronary angioplasty implant and graft: Secondary | ICD-10-CM | POA: Insufficient documentation

## 2022-02-26 DIAGNOSIS — I959 Hypotension, unspecified: Secondary | ICD-10-CM | POA: Diagnosis not present

## 2022-02-26 DIAGNOSIS — R627 Adult failure to thrive: Secondary | ICD-10-CM | POA: Diagnosis not present

## 2022-02-26 DIAGNOSIS — R9431 Abnormal electrocardiogram [ECG] [EKG]: Secondary | ICD-10-CM | POA: Diagnosis not present

## 2022-02-26 DIAGNOSIS — I10 Essential (primary) hypertension: Secondary | ICD-10-CM | POA: Diagnosis present

## 2022-02-26 DIAGNOSIS — S098XXA Other specified injuries of head, initial encounter: Secondary | ICD-10-CM | POA: Diagnosis not present

## 2022-02-26 DIAGNOSIS — N400 Enlarged prostate without lower urinary tract symptoms: Secondary | ICD-10-CM | POA: Diagnosis present

## 2022-02-26 DIAGNOSIS — E119 Type 2 diabetes mellitus without complications: Secondary | ICD-10-CM | POA: Insufficient documentation

## 2022-02-26 DIAGNOSIS — Y92121 Bathroom in nursing home as the place of occurrence of the external cause: Secondary | ICD-10-CM | POA: Insufficient documentation

## 2022-02-26 DIAGNOSIS — E785 Hyperlipidemia, unspecified: Secondary | ICD-10-CM | POA: Diagnosis present

## 2022-02-26 DIAGNOSIS — S50311A Abrasion of right elbow, initial encounter: Secondary | ICD-10-CM | POA: Diagnosis present

## 2022-02-26 DIAGNOSIS — Z043 Encounter for examination and observation following other accident: Secondary | ICD-10-CM | POA: Diagnosis not present

## 2022-02-26 DIAGNOSIS — Z87891 Personal history of nicotine dependence: Secondary | ICD-10-CM | POA: Diagnosis not present

## 2022-02-26 DIAGNOSIS — I2510 Atherosclerotic heart disease of native coronary artery without angina pectoris: Secondary | ICD-10-CM | POA: Diagnosis not present

## 2022-02-26 DIAGNOSIS — I5022 Chronic systolic (congestive) heart failure: Secondary | ICD-10-CM | POA: Insufficient documentation

## 2022-02-26 DIAGNOSIS — K219 Gastro-esophageal reflux disease without esophagitis: Secondary | ICD-10-CM | POA: Diagnosis present

## 2022-02-26 DIAGNOSIS — R531 Weakness: Secondary | ICD-10-CM | POA: Diagnosis not present

## 2022-02-26 LAB — CBG MONITORING, ED: Glucose-Capillary: 86 mg/dL (ref 70–99)

## 2022-02-26 LAB — COMPREHENSIVE METABOLIC PANEL
ALT: 13 U/L (ref 0–44)
AST: 24 U/L (ref 15–41)
Albumin: 3.2 g/dL — ABNORMAL LOW (ref 3.5–5.0)
Alkaline Phosphatase: 44 U/L (ref 38–126)
Anion gap: 7 (ref 5–15)
BUN: 28 mg/dL — ABNORMAL HIGH (ref 8–23)
CO2: 29 mmol/L (ref 22–32)
Calcium: 8.6 mg/dL — ABNORMAL LOW (ref 8.9–10.3)
Chloride: 102 mmol/L (ref 98–111)
Creatinine, Ser: 0.81 mg/dL (ref 0.61–1.24)
GFR, Estimated: >60 mL/min (ref >60)
Glucose, Bld: 82 mg/dL (ref 70–99)
Potassium: 4.3 mmol/L (ref 3.5–5.1)
Sodium: 138 mmol/L (ref 135–145)
Total Bilirubin: 0.6 mg/dL (ref 0.3–1.2)
Total Protein: 6.3 g/dL — ABNORMAL LOW (ref 6.5–8.1)

## 2022-02-26 LAB — CBC WITH DIFFERENTIAL/PLATELET
Abs Immature Granulocytes: 0.02 10*3/uL (ref 0.00–0.07)
Basophils Absolute: 0 10*3/uL (ref 0.0–0.1)
Basophils Relative: 0 %
Eosinophils Absolute: 0 10*3/uL (ref 0.0–0.5)
Eosinophils Relative: 0 %
HCT: 34.2 % — ABNORMAL LOW (ref 39.0–52.0)
Hemoglobin: 11.3 g/dL — ABNORMAL LOW (ref 13.0–17.0)
Immature Granulocytes: 0 %
Lymphocytes Relative: 5 %
Lymphs Abs: 0.3 10*3/uL — ABNORMAL LOW (ref 0.7–4.0)
MCH: 31.2 pg (ref 26.0–34.0)
MCHC: 33 g/dL (ref 30.0–36.0)
MCV: 94.5 fL (ref 80.0–100.0)
Monocytes Absolute: 0.2 10*3/uL (ref 0.1–1.0)
Monocytes Relative: 3 %
Neutro Abs: 5.4 10*3/uL (ref 1.7–7.7)
Neutrophils Relative %: 92 %
Platelets: 121 10*3/uL — ABNORMAL LOW (ref 150–400)
RBC: 3.62 MIL/uL — ABNORMAL LOW (ref 4.22–5.81)
RDW: 16.6 % — ABNORMAL HIGH (ref 11.5–15.5)
WBC: 5.9 10*3/uL (ref 4.0–10.5)
nRBC: 0 % (ref 0.0–0.2)

## 2022-02-26 LAB — RESP PANEL BY RT-PCR (RSV, FLU A&B, COVID)  RVPGX2
Influenza A by PCR: NEGATIVE
Influenza B by PCR: NEGATIVE
Resp Syncytial Virus by PCR: NEGATIVE
SARS Coronavirus 2 by RT PCR: NEGATIVE

## 2022-02-26 LAB — TROPONIN I (HIGH SENSITIVITY)
Troponin I (High Sensitivity): 299 ng/L (ref <18)
Troponin I (High Sensitivity): 554 ng/L (ref <18)
Troponin I (High Sensitivity): 682 ng/L (ref <18)

## 2022-02-26 LAB — TYPE AND SCREEN
ABO/RH(D): B POS
Antibody Screen: NEGATIVE

## 2022-02-26 MED ORDER — ONDANSETRON HCL 4 MG/2ML IJ SOLN: 4.0000 mg | Freq: Four times a day (QID) | INTRAMUSCULAR | Status: DC | PRN

## 2022-02-26 MED ORDER — ACETAMINOPHEN 325 MG PO TABS
650.0000 mg | ORAL_TABLET | Freq: Four times a day (QID) | ORAL | Status: DC | PRN
  Filled 2022-02-26: qty 2

## 2022-02-26 MED ORDER — DOCUSATE SODIUM 100 MG PO CAPS
100.0000 mg | ORAL_CAPSULE | Freq: Every day | ORAL | Status: DC
  Filled 2022-02-26: qty 1

## 2022-02-26 MED ORDER — ONDANSETRON HCL 4 MG PO TABS: 4.0000 mg | ORAL_TABLET | Freq: Four times a day (QID) | ORAL | Status: DC | PRN

## 2022-02-26 MED ORDER — CLOPIDOGREL BISULFATE 75 MG PO TABS
75.0000 mg | ORAL_TABLET | Freq: Every day | ORAL | Status: DC
  Filled 2022-02-26 (×3): qty 1

## 2022-02-26 MED ORDER — ENOXAPARIN SODIUM 40 MG/0.4ML IJ SOSY
40.0000 mg | PREFILLED_SYRINGE | INTRAMUSCULAR | Status: DC
  Administered 2022-02-27: 40 mg via SUBCUTANEOUS
  Filled 2022-02-26: qty 0.4

## 2022-02-26 MED ORDER — ACETAMINOPHEN 325 MG PO TABS
650.0000 mg | ORAL_TABLET | ORAL | Status: DC | PRN
  Administered 2022-02-27: 650 mg via ORAL

## 2022-02-26 MED ORDER — TAMSULOSIN HCL 0.4 MG PO CAPS: 0.4000 mg | ORAL_CAPSULE | Freq: Every day | ORAL | Status: DC

## 2022-02-26 MED ORDER — ASPIRIN 81 MG PO TBEC: 81.0000 mg | DELAYED_RELEASE_TABLET | Freq: Every day | ORAL | Status: DC

## 2022-02-26 MED ORDER — POLYETHYLENE GLYCOL 3350 17 G PO PACK
17.0000 g | PACK | Freq: Two times a day (BID) | ORAL | Status: DC
  Filled 2022-02-26: qty 1

## 2022-02-26 MED ORDER — SODIUM CHLORIDE 0.9 % IV BOLUS
500.0000 mL | Freq: Once | INTRAVENOUS | Status: AC
  Administered 2022-02-26: 500 mL via INTRAVENOUS

## 2022-02-26 MED ORDER — NITROGLYCERIN 0.4 MG SL SUBL: 0.4000 mg | SUBLINGUAL_TABLET | SUBLINGUAL | Status: DC | PRN

## 2022-02-26 MED ORDER — ENSURE ENLIVE PO LIQD
237.0000 mL | Freq: Two times a day (BID) | ORAL | Status: DC
  Administered 2022-02-27 – 2022-02-28 (×3): 237 mL via ORAL
  Filled 2022-02-26 (×4): qty 237

## 2022-02-26 MED ORDER — ENOXAPARIN SODIUM 40 MG/0.4ML IJ SOSY: 40.0000 mg | PREFILLED_SYRINGE | INTRAMUSCULAR | Status: DC

## 2022-02-26 MED ORDER — ACETAMINOPHEN 650 MG RE SUPP: 650.0000 mg | Freq: Four times a day (QID) | RECTAL | Status: DC | PRN

## 2022-02-26 MED ORDER — ASPIRIN 300 MG RE SUPP: 300.0000 mg | RECTAL | Status: AC

## 2022-02-26 MED ORDER — ASPIRIN 81 MG PO CHEW
324.0000 mg | CHEWABLE_TABLET | ORAL | Status: AC
  Filled 2022-02-26: qty 4

## 2022-02-26 NOTE — ED Triage Notes (Signed)
Pt bib ems from [facility] after unwitnessed fall. Pt denies loc, denies hitting his head. Per ems facility reported failure to thrive.

## 2022-02-26 NOTE — ED Notes (Addendum)
A nurse, Rise Paganini, from [facility] came to talk to pt and he was agreeable to being placed in hospice care with her. [facility] is the preferred hospice service for [facility], the pts current care service.   Contact information for [place], [place] Office: [phone] Cell: [phone]  Fax: [phone] Email: [email or web address]

## 2022-02-26 NOTE — ED Notes (Signed)
Attempted in and out cath x 2, unsuccessful.  

## 2022-02-26 NOTE — ED Provider Notes (Signed)
[name] [name] DEPT Provider Note   CSN: [id number] Arrival date & time: 02/26/22  2248     History  Chief Complaint  Patient presents with   [name] [name] is a 89 y.o. male.  89 year old male with prior medical history as detailed below presents for evaluation.  Patient arrives by EMS transport from [facility].  Patient reports that he got up to go to the bathroom this morning and lost his balance and fell forward.  He did strike his head.  He denies LOC.  He denies significant injury from the fall.  Patient does take Plavix daily.  EMS reports that staff at [facility] had noted that the patient has had significant decreased p.o. intake for the last 3 to 4 days.  Patient reports disinterest in eating.  CODE STATUS discussed with patient.  Patient confirms DNR.  Patient confirms that he wants no aggressive measures.  He is primarily interested in comfort care.  Patient has been previously evaluated by [facility] care service and apparently was not yet ready for hospice per note from October 2023.  The history is provided by the patient and the EMS personnel.       Home Medications Prior to Admission medications   Medication Sig Start Date End Date Taking? Authorizing Provider  clopidogrel (PLAVIX) 75 MG tablet Take 1 tablet (75 mg total) by mouth daily with breakfast. 11/26/13   Adrian Prows, MD  docusate sodium (COLACE) 100 MG capsule Take 1 capsule (100 mg total) by mouth daily. 10/15/17   Caccavale, Sophia, PA-C  polyethylene glycol (MIRALAX / GLYCOLAX) 17 g packet Take 17 g by mouth 2 (two) times daily. 04/11/21   Shelly Coss, MD  tamsulosin (FLOMAX) 0.4 MG CAPS capsule Take 1 capsule (0.4 mg total) by mouth daily. 04/11/21   Shelly Coss, MD      Allergies    Lipitor [atorvastatin calcium]    Review of Systems   Review of Systems  All other systems reviewed and are negative.   Physical Exam Updated Vital Signs Ht  '5\' 10"'$  (1.778 m)   Wt 50 kg   BMI 15.82 kg/m  Physical Exam Vitals and nursing note reviewed.  Constitutional:      General: He is not in acute distress.    Appearance: He is well-developed.     Comments: Alert, cachectic, chronically ill in appearance  HENT:     Head: Normocephalic and atraumatic.  Eyes:     Conjunctiva/sclera: Conjunctivae normal.     Pupils: Pupils are equal, round, and reactive to light.  Cardiovascular:     Rate and Rhythm: Normal rate and regular rhythm.     Heart sounds: Normal heart sounds.  Pulmonary:     Effort: Pulmonary effort is normal. No respiratory distress.     Breath sounds: Normal breath sounds.  Abdominal:     General: There is no distension.     Palpations: Abdomen is soft.     Tenderness: There is no abdominal tenderness.  Musculoskeletal:        General: No deformity. Normal range of motion.     Cervical back: Normal range of motion and neck supple.     Comments: Full active range of motion of all joints of the upper and lower extremities.  Skin:    General: Skin is warm and dry.     Comments: Superficial abrasions to the right lateral elbow.  Neurological:     General:  No focal deficit present.     Mental Status: He is alert and oriented to person, place, and time. Mental status is at baseline.     ED Results / Procedures / Treatments   Labs (all labs ordered are listed, but only abnormal results are displayed) Labs Reviewed  CBC WITH DIFFERENTIAL/PLATELET - Abnormal; Notable for the following components:      Result Value   RBC 3.62 (*)    Hemoglobin 11.3 (*)    HCT 34.2 (*)    RDW 16.6 (*)    Platelets 121 (*)    Lymphs Abs 0.3 (*)    All other components within normal limits  COMPREHENSIVE METABOLIC PANEL - Abnormal; Notable for the following components:   BUN 28 (*)    Calcium 8.6 (*)    Total Protein 6.3 (*)    Albumin 3.2 (*)    All other components within normal limits  TROPONIN I (HIGH SENSITIVITY) - Abnormal;  Notable for the following components:   Troponin I (High Sensitivity) 299 (*)    All other components within normal limits  RESP PANEL BY RT-PCR (RSV, FLU A&B, COVID)  [id number]  URINALYSIS, ROUTINE W REFLEX MICROSCOPIC  TYPE AND SCREEN  TROPONIN I (HIGH SENSITIVITY)    EKG None  Radiology No results found.  Procedures Procedures    Medications Ordered in ED Medications  sodium chloride 0.9 % bolus 500 mL (has no administration in time range)    ED Course/ Medical Decision Making/ A&P                           Medical Decision Making Amount and/or Complexity of Data Reviewed Labs: ordered. Radiology: ordered.    Medical Screen Complete  This patient presented to the ED with complaint of fall, failure to thrive.  This complaint involves an extensive number of treatment options. The initial differential diagnosis includes, but is not limited to, trauma related to fall, dehydration, metabolic abnormality, ACS, infection, etc.  This presentation is: Acute, Chronic, Self-Limited, Previously Undiagnosed, Uncertain Prognosis, Complicated, Systemic Symptoms, and Threat to Life/Bodily Function  Patient is presenting from [facility] after apparent fall.  Patient reports that he got up to use the restroom and was so weak that he fell.  He did strike his head.  EMS reports that staff at [facility] are concerned about patient's decline in function over the last several days with significant decrease in p.o. intake.  Patient is alert and competent.  He reports that he has disinterest in eating.  He denies any significant complaint on exam.  Patient is cachectic in appearance.  Patient has several transient hypotensive readings on serial blood pressure monitoring.  Patient appears to be asymptomatic during these transient hypotensive episodes.  Patient is adamant about his DNR status.  He appears to be primarily interested in comfort measures only.  He is repeatedly not interested  in any aggressive measures to prolong his life.  Labs are notable for initial elevated trop on 299.  Patient without chest pain.  EKG is without evidence of acute ischemic changes.  Second troponin is also elevated at 554.  Case discussed briefly with Dr. Marlou Porch covering cardiology.  Dr. Marlou Porch does not feel the patient would benefit from heparinization at this time.  He recommends continued trending of troponin.  Cardiology happy to consult if requested by hospital service.  Patient is not appropriate for discharge back to assisted living at [facility]  Patient would benefit from admission.  Patient would likely benefit from palliative care consult.  Hospitalist service aware case will evaluate.  Additional history obtained:  Additional history obtained from EMS External records from outside sources obtained and reviewed including prior ED visits and prior Inpatient records.    Lab Tests:  I ordered and personally interpreted labs.  The pertinent results include: CBC, CMP, troponin, UA, COVID, flu, RSV   Imaging Studies ordered:  I ordered imaging studies including CT head, CT C-spine, chest and pelvis I independently visualized and interpreted obtained imaging which showed NAD I agree with the radiologist interpretation.   Cardiac Monitoring:  The patient was maintained on a cardiac monitor.  I personally viewed and interpreted the cardiac monitor which showed an underlying rhythm of: NSR   Medicines ordered:  I ordered medication including IVF  for suspected dehydration  Reevaluation of the patient after these medicines showed that the patient: stayed the same  Problem List / ED Course:  Fall, failure to thrive, elevated troponin   Reevaluation:  After the interventions noted above, I reevaluated the patient and found that they have: stayed the same   Disposition:  After consideration of the diagnostic results and the patients response to treatment, I feel that  the patent would benefit from admission. '         Final Clinical Impression(s) / ED Diagnoses Final diagnoses:  Fall, initial encounter  Elevated troponin    Rx / DC Orders ED Discharge Orders     None         Valarie Merino, MD 02/26/22 1335

## 2022-02-26 NOTE — H&P (Signed)
History and Physical    Patient: [name] [name] UUV:[id number] DOB: 12/27/1928 DOA: 02/26/2022 DOS: the patient was seen and examined on 02/26/2022 PCP: Marijo Conception, FNP  Patient coming from: ALF/ILF  Chief Complaint:  Chief Complaint  Patient presents with   Fall   HPI: [name] [name] is a 89 y.o. male with medical history significant of anxiety, depression, asthma, BPH, systolic heart failure, chronic bronchitis CAD, type 2 diabetes, diverticulosis, GERD, hiatal hernia, hyperlipidemia, hypertension, lithiasis, small bowel obstruction, thrombocytopenia who is brought to the emergency department from his assisted living facility after having a fall at his assisted living facility.  He denied any trauma, LOC or prodromal symptoms.He denied fever, chills, rhinorrhea, sore throat, wheezing or hemoptysis.  No chest pain, palpitations, diaphoresis, PND, orthopnea or pitting edema of the lower extremities.  No abdominal pain, nausea, emesis, diarrhea, constipation, melena or hematochezia.  No flank pain, dysuria, frequency or hematuria.  No polyuria, polydipsia, polyphagia or blurred vision.   ED course: Initial vital signs were temperature 94.2 F, pulse 55, respiration 20, blood pressure 98/52 mmHg O2 sat 97% on room air.  Lab work: His CBC showed a white count of 5.9, hemoglobin 11.3 g/dL platelets 121.  Troponin was 299 then 554 ng/L.  Coronavirus, influenza and RSV PCR negative.  CMP showed a BUN of 28 mg/dL, total protein of 6.3 and albumin of 3.2 g/dL.  The rest of the CMP measurements were normal.  Imaging: Portable 1 view chest radiograph with unchanged mild cardiomegaly.  No focal airspace consolidation or pleural effusion or pneumothorax.  Pelvis x-ray with no evidence of acute fracture.  CT head without contrast with no acute intracranial abnormality.  CT cervical spine with no acute cervical spine fracture or traumatic listhesis.   Review of Systems: As mentioned in the history of  present illness. All other systems reviewed and are negative. Past Medical History:  Diagnosis Date   Anxiety    Asthma    "all my life"   BPH (benign prostatic hyperplasia)    "even now; after my OR" (12/28/2013)   CHF (congestive heart failure) (HCC)    Chronic bronchitis ([place])    "often"   Coronary artery disease    Depression    Diabetes mellitus without complication ([place])    Type II   Diverticulosis of colon (without mention of hemorrhage)    GERD 11/24/2006   Qualifier: Diagnosis of   By: Dance CMA (AAMA), Kim       Hiatal hernia    High cholesterol    Hypertension    Kidney stone    "I've had 7 over the years" (12/28/2013)   SBO (small bowel obstruction) ([place]) 04/07/2021   Thrombocytopenia ([place]) 04/08/2021   Past Surgical History:  Procedure Laterality Date   COLONOSCOPY W/ BIOPSIES AND POLYPECTOMY     CORONARY ANGIOPLASTY WITH STENT PLACEMENT  11/25/2013; 12/28/2013   "1; ?2"   CYSTOSCOPY W/ LITHOLAPAXY / EHL  X 2   INGUINAL HERNIA REPAIR Left 1962   INGUINAL HERNIA REPAIR Right 1980   LEFT HEART CATHETERIZATION WITH CORONARY ANGIOGRAM N/A 11/25/2013   Procedure: LEFT HEART CATHETERIZATION WITH CORONARY ANGIOGRAM;  Surgeon: Laverda Page, MD;  Location: [facility] CATH LAB;  Service: Cardiovascular;  Laterality: N/A;   PERCUTANEOUS CORONARY STENT INTERVENTION (PCI-S)  11/25/2013   Procedure: PERCUTANEOUS CORONARY STENT INTERVENTION (PCI-S);  Surgeon: Laverda Page, MD;  Location: [facility] CATH LAB;  Service: Cardiovascular;;   PERCUTANEOUS CORONARY STENT INTERVENTION (PCI-S) N/A 12/28/2013  Procedure: PERCUTANEOUS CORONARY STENT INTERVENTION (PCI-S);  Surgeon: Laverda Page, MD;  Location: [facility] CATH LAB;  Service: Cardiovascular;  Laterality: N/A;   PROSTATE SURGERY     TONSILLECTOMY  1937   TRANSURETHRAL RESECTION OF PROSTATE  ~ 2007   Social History:  reports that he has quit smoking. He has never used smokeless tobacco. He reports current alcohol use. He reports that he  does not use drugs.  Allergies  Allergen Reactions   Lipitor [Atorvastatin Calcium] Other (See Comments)    Irregular lab results--pt unsure of exact lab    Family History  Problem Relation Age of Onset   Heart disease Mother    Stroke Mother    Heart disease Father    Diabetes Brother     Prior to Admission medications   Medication Sig Start Date End Date Taking? Authorizing Provider  clopidogrel (PLAVIX) 75 MG tablet Take 1 tablet (75 mg total) by mouth daily with breakfast. 11/26/13   Adrian Prows, MD  docusate sodium (COLACE) 100 MG capsule Take 1 capsule (100 mg total) by mouth daily. 10/15/17   Caccavale, Sophia, PA-C  polyethylene glycol (MIRALAX / GLYCOLAX) 17 g packet Take 17 g by mouth 2 (two) times daily. 04/11/21   Shelly Coss, MD  tamsulosin (FLOMAX) 0.4 MG CAPS capsule Take 1 capsule (0.4 mg total) by mouth daily. 04/11/21   Shelly Coss, MD    Physical Exam: Vitals:   02/26/22 1245 02/26/22 1300 02/26/22 1330 02/26/22 1345  BP: (!) 85/56 105/79 (!) 92/54 (!) 93/57  Pulse: (!) 58 78 69 74  Resp: '11 16 15 14  '$ Temp:      TempSrc:      SpO2: 96% 95% 95% 96%  Weight:      Height:       Physical Exam Vitals and nursing note reviewed.  Constitutional:      General: He is awake. He is not in acute distress.    Appearance: He is cachectic. He is ill-appearing.  HENT:     Head: Normocephalic.     Nose: No rhinorrhea.     Mouth/Throat:     Mouth: Mucous membranes are moist.  Eyes:     General: No scleral icterus.    Pupils: Pupils are equal, round, and reactive to light.  Neck:     Vascular: No JVD.  Cardiovascular:     Rate and Rhythm: Normal rate and regular rhythm.     Heart sounds: S1 normal and S2 normal.  Pulmonary:     Effort: Pulmonary effort is normal.     Breath sounds: Normal breath sounds.  Abdominal:     General: Bowel sounds are normal.     Palpations: Abdomen is soft.     Tenderness: There is no abdominal tenderness.  Musculoskeletal:      Cervical back: Neck supple.     Right lower leg: No edema.     Left lower leg: No edema.  Skin:    General: Skin is warm and dry.  Neurological:     General: No focal deficit present.     Mental Status: He is alert and oriented to person, place, and time.  Psychiatric:        Mood and Affect: Mood normal.        Behavior: Behavior normal. Behavior is cooperative.     Data Reviewed:  Results are pending, will review when available.  Assessment and Plan: Principal Problem: History of:   Coronary artery disease Being served  for:   Elevated troponin level Observation/PCU. Supplemental oxygen as needed. No heparin per cardiology. Aspirin 325 mg p.o. x 1. Continue daily clopidogrel. BP low normal or mildly low No beta-blocker. Trend troponin level. Serial EKG. Obtain echocardiogram. Cardiology consult AM. Left message with the cardiology master.  Active Problems:   Chronic systolic CHF (congestive heart failure) (HCC) No signs of decompensation. Echocardiogram in the morning.    Type 2 diabetes mellitus (HCC) Carbohydrate modified diet. CBG monitoring before meals and bedtime.    Hyperlipidemia Not on statin. Declining initiating therapy.    Essential hypertension Blood pressure currently soft.    GERD Famotidine 20 mg p.o. daily. Avoid PPI since he is on clopidogrel.    BPH (benign prostatic hyperplasia) Continue tamsulosin daily.    [place] ([place]) Consult nutritional services. Protein supplementation.     Advance Care Planning:   Code Status: DNR   Consults:   Family Communication:   Severity of Illness: The appropriate patient status for this patient is OBSERVATION. Observation status is judged to be reasonable and necessary in order to provide the required intensity of service to ensure the patient's safety. The patient's presenting symptoms, physical exam findings, and initial radiographic and laboratory data in the context of their medical  condition is felt to place them at decreased risk for further clinical deterioration. Furthermore, it is anticipated that the patient will be medically stable for discharge from the hospital within 2 midnights of admission.   Author: Reubin Milan, MD 02/26/2022 3:14 PM  For on call review [email or web address].   This document was prepared using Dragon voice recognition software and may contain some unintended transcription errors.

## 2022-02-26 NOTE — ED Notes (Signed)
Pt refusing medications, pt states he needs to have a bowel movement, informed pt the benefits of stool softener and miriliax, pt refused.

## 2022-02-26 NOTE — ED Notes (Signed)
BP 86/52. Charge RN aware

## 2022-02-27 ENCOUNTER — Observation Stay (HOSPITAL_BASED_OUTPATIENT_CLINIC_OR_DEPARTMENT_OTHER): Payer: Medicare Other

## 2022-02-27 DIAGNOSIS — W19XXXA Unspecified fall, initial encounter: Secondary | ICD-10-CM

## 2022-02-27 DIAGNOSIS — I5022 Chronic systolic (congestive) heart failure: Secondary | ICD-10-CM | POA: Diagnosis not present

## 2022-02-27 DIAGNOSIS — R7989 Other specified abnormal findings of blood chemistry: Secondary | ICD-10-CM

## 2022-02-27 DIAGNOSIS — Y92009 Unspecified place in unspecified non-institutional (private) residence as the place of occurrence of the external cause: Secondary | ICD-10-CM | POA: Diagnosis not present

## 2022-02-27 LAB — ECHOCARDIOGRAM COMPLETE
AR max vel: 1.42 cm2
AV Area VTI: 1.16 cm2
AV Area mean vel: 1.23 cm2
AV Mean grad: 17.7 mmHg
AV Peak grad: 28.9 mmHg
AV Vena cont: 0.4 cm
Ao pk vel: 2.69 m/s
Area-P 1/2: 2.6 cm2
Calc EF: 21.8 %
Height: 70 in
MV M vel: 5.19 m/s
MV Peak grad: 107.7 mmHg
P 1/2 time: 422 msec
Radius: 0.4 cm
S' Lateral: 4.6 cm
Single Plane A2C EF: 24 %
Single Plane A4C EF: 24.9 %
Weight: 1763.68 oz

## 2022-02-27 LAB — URINALYSIS, ROUTINE W REFLEX MICROSCOPIC
Bilirubin Urine: NEGATIVE
Glucose, UA: NEGATIVE mg/dL
Hgb urine dipstick: NEGATIVE
Ketones, ur: NEGATIVE mg/dL
Leukocytes,Ua: NEGATIVE
Nitrite: NEGATIVE
Protein, ur: NEGATIVE mg/dL
Specific Gravity, Urine: 1.019 (ref 1.005–1.030)
pH: 6 (ref 5.0–8.0)

## 2022-02-27 LAB — CBG MONITORING, ED
Glucose-Capillary: 54 mg/dL — ABNORMAL LOW (ref 70–99)
Glucose-Capillary: 82 mg/dL (ref 70–99)
Glucose-Capillary: 97 mg/dL (ref 70–99)
Glucose-Capillary: 113 mg/dL — ABNORMAL HIGH (ref 70–99)
Glucose-Capillary: 119 mg/dL — ABNORMAL HIGH (ref 70–99)

## 2022-02-27 LAB — CBC
HCT: 30.7 % — ABNORMAL LOW (ref 39.0–52.0)
Hemoglobin: 10.2 g/dL — ABNORMAL LOW (ref 13.0–17.0)
MCH: 31.4 pg (ref 26.0–34.0)
MCHC: 33.2 g/dL (ref 30.0–36.0)
MCV: 94.5 fL (ref 80.0–100.0)
Platelets: 104 10*3/uL — ABNORMAL LOW (ref 150–400)
RBC: 3.25 MIL/uL — ABNORMAL LOW (ref 4.22–5.81)
RDW: 16.7 % — ABNORMAL HIGH (ref 11.5–15.5)
WBC: 3.2 10*3/uL — ABNORMAL LOW (ref 4.0–10.5)
nRBC: 0 % (ref 0.0–0.2)

## 2022-02-27 LAB — COMPREHENSIVE METABOLIC PANEL
ALT: 13 U/L (ref 0–44)
AST: 27 U/L (ref 15–41)
Albumin: 2.7 g/dL — ABNORMAL LOW (ref 3.5–5.0)
Alkaline Phosphatase: 39 U/L (ref 38–126)
Anion gap: 3 — ABNORMAL LOW (ref 5–15)
BUN: 28 mg/dL — ABNORMAL HIGH (ref 8–23)
CO2: 28 mmol/L (ref 22–32)
Calcium: 7.5 mg/dL — ABNORMAL LOW (ref 8.9–10.3)
Chloride: 106 mmol/L (ref 98–111)
Creatinine, Ser: 0.8 mg/dL (ref 0.61–1.24)
GFR, Estimated: >60 mL/min (ref >60)
Glucose, Bld: 66 mg/dL — ABNORMAL LOW (ref 70–99)
Potassium: 4 mmol/L (ref 3.5–5.1)
Sodium: 137 mmol/L (ref 135–145)
Total Bilirubin: 0.4 mg/dL (ref 0.3–1.2)
Total Protein: 5.4 g/dL — ABNORMAL LOW (ref 6.5–8.1)

## 2022-02-27 LAB — HEMOGLOBIN A1C
Hgb A1c MFr Bld: 5.1 % (ref 4.8–5.6)
Mean Plasma Glucose: 100 mg/dL

## 2022-02-27 NOTE — ED Notes (Signed)
Pt placed back on bair hugger due to rectal temp of 95.6, provider notified.

## 2022-02-27 NOTE — Progress Notes (Signed)
Echocardiogram 2D Echocardiogram has been performed.  [name] [name] 02/27/2022, 1:30 PM

## 2022-02-27 NOTE — Consult Note (Addendum)
Cardiology Consultation   Patient ID: [name] [name] MRN: [id number]; DOB: [birth year]  Admit date: 02/26/2022 Date of Consult: 02/27/2022  PCP:  Marijo Conception, [facility] Cardiologist:  Minus Breeding, MD     Patient Profile:   [name] [name] is a 89 y.o. male with a hx of CAD, systolic heart failure, aortic stenosis, chronic bronchitis, anxiety, depression, asthma, BPH, GERD, hiatal hernia, HLD, HTN who is being seen 02/27/2022 for the evaluation of elevated troponin at the request of Dr. Horris Latino.  History of Present Illness:   [name] [name] is a 89 year old male with above medical history. Patient previously followed by Dr. Einar Gip and Dr. Woody Seller with [facility], but later established care with [facility] in 03/2021. Per chart review, patient has a history of CAD.  Had DES to mid RCA in 11/2013.  Later had 2 overlapping DES to the mid-distal and proximal circumflex artery in 12/2013. EF at that time was 20%.  His cardiologist recommended lifelong Plavix. Patient was followed by cardiology until 2019, when he was lost to follow up.   Patient was admitted to the hospital in 03/2021 for treatment of recurrent left inguinal hernia and small bowel obstruction.  Cardiology asked to see for preoperative evaluation.  Echocardiogram on 04/09/2021 showed EF 25-30% with regional wall motion abnormalities, mild LVH, grade 2 diastolic dysfunction, normal RV systolic function, moderately dilated left atrium, dilation of the ascending aorta measuring 44 mm, moderate aortic valve stenosis.  The apex was not well-visualized, so patient underwent a limited echo with contrast on 02/07/22 that showed no LV thrombus.  Dr. Percival Spanish reviewed the echocardiogram, decided that it was likely patient had severe AS that was underestimated on previous studies.  However, patient had no symptoms and patient was not a candidate for advanced therapies.  Additionally, patient preferred  conservative therapies so he was not referred for TAVR evaluation.  Patient had not tolerated guideline directed medical therapy in the past, did not have acute cardiac complaints or evidence of volume overload that admission, so he was not started on new cardiac medications that admission.  Dr. Percival Spanish recommended palliative care consult.  He was not seen by cardiology in follow-up.  Patient was brought into the ED on 1/2 from his assisted living facility after having an unwitnessed fall.  Patient denied having any trauma, loss of consciousness, prodromal symptoms prior to his fall. Labs in ED showed Na 138, K 4.3, creatinine 0.81, WBC 5.9, hemoglobin 11.3, platelets 121. hsTn [phone]. CXR showed unchanged cardiomegaly, no focal airspace disease.   On interview, patient yesterday morning.  Reports that he "lost his balance for some reason." Denies loss of consciousness before or after fall.  Denies having chest pain, palpitations, shortness of breath prior to fall.  He was on the ground for a couple of hours before he was found.  Unsure exactly how long he was on the ground for.  While in the ED, patient reports that he is overall feeling OK.  Denies chest pain, palpitations, shortness of breath, dizziness.  He has felt somewhat constipated and is having difficulty urinating.  Patient reports that he would prefer to avoid any procedures and would rather have conservative management.  Past Medical History:  Diagnosis Date   Anxiety    Asthma    "all my life"   BPH (benign prostatic hyperplasia)    "even now; after my OR" (12/28/2013)   CHF (congestive heart failure) ([place])  Chronic bronchitis ([place])    "often"   Coronary artery disease    Depression    Diabetes mellitus without complication ([place])    Type II   Diverticulosis of colon (without mention of hemorrhage)    GERD 11/24/2006   Qualifier: Diagnosis of   By: Dance CMA (AAMA), Kim       Hiatal hernia    High cholesterol     Hypertension    Kidney stone    "I've had 7 over the years" (12/28/2013)   SBO (small bowel obstruction) ([place]) 04/07/2021   Thrombocytopenia ([place]) 04/08/2021    Past Surgical History:  Procedure Laterality Date   COLONOSCOPY W/ BIOPSIES AND POLYPECTOMY     CORONARY ANGIOPLASTY WITH STENT PLACEMENT  11/25/2013; 12/28/2013   "1; ?2"   CYSTOSCOPY W/ LITHOLAPAXY / EHL  X 2   INGUINAL HERNIA REPAIR Left 1962   INGUINAL HERNIA REPAIR Right 1980   LEFT HEART CATHETERIZATION WITH CORONARY ANGIOGRAM N/A 11/25/2013   Procedure: LEFT HEART CATHETERIZATION WITH CORONARY ANGIOGRAM;  Surgeon: Laverda Page, MD;  Location: [facility] CATH LAB;  Service: Cardiovascular;  Laterality: N/A;   PERCUTANEOUS CORONARY STENT INTERVENTION (PCI-S)  11/25/2013   Procedure: PERCUTANEOUS CORONARY STENT INTERVENTION (PCI-S);  Surgeon: Laverda Page, MD;  Location: [facility] CATH LAB;  Service: Cardiovascular;;   PERCUTANEOUS CORONARY STENT INTERVENTION (PCI-S) N/A 12/28/2013   Procedure: PERCUTANEOUS CORONARY STENT INTERVENTION (PCI-S);  Surgeon: Laverda Page, MD;  Location: [facility] CATH LAB;  Service: Cardiovascular;  Laterality: N/A;   PROSTATE SURGERY     TONSILLECTOMY  1937   TRANSURETHRAL RESECTION OF PROSTATE  ~ 2007     Home Medications:  Prior to Admission medications   Medication Sig Start Date End Date Taking? Authorizing Provider  clopidogrel (PLAVIX) 75 MG tablet Take 1 tablet (75 mg total) by mouth daily with breakfast. 11/26/13  Yes Adrian Prows, MD  docusate sodium (COLACE) 100 MG capsule Take 1 capsule (100 mg total) by mouth daily. 10/15/17  Yes Caccavale, Sophia, PA-C  tamsulosin (FLOMAX) 0.4 MG CAPS capsule Take 1 capsule (0.4 mg total) by mouth daily. 04/11/21  Yes Shelly Coss, MD  polyethylene glycol (MIRALAX / GLYCOLAX) 17 g packet Take 17 g by mouth 2 (two) times daily. Patient not taking: Reported on 02/26/2022 04/11/21   Shelly Coss, MD    Inpatient Medications: Scheduled Meds:  aspirin  324 mg  Oral NOW   Or   aspirin  300 mg Rectal NOW   aspirin EC  81 mg Oral Daily   clopidogrel  75 mg Oral Q breakfast   docusate sodium  100 mg Oral Daily   enoxaparin (LOVENOX) injection  40 mg Subcutaneous Q24H   feeding supplement  237 mL Oral BID BM   polyethylene glycol  17 g Oral BID   tamsulosin  0.4 mg Oral Daily   Continuous Infusions:  PRN Meds: acetaminophen **OR** acetaminophen, acetaminophen, nitroGLYCERIN, ondansetron (ZOFRAN) IV, ondansetron **OR** ondansetron (ZOFRAN) IV  Allergies:    Allergies  Allergen Reactions   Lipitor [Atorvastatin Calcium] Other (See Comments)    Irregular lab results--pt unsure of exact lab    Social History:   Social History   Socioeconomic History   Marital status: Single    Spouse name: Not on file   Number of children: Not on file   Years of education: Not on file   Highest education level: Not on file  Occupational History   Not on file  Tobacco Use   Smoking status:  Former   Smokeless tobacco: Never   Tobacco comments:    "quit smoking in my 20's"  Vaping Use   Vaping Use: Never used  Substance and Sexual Activity   Alcohol use: Yes    Comment: "stopped drinking in the 1970's"   Drug use: No   Sexual activity: Never  Other Topics Concern   Not on file  Social History Narrative   Not on file   Social Determinants of Health   Financial Resource Strain: Not on file  Food Insecurity: Not on file  Transportation Needs: Not on file  Physical Activity: Not on file  Stress: Not on file  Social Connections: Not on file  Intimate Partner Violence: Not on file    Family History:    Family History  Problem Relation Age of Onset   Heart disease Mother    Stroke Mother    Heart disease Father    Diabetes Brother      ROS:  Please see the history of present illness.   All other ROS reviewed and negative.     Physical Exam/Data:   Vitals:   02/27/22 0715 02/27/22 0730 02/27/22 0800 02/27/22 0830  BP: (!) 100/58  (!) 101/58 (!) 96/42 112/62  Pulse: (!) 52 (!) 57 (!) 52 62  Resp: '13 14 17 13  '$ Temp:      TempSrc:      SpO2: 100% 100% 100% 91%  Weight:      Height:        Intake/Output Summary (Last 24 hours) at 02/27/2022 0847 Last data filed at 02/27/2022 0448 Gross per 24 hour  Intake --  Output 1000 ml  Net -1000 ml      02/26/2022    9:56 AM 04/11/2021    5:00 AM 04/10/2021    5:37 AM  Last 3 Weights  Weight (lbs) 110 lb 3.7 oz 111 lb 15.9 oz 111 lb 8.8 oz  Weight (kg) 50 kg 50.8 kg 50.6 kg     Body mass index is 15.82 kg/m.  General: Thin, cachectic elderly male.  Sitting upright in the bed eating breakfast HEENT: normal Neck: no JVD Vascular: Radial pulses 2+ bilaterally Cardiac:  normal S1, S2; RRR; grade 2 out of 6 systolic murmur at right upper sternal border Lungs:  clear to auscultation bilaterally, no wheezing, rhonchi or rales.  Normal work of breathing on room air Ext: no edema Musculoskeletal:  No deformities, BUE and BLE strength normal and equal.  Bruising on bilateral upper extremities.  Patient tremulous  Skin: warm and dry  Neuro:  CNs 2-12 intact, no focal abnormalities noted Psych:  Normal affect   EKG:  The EKG was personally reviewed and demonstrates: EKG from 1/3 at 0514 showed sinus rhythm with left bundle branch block present.  Follow-up EKG on 1/28 at 0517 showing Mobitz type II second-degree heart block Telemetry:  Telemetry was personally reviewed and demonstrates: Predominantly normal sinus rhythm, occasional episodes of Mobitz 2 heart block.  Patient has significant artifact on telemetry due to his tremors/shakiness   Relevant CV Studies:  Echocardiogram pending  Laboratory Data:  High Sensitivity Troponin:   Recent Labs  Lab 02/26/22 1005 02/26/22 1232 02/26/22 1930  TROPONINIHS 299* 554* 682*     Chemistry Recent Labs  Lab 02/26/22 1005 02/27/22 0402  NA 138 137  K 4.3 4.0  CL 102 106  CO2 29 28  GLUCOSE 82 66*  BUN 28* 28*   CREATININE 0.81 0.80  CALCIUM 8.6* 7.5*  GFRNONAA >60 >60  ANIONGAP 7 3*    Recent Labs  Lab 02/26/22 1005 02/27/22 0402  PROT 6.3* 5.4*  ALBUMIN 3.2* 2.7*  AST 24 27  ALT 13 13  ALKPHOS 44 39  BILITOT 0.6 0.4   Lipids No results for input(s): "CHOL", "TRIG", "HDL", "LABVLDL", "LDLCALC", "CHOLHDL" in the last 168 hours.  Hematology Recent Labs  Lab 02/26/22 1005 02/27/22 0402  WBC 5.9 3.2*  RBC 3.62* 3.25*  HGB 11.3* 10.2*  HCT 34.2* 30.7*  MCV 94.5 94.5  MCH 31.2 31.4  MCHC 33.0 33.2  RDW 16.6* 16.7*  PLT 121* 104*   Thyroid No results for input(s): "TSH", "FREET4" in the last 168 hours.  BNPNo results for input(s): "BNP", "PROBNP" in the last 168 hours.  DDimer No results for input(s): "DDIMER" in the last 168 hours.   Radiology/Studies:  CT Head Wo Contrast  Result Date: 02/26/2022 CLINICAL DATA:  Unwitnessed fall. EXAM: CT HEAD WITHOUT CONTRAST CT CERVICAL SPINE WITHOUT CONTRAST TECHNIQUE: Multidetector CT imaging of the head and cervical spine was performed following the standard protocol without intravenous contrast. Multiplanar CT image reconstructions of the cervical spine were also generated. RADIATION DOSE REDUCTION: This exam was performed according to the departmental dose-optimization program which includes automated exposure control, adjustment of the mA and/or kV according to patient size and/or use of iterative reconstruction technique. COMPARISON:  CT head and cervical spine dated February 09, 2020. FINDINGS: CT HEAD FINDINGS Brain: No evidence of acute infarction, hemorrhage, hydrocephalus, extra-axial collection or mass lesion/mass effect. Vascular: Calcified atherosclerosis at the skull base. No hyperdense vessel. Skull: Normal. Negative for fracture or focal lesion. Sinuses/Orbits: No acute finding. Other: None. CT CERVICAL SPINE FINDINGS Alignment: No traumatic malalignment. Unchanged trace anterolisthesis at C7-T1. Skull base and vertebrae: No acute  fracture. No primary bone lesion or focal pathologic process. Soft tissues and spinal canal: No prevertebral fluid or swelling. No visible canal hematoma. Disc levels: Similar multilevel degenerative disc disease and advanced facet arthropathy. Upper chest: Negative. Other: None. IMPRESSION: 1. No acute intracranial abnormality. 2. No acute cervical spine fracture or traumatic listhesis. Electronically Signed   By: Titus Dubin M.D.   On: 02/26/2022 11:41   CT Cervical Spine Wo Contrast  Result Date: 02/26/2022 CLINICAL DATA:  Unwitnessed fall. EXAM: CT HEAD WITHOUT CONTRAST CT CERVICAL SPINE WITHOUT CONTRAST TECHNIQUE: Multidetector CT imaging of the head and cervical spine was performed following the standard protocol without intravenous contrast. Multiplanar CT image reconstructions of the cervical spine were also generated. RADIATION DOSE REDUCTION: This exam was performed according to the departmental dose-optimization program which includes automated exposure control, adjustment of the mA and/or kV according to patient size and/or use of iterative reconstruction technique. COMPARISON:  CT head and cervical spine dated February 09, 2020. FINDINGS: CT HEAD FINDINGS Brain: No evidence of acute infarction, hemorrhage, hydrocephalus, extra-axial collection or mass lesion/mass effect. Vascular: Calcified atherosclerosis at the skull base. No hyperdense vessel. Skull: Normal. Negative for fracture or focal lesion. Sinuses/Orbits: No acute finding. Other: None. CT CERVICAL SPINE FINDINGS Alignment: No traumatic malalignment. Unchanged trace anterolisthesis at C7-T1. Skull base and vertebrae: No acute fracture. No primary bone lesion or focal pathologic process. Soft tissues and spinal canal: No prevertebral fluid or swelling. No visible canal hematoma. Disc levels: Similar multilevel degenerative disc disease and advanced facet arthropathy. Upper chest: Negative. Other: None. IMPRESSION: 1. No acute intracranial  abnormality. 2. No acute cervical spine fracture or traumatic listhesis. Electronically Signed   By: Huntley Dec  Derry M.D.   On: 02/26/2022 11:41   DG Pelvis 1-2 Views  Result Date: 02/26/2022 CLINICAL DATA:  Fall EXAM: PELVIS - 1-2 VIEW COMPARISON:  None Available. FINDINGS: There is no evidence of acute fracture. Alignment is normal. There is mild-to-moderate osteoarthritis of the right and left hips. There are degenerative changes of the lower lumbar spine and SI joints. Osteopenia. IMPRESSION: No evidence of acute fracture on single frontal view of the pelvis. Electronically Signed   By: Maurine Simmering M.D.   On: 02/26/2022 11:38   DG Chest Port 1 View  Result Date: 02/26/2022 CLINICAL DATA:  Fall EXAM: PORTABLE CHEST 1 VIEW COMPARISON:  None Available. FINDINGS: Unchanged mild cardiomegaly. There is no focal airspace consolidation. There is no pleural effusion or evidence of pneumothorax. There is no acute osseous abnormality on frontal views of the chest. IMPRESSION: Unchanged cardiomegaly.  No focal airspace disease. Electronically Signed   By: Maurine Simmering M.D.   On: 02/26/2022 11:35     Assessment and Plan:   Elevated Troponin  CAD  - Patient has a history of CAD-- had DES to the mid RCA in 11/2013, later had 2 overlapping DES to mid-distal and proximal circumflex in 12/2013. After having his stents placed, patient was started on lifelong plavix therapy. - Now, patient presents after having an unwitnessed fall in his assisted living facility.  Reports that he was down for a couple of hours after the fall. Denies prodromal symptoms prior to fall, explains that he just lost his balance  - hsTn [phone]  - Patient denies chest pain, sob. Suspect troponins are elevated after patient had a fall and spent time down on the ground.  - Echo pending  - Patient prefers conservative management. No plans for cath at this time. I do not think he requires IV heparin given his lack of symptoms. MD to see    Chronic combined systolic and diastolic heart failure  - Most recent echocardiogram from 03/2021 showed EF 25-30%, grade II diastolic dysfunction  - Echo this admission pending - Patient euvolemic on exam - BP soft, no room to add guideline directed medical therapy at this time.   Aortic Stenosis  - Echo from 03/2021 showed moderate AS, however Dr. Percival Spanish reviewed that echo and suspected that AS was actually severe.  - Patient prefers conservative management.  Denies chest pain, shortness of breath, dizziness/near syncope.  He is euvolemic on exam -Echo this admission pending. However, I do not suspect we will aggressively manage AS   Bradycardia Second-degree heart block, Mobitz type II  PVCs  -Noted on telemetry, EKG -Heart rate in the 40s-70s -Denies syncope, near syncope -Patient reports that he would prefer conservative therapies and would like to avoid procedures.  As patient is fairly asymptomatic, I do not think that there is a need for pacemaker at this time, nor do I think patient would be agreeable -Continue to avoid AV nodal medications  Otherwise, per primary  - GERD  - BPH - Cachexia, possible failure to thrive  - Goals of Care--per chart, a nurse from [facility] came to talk to patient, and patient is agreeable to starting hospice care.    Risk Assessment/Risk Scores:   TIMI Risk Score for Unstable Angina or Non-ST Elevation MI:   The patient's TIMI risk score is 5, which indicates a 26% risk of all cause mortality, new or recurrent myocardial infarction or need for urgent revascularization in the next 14 days.{   New [place]  Heart Association (NYHA) Functional Class NYHA Class I    For questions or updates, please contact [place] Please consult [email or web address] for contact info under    Signed, Margie Billet, PA-C  02/27/2022 8:47 AM As above, patient seen and examined.  Briefly he is a 89 year old male with past medical  history of coronary artery disease, aortic stenosis, hypertension, hyperlipidemia, chronic bronchitis for evaluation of elevated troponin and bradycardia.  Echocardiogram February 2023 showed ejection fraction 25 to 30%, mild left ventricular hypertrophy, grade 2 diastolic dysfunction, apical akinesis, mild right ventricular enlargement, biatrial enlargement, dilated ascending aorta, moderate aortic stenosis, trace aortic insufficiency.  Patient resides in an assisted living facility.  He is a no CODE BLUE and is being transitioned to hospice care.  He was ambulating and fell.  He is unclear as to why he fell but denies syncope.  He was on the ground for 2 hours.  He was brought to the emergency room and troponins mildly elevated and cardiology asked to evaluate.  On physical exam he is very frail. Troponin 299, 554 and 682.  Hemoglobin 10.2.  Electrocardiogram shows sinus rhythm, left bundle branch block and Mobitz 2 second-degree AV block.  Review of telemetry shows sinus with intermittent Mobitz 1 second-degree AV block.  Patient admitted after fall.  He denies syncope.  His troponin is minimally elevated and he has noted to have predominant Mobitz 1 second-degree AV block on telemetry.  No prolonged pauses.  At present no indication for pacemaker.  Regardless patient is being transitioned to hospice care.  He is also adamant that he would never consider pacemaker or any aggressive procedures.  He tells me that "you cannot live forever".  We will not pursue further cardiac evaluation.  Avoid AV nodal blocking agents.  Would cancel echocardiogram as it would not change management.  We will sign off.  Please call with questions. Kirk Ruths, MD

## 2022-02-27 NOTE — Progress Notes (Signed)
PROGRESS NOTE  [name] [name] NLZ:[id number] DOB: 1929/02/10 DOA: 02/26/2022 PCP: Marijo Conception, FNP  HPI/Recap of past 24 hours: [name] [name] is a 89 y.o. male with medical history significant of anxiety, depression, asthma, BPH, systolic heart failure, CAD, type 2 diabetes, diverticulosis, GERD, hiatal hernia, hyperlipidemia, hypertension, small bowel obstruction, thrombocytopenia who is brought to the emergency department from his assisted living facility after having a fall at his assisted living facility.  He denied any LOC or prodromal symptoms. In the ED VS fairly stable. Labs significant for troponin was 299 then 554 ng/L. CXR unchanged mild cardiomegaly.  No focal airspace consolidation or pleural effusion or pneumothorax.  Pelvis x-ray with no evidence of acute fracture.  CT head without contrast with no acute intracranial abnormality.  CT cervical spine with no acute cervical spine fracture or traumatic listhesis.  Cardiology consulted, patient admitted for further management.    Today, patient denies any new complaints, met patient at bedside eating breakfast, able to feed himself.  Patient does not want any heroic measures done, currently DNR.  Outpatient hospice following.  PT/OT pending   Assessment/Plan: Principal Problem:   Elevated troponin level Active Problems:   Coronary artery disease   Type 2 diabetes mellitus (HCC)   Hyperlipidemia   Essential hypertension   GERD   BPH (benign prostatic hyperplasia)   Chronic systolic CHF (congestive heart failure) (HCC)   Cachexia (HCC)   Elevated troponin level Hx of CAD History of aortic stenosis History of bradycardia, second-degree heart block Elevated troponin likely 2/2 fall with prolonged downtime Cardiology consulted, no further recs, patient not open to any further interventions, pacemaker placement or heroic measures. Echocardiogram discontinued by cardiology, no further workup Continue aspirin,  clopidogrel  Likely mechanical fall Denies any LOC or prodromal symptoms prior to fall CT head/spine with no acute fractures PT/OT  Hypothermia Noted hypothermia, hypotension ??Infection, although denies any new complaints Chest x-ray unremarkable, urinalysis negative BC x 2 pending Monitor closely off antibiotics  Chronic systolic CHF (congestive heart failure) (HCC) No signs of decompensation, appears euvolemic Monitor closely   Type 2 diabetes mellitus ([place]) Appears well-controlled SSI, Accu-Cheks, hypoglycemic protocol   Hyperlipidemia Not on statin. Declining initiating therapy   Essential hypertension Blood pressure currently soft If persistent may gently hydrate Monitor closely   GERD Famotidine 20 mg p.o. daily. Avoid PPI since he is on clopidogrel.   BPH (benign prostatic hyperplasia) Continue tamsulosin daily   Cachexia ([place]) Consult nutritional services. Protein supplementation.     Estimated body mass index is 15.82 kg/m as calculated from the following:   Height as of this encounter: _0  (1.778 m).   Weight as of this encounter: 50 kg.     Code Status: DNR  Family Communication: None at bedside  Disposition Plan: Status is: Observation The patient remains OBS appropriate and will d/c before 2 midnights.      Consultants: Cardiology  Procedures: None  Antimicrobials: None  DVT prophylaxis: Lovenox   Objective: Vitals:   02/27/22 0930 02/27/22 1000 02/27/22 1034 02/27/22 1048  BP: 93/62 102/72 (!) 82/48   Pulse: 76 76 63   Resp: 14 (!) 23 11   Temp:    (S) 97.7 F (36.5 C)  TempSrc:    (S) Rectal  SpO2: 100% 96% 97%   Weight:      Height:        Intake/Output Summary (Last 24 hours) at 02/27/2022 1243 Last data filed at 02/27/2022 0448 Gross per  24 hour  Intake --  Output 1000 ml  Net -1000 ml   Filed Weights   02/26/22 0956  Weight: 50 kg    Exam: General: NAD, frail, cachectic, elderly Cardiovascular:  S1, S2 present Respiratory: CTAB Abdomen: Soft, nontender, nondistended, bowel sounds present Musculoskeletal: No bilateral pedal edema noted Skin: Normal Psychiatry: Normal mood    Data Reviewed: CBC: Recent Labs  Lab 02/26/22 1005 02/27/22 0402  WBC 5.9 3.2*  NEUTROABS 5.4  --   HGB 11.3* 10.2*  HCT 34.2* 30.7*  MCV 94.5 94.5  PLT 121* 007*   Basic Metabolic Panel: Recent Labs  Lab 02/26/22 1005 02/27/22 0402  NA 138 137  K 4.3 4.0  CL 102 106  CO2 29 28  GLUCOSE 82 66*  BUN 28* 28*  CREATININE 0.81 0.80  CALCIUM 8.6* 7.5*   GFR: Estimated Creatinine Clearance: 42.5 mL/min (by C-G formula based on SCr of 0.8 mg/dL). Liver Function Tests: Recent Labs  Lab 02/26/22 1005 02/27/22 0402  AST 24 27  ALT 13 13  ALKPHOS 44 39  BILITOT 0.6 0.4  PROT 6.3* 5.4*  ALBUMIN 3.2* 2.7*   No results for input(s): "LIPASE", "AMYLASE" in the last 168 hours. No results for input(s): "AMMONIA" in the last 168 hours. Coagulation Profile: No results for input(s): "INR", "PROTIME" in the last 168 hours. Cardiac Enzymes: No results for input(s): "CKTOTAL", "CKMB", "CKMBINDEX", "TROPONINI" in the last 168 hours. BNP (last 3 results) No results for input(s): "PROBNP" in the last 8760 hours. HbA1C: No results for input(s): "HGBA1C" in the last 72 hours. CBG: Recent Labs  Lab 02/26/22 2147 02/27/22 0801 02/27/22 0842  GLUCAP 86 54* 82   Lipid Profile: No results for input(s): "CHOL", "HDL", "LDLCALC", "TRIG", "CHOLHDL", "LDLDIRECT" in the last 72 hours. Thyroid Function Tests: No results for input(s): "TSH", "T4TOTAL", "FREET4", "T3FREE", "THYROIDAB" in the last 72 hours. Anemia Panel: No results for input(s): "VITAMINB12", "FOLATE", "FERRITIN", "TIBC", "IRON", "RETICCTPCT" in the last 72 hours. Urine analysis:    Component Value Date/Time   COLORURINE YELLOW 02/27/2022 0100   APPEARANCEUR CLEAR 02/27/2022 0100   LABSPEC 1.019 02/27/2022 0100   PHURINE 6.0  02/27/2022 0100   GLUCOSEU NEGATIVE 02/27/2022 0100   GLUCOSEU NEGATIVE 10/13/2006 1709   HGBUR NEGATIVE 02/27/2022 0100   BILIRUBINUR NEGATIVE 02/27/2022 0100   BILIRUBINUR negative 10/11/2016 1809   KETONESUR NEGATIVE 02/27/2022 0100   PROTEINUR NEGATIVE 02/27/2022 0100   UROBILINOGEN 0.2 10/11/2016 1809   UROBILINOGEN 1.0 03/09/2007 1122   NITRITE NEGATIVE 02/27/2022 0100   LEUKOCYTESUR NEGATIVE 02/27/2022 0100   Sepsis Labs: _0 (procalcitonin:4,lacticidven:4)  ) Recent Results (from the past 240 hour(s))  Resp panel by RT-PCR (RSV, Flu A&B, Covid) Anterior Nasal Swab     Status: None   Collection Time: 02/26/22  9:56 AM   Specimen: Anterior Nasal Swab  Result Value Ref Range Status   SARS Coronavirus 2 by RT PCR NEGATIVE NEGATIVE Final    Comment: (NOTE) SARS-CoV-2 target nucleic acids are NOT DETECTED.  The SARS-CoV-2 RNA is generally detectable in upper respiratory specimens during the acute phase of infection. The lowest concentration of SARS-CoV-2 viral copies this assay can detect is 138 copies/mL. A negative result does not preclude SARS-Cov-2 infection and should not be used as the sole basis for treatment or other patient management decisions. A negative result may occur with  improper specimen collection/handling, submission of specimen other than nasopharyngeal swab, presence of viral mutation(s) within the areas targeted by this assay, and inadequate  number of viral copies(<138 copies/mL). A negative result must be combined with clinical observations, patient history, and epidemiological information. The expected result is Negative.  Fact Sheet for Patients:  EntrepreneurPulse.com.au  Fact Sheet for Healthcare Providers:  IncredibleEmployment.be  This test is no t yet approved or cleared by the [place] FDA and  has been authorized for detection and/or diagnosis of SARS-CoV-2 by FDA under an Emergency Use  Authorization (EUA). This EUA will remain  in effect (meaning this test can be used) for the duration of the COVID-19 declaration under Section 564(b)(1) of the Act, 21 U.S.C.section 360bbb-3(b)(1), unless the authorization is terminated  or revoked sooner.       Influenza A by PCR NEGATIVE NEGATIVE Final   Influenza B by PCR NEGATIVE NEGATIVE Final    Comment: (NOTE) The Xpert Xpress SARS-CoV-2/FLU/RSV plus assay is intended as an aid in the diagnosis of influenza from Nasopharyngeal swab specimens and should not be used as a sole basis for treatment. Nasal washings and aspirates are unacceptable for Xpert Xpress SARS-CoV-2/FLU/RSV testing.  Fact Sheet for Patients: EntrepreneurPulse.com.au  Fact Sheet for Healthcare Providers: IncredibleEmployment.be  This test is not yet approved or cleared by the [place] FDA and has been authorized for detection and/or diagnosis of SARS-CoV-2 by FDA under an Emergency Use Authorization (EUA). This EUA will remain in effect (meaning this test can be used) for the duration of the COVID-19 declaration under Section 564(b)(1) of the Act, 21 U.S.C. section 360bbb-3(b)(1), unless the authorization is terminated or revoked.     Resp Syncytial Virus by PCR NEGATIVE NEGATIVE Final    Comment: (NOTE) Fact Sheet for Patients: EntrepreneurPulse.com.au  Fact Sheet for Healthcare Providers: IncredibleEmployment.be  This test is not yet approved or cleared by the [place] FDA and has been authorized for detection and/or diagnosis of SARS-CoV-2 by FDA under an Emergency Use Authorization (EUA). This EUA will remain in effect (meaning this test can be used) for the duration of the COVID-19 declaration under Section 564(b)(1) of the Act, 21 U.S.C. section 360bbb-3(b)(1), unless the authorization is terminated or revoked.  Performed at [facility],  [place] [address], [place], [place] [address]       Studies: No results found.  Scheduled Meds:  aspirin  324 mg Oral NOW   Or   aspirin  300 mg Rectal NOW   aspirin EC  81 mg Oral Daily   clopidogrel  75 mg Oral Q breakfast   docusate sodium  100 mg Oral Daily   enoxaparin (LOVENOX) injection  40 mg Subcutaneous Q24H   feeding supplement  237 mL Oral BID BM   polyethylene glycol  17 g Oral BID   tamsulosin  0.4 mg Oral Daily    Continuous Infusions:   LOS: 0 days     Alma Friendly, MD [facility]  If 7PM-7AM, please contact night-coverage [email or web address] 02/27/2022, 12:43 PM

## 2022-02-28 DIAGNOSIS — W19XXXA Unspecified fall, initial encounter: Secondary | ICD-10-CM | POA: Diagnosis not present

## 2022-02-28 DIAGNOSIS — R7989 Other specified abnormal findings of blood chemistry: Secondary | ICD-10-CM | POA: Diagnosis not present

## 2022-02-28 DIAGNOSIS — Z7401 Bed confinement status: Secondary | ICD-10-CM | POA: Diagnosis not present

## 2022-02-28 DIAGNOSIS — R531 Weakness: Secondary | ICD-10-CM | POA: Diagnosis not present

## 2022-02-28 DIAGNOSIS — I5022 Chronic systolic (congestive) heart failure: Secondary | ICD-10-CM

## 2022-02-28 DIAGNOSIS — Y92009 Unspecified place in unspecified non-institutional (private) residence as the place of occurrence of the external cause: Secondary | ICD-10-CM | POA: Diagnosis not present

## 2022-02-28 LAB — BASIC METABOLIC PANEL
Anion gap: 3 — ABNORMAL LOW (ref 5–15)
BUN: 31 mg/dL — ABNORMAL HIGH (ref 8–23)
CO2: 29 mmol/L (ref 22–32)
Calcium: 7.9 mg/dL — ABNORMAL LOW (ref 8.9–10.3)
Chloride: 103 mmol/L (ref 98–111)
Creatinine, Ser: 0.89 mg/dL (ref 0.61–1.24)
GFR, Estimated: >60 mL/min (ref >60)
Glucose, Bld: 91 mg/dL (ref 70–99)
Potassium: 4 mmol/L (ref 3.5–5.1)
Sodium: 135 mmol/L (ref 135–145)

## 2022-02-28 LAB — CBG MONITORING, ED
Glucose-Capillary: 105 mg/dL — ABNORMAL HIGH (ref 70–99)
Glucose-Capillary: 121 mg/dL — ABNORMAL HIGH (ref 70–99)

## 2022-02-28 LAB — CBC WITH DIFFERENTIAL/PLATELET
Abs Immature Granulocytes: 0.01 10*3/uL (ref 0.00–0.07)
Basophils Absolute: 0 10*3/uL (ref 0.0–0.1)
Basophils Relative: 0 %
Eosinophils Absolute: 0 10*3/uL (ref 0.0–0.5)
Eosinophils Relative: 1 %
HCT: 31.7 % — ABNORMAL LOW (ref 39.0–52.0)
Hemoglobin: 10.4 g/dL — ABNORMAL LOW (ref 13.0–17.0)
Immature Granulocytes: 0 %
Lymphocytes Relative: 26 %
Lymphs Abs: 0.8 10*3/uL (ref 0.7–4.0)
MCH: 30.9 pg (ref 26.0–34.0)
MCHC: 32.8 g/dL (ref 30.0–36.0)
MCV: 94.1 fL (ref 80.0–100.0)
Monocytes Absolute: 0.2 10*3/uL (ref 0.1–1.0)
Monocytes Relative: 7 %
Neutro Abs: 2 10*3/uL (ref 1.7–7.7)
Neutrophils Relative %: 66 %
Platelets: 108 10*3/uL — ABNORMAL LOW (ref 150–400)
RBC: 3.37 MIL/uL — ABNORMAL LOW (ref 4.22–5.81)
RDW: 16.9 % — ABNORMAL HIGH (ref 11.5–15.5)
WBC: 3.1 10*3/uL — ABNORMAL LOW (ref 4.0–10.5)
nRBC: 0 % (ref 0.0–0.2)

## 2022-02-28 LAB — LIPOPROTEIN A (LPA): Lipoprotein (a): 26.5 nmol/L (ref <75.0)

## 2022-02-28 MED ORDER — ENSURE ENLIVE PO LIQD: 237.0000 mL | Freq: Two times a day (BID) | ORAL | 12 refills | Status: AC

## 2022-02-28 NOTE — Evaluation (Signed)
Physical Therapy Evaluation Patient Details Name: [name] [name] MRN: [id number] DOB: 12-18-1928 Today's Date: 02/28/2022  History of Present Illness  [name] [name] is a 89 y.o. male with medical history significant of anxiety, depression, asthma, BPH, systolic heart failure, CAD, type 2 diabetes, diverticulosis, GERD, hiatal hernia, hyperlipidemia, hypertension, small bowel obstruction, thrombocytopenia who is brought to the emergency department 02/26/22 from his assisted living facility after having a fall and was down  indeterminent time . CTnegative for fractures  Clinical Impression  Pt admitted with above diagnosis.  Pt currently with functional limitations due to the deficits listed below (see PT Problem List). Pt will benefit from skilled PT to increase their independence and safety with mobility to allow discharge to the venue listed below.   The  patient is A/O x 4. Per patient , modified independent at the ALF, ambulates with a cane to meals, performs ADL's.  Patient  did  mobilize and stand at the bed edge and took a few steps using RW.  Patient reports painful at tail bone, offered to reposition.  Patient may require increased assistance when returns to ALF. May benefit from short term rehab stay,  versus HHPT , depending on progress and assistance  available at  ALF        Recommendations for follow up therapy are one component of a multi-disciplinary discharge planning process, led by the attending physician.  Recommendations may be updated based on patient status, additional functional criteria and insurance authorization.  Follow Up Recommendations Skilled nursing-short term rehab (<3 hours/day) Can patient physically be transported by private vehicle: No    Assistance Recommended at Discharge Frequent or constant Supervision/Assistance  Patient can return home with the following  A lot of help with walking and/or transfers;A little help with bathing/dressing/bathroom     Equipment Recommendations  (TBD)  Recommendations for Other Services       Functional Status Assessment Patient has had a recent decline in their functional status and demonstrates the ability to make significant improvements in function in a reasonable and predictable amount of time.     Precautions / Restrictions Precautions Precautions: Fall Precaution Comments: cachectic, thin skin Restrictions Weight Bearing Restrictions: No      Mobility  Bed Mobility Overal bed mobility: Needs Assistance Bed Mobility: Supine to Sit, Sit to Supine     Supine to sit: Min assist, HOB elevated Sit to supine: Min assist   General bed mobility comments: assist with trunk  and legs to sit up and back to supine, guarding  so not hitting thin skin anywhere    Transfers Overall transfer level: Needs assistance Equipment used: Rolling walker (2 wheels) Transfers: Sit to/from Stand Sit to Stand: Min assist           General transfer comment: stands at Rw, able to side steps along the bed x 4 steps.    Ambulation/Gait                  Stairs            Wheelchair Mobility    Modified Rankin (Stroke Patients Only)       Balance Overall balance assessment: History of Falls, Needs assistance Sitting-balance support: Feet supported, Bilateral upper extremity supported Sitting balance-Leahy Scale: Fair     Standing balance support: Bilateral upper extremity supported, During functional activity, Reliant on assistive device for balance Standing balance-Leahy Scale: Poor  Pertinent Vitals/Pain Pain Assessment Pain Assessment: Faces Faces Pain Scale: Hurts even more Pain Location: tail bone and  right ASIS abrasion(placed mepilex) Pain Descriptors / Indicators: Burning, Grimacing, Moaning, Guarding Pain Intervention(s): Limited activity within patient's tolerance, Repositioned, Monitored during session    Home Living  Family/patient expects to be discharged to:: Assisted living                 Home Equipment: Cane - quad Additional Comments: states his cane has 6 prongs    Prior Function Prior Level of Function : Independent/Modified Independent             Mobility Comments: ambulates with a cane, goes to 3 meals per day. ADLs Comments: Mod I for ADLs; assist with med management     Hand Dominance        Extremity/Trunk Assessment   Upper Extremity Assessment Upper Extremity Assessment: Generalized weakness    Lower Extremity Assessment Lower Extremity Assessment: Generalized weakness    Cervical / Trunk Assessment Cervical / Trunk Assessment: Kyphotic  Communication   Communication: No difficulties  Cognition Arousal/Alertness: Awake/alert Behavior During Therapy: WFL for tasks assessed/performed Overall Cognitive Status: Within Functional Limits for tasks assessed                                          General Comments      Exercises     Assessment/Plan    PT Assessment Patient needs continued PT services  PT Problem List Decreased strength;Decreased activity tolerance;Decreased mobility;Decreased balance;Pain;Decreased skin integrity       PT Treatment Interventions DME instruction;Therapeutic activities;Gait training;Functional mobility training;Therapeutic exercise;Patient/family education;Balance training    PT Goals (Current goals can be found in the Care Plan section)  Acute Rehab PT Goals Patient Stated Goal: agreed to getting up PT Goal Formulation: With patient Time For Goal Achievement: 03/14/22 Potential to Achieve Goals: Good    Frequency Min 2X/week     Co-evaluation               AM-PAC PT "6 Clicks" Mobility  Outcome Measure Help needed turning from your back to your side while in a flat bed without using bedrails?: A Little Help needed moving from lying on your back to sitting on the side of a flat bed without  using bedrails?: A Little Help needed moving to and from a bed to a chair (including a wheelchair)?: A Little Help needed standing up from a chair using your arms (e.g., wheelchair or bedside chair)?: A Lot Help needed to walk in hospital room?: Total Help needed climbing 3-5 steps with a railing? : Total 6 Click Score: 13    End of Session Equipment Utilized During Treatment: Gait belt Activity Tolerance: Patient limited by fatigue;Patient tolerated treatment well Patient left: in bed;with call bell/phone within reach Nurse Communication: Mobility status PT Visit Diagnosis: Unsteadiness on feet (R26.81);History of falling (Z91.81)    Time: 6063-0160 PT Time Calculation (min) (ACUTE ONLY): 25 min   Charges:   PT Evaluation $PT Eval Low Complexity: 1 Low PT Treatments $Therapeutic Activity: 8-22 mins        [place] Office [phone] Weekend [phone]   Claretha Cooper 02/28/2022, 12:16 PM

## 2022-02-28 NOTE — Progress Notes (Signed)
[name] [name] reported that [name] has a pending referral for this pt.

## 2022-02-28 NOTE — ED Notes (Signed)
Pt refused rectal temp at this time

## 2022-02-28 NOTE — Discharge Summary (Addendum)
Physician Discharge Summary   Patient: [name] [name] MRN: [id number] DOB: [birth year]  Admit date:     02/26/2022  Discharge date: 02/28/22  Discharge Physician: Alma Friendly   PCP: Marijo Conception, FNP   Recommendations at discharge:    Follow up with home hospice services   Discharge Diagnoses: Principal Problem:   Elevated troponin level Active Problems:   Coronary artery disease   Type 2 diabetes mellitus (HCC)   Hyperlipidemia   Essential hypertension   GERD   BPH (benign prostatic hyperplasia)   Chronic systolic CHF (congestive heart failure) ([place])   Cachexia [facility])   Hospital Course: [name] [name] is a 89 y.o. male with medical history significant of anxiety, depression, asthma, BPH, systolic heart failure, CAD, type 2 diabetes, diverticulosis, GERD, hiatal hernia, hyperlipidemia, hypertension, small bowel obstruction, thrombocytopenia who is brought to the emergency department from his assisted living facility after having a fall at his assisted living facility.  He denied any LOC or prodromal symptoms. In the ED VS fairly stable. Labs significant for troponin was 299 then 554 ng/L. CXR unchanged mild cardiomegaly.  No focal airspace consolidation or pleural effusion or pneumothorax.  Pelvis x-ray with no evidence of acute fracture.  CT head without contrast with no acute intracranial abnormality.  CT cervical spine with no acute cervical spine fracture or traumatic listhesis.  Cardiology consulted, patient admitted for further management.     Today, patient denies any new complaints. Agreeable to discharge with home hospice. Adamant about not having any procedure/interventions.   Assessment and Plan:  Elevated troponin level Hx of CAD History of aortic stenosis History of bradycardia, second-degree heart block Elevated troponin likely 2/2 fall with prolonged downtime Cardiology consulted, no further recs, patient not open to any further interventions,  pacemaker placement or heroic measures. Echocardiogram discontinued by cardiology, although was already done EF 20-25%, regional wall motion abnormalities Continue clopidogrel Adamant about not having any procedure/interventions, home hospice   Likely mechanical fall Denies any LOC or prodromal symptoms prior to fall CT head/spine with no acute fractures PT/OT   Hypothermia Noted hypothermia, hypotension, advanced systolic HF may be contributing ??Infection, although denies any new complaints Chest x-ray unremarkable, urinalysis negative BC x 2 NGTD Monitor closely off antibiotics   Chronic systolic CHF (congestive heart failure) (HCC) Advanced systolic HF with ECHO as mentioned above ?cardiogenic shock Home hospice   Type 2 diabetes mellitus ([place]) Appears well-controlled   Hyperlipidemia Not on statin. Declining initiating therapy   Essential hypertension Blood pressure currently soft   GERD Famotidine 20 mg p.o. daily. Avoid PPI since he is on clopidogrel.   BPH (benign prostatic hyperplasia) Acute urinary retention (in and out cath was noted to be unsuccessful) Continue tamsulosin daily Foley placed on 02/26/22, continue and attempt voiding trial once able    Cachexia [facility]) Consult nutritional services. Protein supplementation.  [place] discussion Pt with extensive medical hx, very poor prognosis, advanced age Hospice team evaluated, patient agreeable to switch to home hospice DNR   A nurse, Rise Paganini, from [facility] came to talk to pt and he was agreeable to being placed in hospice care with her. [facility] is the preferred hospice service for [place], the pts current care service.    Contact information for [place], [place] Office: [phone] Cell: [phone]  Fax: [phone] Email: [email or web address]     Consultants: Cardiology Procedures performed: None Disposition: Assisted living Diet recommendation:  Regular  diet    DISCHARGE MEDICATION: Allergies as of  02/28/2022       Reactions   Lipitor [atorvastatin Calcium] Other (See Comments)   Irregular lab results--pt unsure of exact lab        Medication List     TAKE these medications    clopidogrel 75 MG tablet Commonly known as: PLAVIX Take 1 tablet (75 mg total) by mouth daily with breakfast.   docusate sodium 100 MG capsule Commonly known as: COLACE Take 1 capsule (100 mg total) by mouth daily.   feeding supplement Liqd Take 237 mLs by mouth 2 (two) times daily between meals.   polyethylene glycol 17 g packet Commonly known as: MIRALAX / GLYCOLAX Take 17 g by mouth 2 (two) times daily.   tamsulosin 0.4 MG Caps capsule Commonly known as: Flomax Take 1 capsule (0.4 mg total) by mouth daily.        Discharge Exam: Filed Weights   02/26/22 0956  Weight: 50 kg   General: NAD, very frail Cardiovascular: S1, S2 present Respiratory: CTAB Abdomen: Soft, nontender, nondistended, bowel sounds present Musculoskeletal: No bilateral pedal edema noted Skin: Normal Psychiatry: Normal mood   Condition at discharge: fair  The results of significant diagnostics from this hospitalization (including imaging, microbiology, ancillary and laboratory) are listed below for reference.   Imaging Studies: ECHOCARDIOGRAM COMPLETE  Result Date: 02/27/2022    ECHOCARDIOGRAM REPORT   Patient Name:   [name] [name] Date of Exam: 02/27/2022 Medical Rec #:  [id number]      Height:       70.0 in Accession #:    [id number]     Weight:       110.2 lb Date of Birth:  July 31, 1928      BSA:          1.620 m Patient Age:    53 years       BP:           85/45 mmHg Patient Gender: M              HR:           62 bpm. Exam Location:  Inpatient Procedure: 2D Echo, Cardiac Doppler and Color Doppler Indications:    Elevated Troponin  History:        Patient has prior history of Echocardiogram examinations, most                 recent 04/10/2021. CHF, CAD; Risk  Factors:Hypertension, GERD and                 Diabetes.  Sonographer:    Bernadene Person RDCS Referring Phys: [id number] [place]  Sonographer Comments: pt refused definity. IMPRESSIONS  1. Left ventricular ejection fraction, by estimation, is 20-25%. The left ventricle has severely decreased function. The left ventricle demonstrates regional wall motion abnormalities (global hypokinesis with akinesis of the apex, no LV thrombus noted).  There is moderate asymmetric left ventricular hypertrophy of the basal-septal segment. Left ventricular diastolic parameters are consistent with Grade I diastolic dysfunction (impaired relaxation).  2. Right ventricular systolic function is normal. The right ventricular size is normal. There is normal pulmonary artery systolic pressure.  3. Left atrial size was moderately dilated.  4. Moderate pericardial effusion. The pericardial effusion is anterior to the right ventricle. Respirophasic variation in tricuspid valve inflow tract velocities but with no other signs of increased pericardia pressure.  5. The mitral valve is degenerative. Mild mitral valve regurgitation. No evidence of mitral stenosis.  6. 2D Valve area 1.29  cm2. There is diminished LVEF and severely reduced DVI. Mixed valve disease. The aortic valve is calcified. There is moderate calcification of the aortic valve. Aortic valve regurgitation is moderate. At least moderate low flow low  gradient aortic valve stenosis. Aortic regurgitation PHT measures 422 msec. Aortic valve mean gradient measures 17.7 mmHg.  7. Aortic dilatation noted. There is mild dilatation of the aortic root, measuring 41 mm. Comparison(s): Increased aortic valve gradient despite severely depressed LVEF. FINDINGS  Left Ventricle: Left ventricular ejection fraction, by estimation, is 20 to 25%. The left ventricle has severely decreased function. The left ventricle demonstrates regional wall motion abnormalities. The left ventricular internal  cavity size was normal  in size. There is moderate asymmetric left ventricular hypertrophy of the basal-septal segment. Left ventricular diastolic parameters are consistent with Grade I diastolic dysfunction (impaired relaxation).  LV Wall Scoring: The apical lateral segment, apical septal segment, apical anterior segment, and apical inferior segment are akinetic. Right Ventricle: The right ventricular size is normal. No increase in right ventricular wall thickness. Right ventricular systolic function is normal. There is normal pulmonary artery systolic pressure. The tricuspid regurgitant velocity is 1.86 m/s, and  with an assumed right atrial pressure of 3 mmHg, the estimated right ventricular systolic pressure is 77.8 mmHg. Left Atrium: Left atrial size was moderately dilated. Right Atrium: Right atrial size was normal in size. Pericardium: A moderately sized pericardial effusion is present. The pericardial effusion is anterior to the right ventricle. There is excessive respiratory variation in the tricuspid valve spectral Doppler velocities. Mitral Valve: The mitral valve is degenerative in appearance. Mild mitral valve regurgitation. No evidence of mitral valve stenosis. Tricuspid Valve: The tricuspid valve is grossly normal. Tricuspid valve regurgitation is not demonstrated. Aortic Valve: 2D Valve area 1.29 cm2. There is diminished LVEF and severely reduced DVI. Mixed valve disease. The aortic valve is calcified. There is moderate calcification of the aortic valve. There is mild aortic valve annular calcification. Aortic valve regurgitation is moderate. Aortic regurgitation PHT measures 422 msec. Moderate to severe aortic stenosis is present. Aortic valve mean gradient measures 17.7 mmHg. Aortic valve peak gradient measures 28.9 mmHg. Aortic valve area, by VTI measures 1.16 cm. Pulmonic Valve: The pulmonic valve was normal in structure. Pulmonic valve regurgitation is trivial. No evidence of pulmonic stenosis.  Aorta: Aortic dilatation noted. There is mild dilatation of the aortic root, measuring 41 mm. IAS/Shunts: No atrial level shunt detected by color flow Doppler.  LEFT VENTRICLE PLAX 2D LVIDd:         5.40 cm      Diastology LVIDs:         4.60 cm      LV e' medial:    4.12 cm/s LV PW:         1.00 cm      LV E/e' medial:  12.6 LV IVS:        1.00 cm      LV e' lateral:   3.78 cm/s LVOT diam:     2.50 cm      LV E/e' lateral: 13.7 LV SV:         76 LV SV Index:   47 LVOT Area:     4.91 cm  LV Volumes (MOD) LV vol d, MOD A2C: 233.0 ml LV vol d, MOD A4C: 225.0 ml LV vol s, MOD A2C: 177.0 ml LV vol s, MOD A4C: 169.0 ml LV SV MOD A2C:     56.0 ml LV SV MOD A4C:  225.0 ml LV SV MOD BP:      50.9 ml RIGHT VENTRICLE RV S prime:     10.90 cm/s TAPSE (M-mode): 2.3 cm LEFT ATRIUM             Index        RIGHT ATRIUM           Index LA diam:        3.70 cm 2.28 cm/m   RA Area:     16.70 cm LA Vol (A2C):   66.0 ml 40.73 ml/m  RA Volume:   37.60 ml  23.20 ml/m LA Vol (A4C):   64.6 ml 39.87 ml/m LA Biplane Vol: 67.9 ml 41.90 ml/m  AORTIC VALVE AV Area (Vmax):    1.42 cm AV Area (Vmean):   1.23 cm AV Area (VTI):     1.16 cm AV Vmax:           268.67 cm/s AV Vmean:          200.000 cm/s AV VTI:            0.651 m AV Peak Grad:      28.9 mmHg AV Mean Grad:      17.7 mmHg LVOT Vmax:         77.50 cm/s LVOT Vmean:        50.000 cm/s LVOT VTI:          0.154 m LVOT/AV VTI ratio: 0.24 AI PHT:            422 msec AR Vena Contracta: 0.40 cm  AORTA Ao Root diam: 4.10 cm Ao Asc diam:  3.90 cm MITRAL VALVE                  TRICUSPID VALVE MV Area (PHT): 2.60 cm       TR Peak grad:   13.8 mmHg MV Decel Time: 292 msec       TR Vmax:        186.00 cm/s MR Peak grad:    107.7 mmHg MR Mean grad:    54.0 mmHg    SHUNTS MR Vmax:         519.00 cm/s  Systemic VTI:  0.15 m MR Vmean:        334.0 cm/s   Systemic Diam: 2.50 cm MR PISA:         1.01 cm MR PISA Eff ROA: 7 mm MR PISA Radius:  0.40 cm MV E velocity: 51.80 cm/s MV A velocity:  82.60 cm/s MV E/A ratio:  0.63 Rudean Haskell MD Electronically signed by Rudean Haskell MD Signature Date/Time: 02/27/2022/1:49:01 PM    Final    CT Head Wo Contrast  Result Date: 02/26/2022 CLINICAL DATA:  Unwitnessed fall. EXAM: CT HEAD WITHOUT CONTRAST CT CERVICAL SPINE WITHOUT CONTRAST TECHNIQUE: Multidetector CT imaging of the head and cervical spine was performed following the standard protocol without intravenous contrast. Multiplanar CT image reconstructions of the cervical spine were also generated. RADIATION DOSE REDUCTION: This exam was performed according to the departmental dose-optimization program which includes automated exposure control, adjustment of the mA and/or kV according to patient size and/or use of iterative reconstruction technique. COMPARISON:  CT head and cervical spine dated February 09, 2020. FINDINGS: CT HEAD FINDINGS Brain: No evidence of acute infarction, hemorrhage, hydrocephalus, extra-axial collection or mass lesion/mass effect. Vascular: Calcified atherosclerosis at the skull base. No hyperdense vessel. Skull: Normal. Negative for fracture or focal lesion. Sinuses/Orbits: No acute finding. Other: None. CT  CERVICAL SPINE FINDINGS Alignment: No traumatic malalignment. Unchanged trace anterolisthesis at C7-T1. Skull base and vertebrae: No acute fracture. No primary bone lesion or focal pathologic process. Soft tissues and spinal canal: No prevertebral fluid or swelling. No visible canal hematoma. Disc levels: Similar multilevel degenerative disc disease and advanced facet arthropathy. Upper chest: Negative. Other: None. IMPRESSION: 1. No acute intracranial abnormality. 2. No acute cervical spine fracture or traumatic listhesis. Electronically Signed   By: Titus Dubin M.D.   On: 02/26/2022 11:41   CT Cervical Spine Wo Contrast  Result Date: 02/26/2022 CLINICAL DATA:  Unwitnessed fall. EXAM: CT HEAD WITHOUT CONTRAST CT CERVICAL SPINE WITHOUT CONTRAST TECHNIQUE:  Multidetector CT imaging of the head and cervical spine was performed following the standard protocol without intravenous contrast. Multiplanar CT image reconstructions of the cervical spine were also generated. RADIATION DOSE REDUCTION: This exam was performed according to the departmental dose-optimization program which includes automated exposure control, adjustment of the mA and/or kV according to patient size and/or use of iterative reconstruction technique. COMPARISON:  CT head and cervical spine dated February 09, 2020. FINDINGS: CT HEAD FINDINGS Brain: No evidence of acute infarction, hemorrhage, hydrocephalus, extra-axial collection or mass lesion/mass effect. Vascular: Calcified atherosclerosis at the skull base. No hyperdense vessel. Skull: Normal. Negative for fracture or focal lesion. Sinuses/Orbits: No acute finding. Other: None. CT CERVICAL SPINE FINDINGS Alignment: No traumatic malalignment. Unchanged trace anterolisthesis at C7-T1. Skull base and vertebrae: No acute fracture. No primary bone lesion or focal pathologic process. Soft tissues and spinal canal: No prevertebral fluid or swelling. No visible canal hematoma. Disc levels: Similar multilevel degenerative disc disease and advanced facet arthropathy. Upper chest: Negative. Other: None. IMPRESSION: 1. No acute intracranial abnormality. 2. No acute cervical spine fracture or traumatic listhesis. Electronically Signed   By: Titus Dubin M.D.   On: 02/26/2022 11:41   DG Pelvis 1-2 Views  Result Date: 02/26/2022 CLINICAL DATA:  Fall EXAM: PELVIS - 1-2 VIEW COMPARISON:  None Available. FINDINGS: There is no evidence of acute fracture. Alignment is normal. There is mild-to-moderate osteoarthritis of the right and left hips. There are degenerative changes of the lower lumbar spine and SI joints. Osteopenia. IMPRESSION: No evidence of acute fracture on single frontal view of the pelvis. Electronically Signed   By: Maurine Simmering M.D.   On:  02/26/2022 11:38   DG Chest Port 1 View  Result Date: 02/26/2022 CLINICAL DATA:  Fall EXAM: PORTABLE CHEST 1 VIEW COMPARISON:  None Available. FINDINGS: Unchanged mild cardiomegaly. There is no focal airspace consolidation. There is no pleural effusion or evidence of pneumothorax. There is no acute osseous abnormality on frontal views of the chest. IMPRESSION: Unchanged cardiomegaly.  No focal airspace disease. Electronically Signed   By: Maurine Simmering M.D.   On: 02/26/2022 11:35    Microbiology: Results for orders placed or performed during the hospital encounter of 02/26/22  Resp panel by RT-PCR (RSV, Flu A&B, Covid) Anterior Nasal Swab     Status: None   Collection Time: 02/26/22  9:56 AM   Specimen: Anterior Nasal Swab  Result Value Ref Range Status   SARS Coronavirus 2 by RT PCR NEGATIVE NEGATIVE Final    Comment: (NOTE) SARS-CoV-2 target nucleic acids are NOT DETECTED.  The SARS-CoV-2 RNA is generally detectable in upper respiratory specimens during the acute phase of infection. The lowest concentration of SARS-CoV-2 viral copies this assay can detect is 138 copies/mL. A negative result does not preclude SARS-Cov-2 infection and should not be used  as the sole basis for treatment or other patient management decisions. A negative result may occur with  improper specimen collection/handling, submission of specimen other than nasopharyngeal swab, presence of viral mutation(s) within the areas targeted by this assay, and inadequate number of viral copies(<138 copies/mL). A negative result must be combined with clinical observations, patient history, and epidemiological information. The expected result is Negative.  Fact Sheet for Patients:  EntrepreneurPulse.com.au  Fact Sheet for Healthcare Providers:  IncredibleEmployment.be  This test is no t yet approved or cleared by the [place] FDA and  has been authorized for detection and/or diagnosis  of SARS-CoV-2 by FDA under an Emergency Use Authorization (EUA). This EUA will remain  in effect (meaning this test can be used) for the duration of the COVID-19 declaration under Section 564(b)(1) of the Act, 21 U.S.C.section 360bbb-3(b)(1), unless the authorization is terminated  or revoked sooner.       Influenza A by PCR NEGATIVE NEGATIVE Final   Influenza B by PCR NEGATIVE NEGATIVE Final    Comment: (NOTE) The Xpert Xpress SARS-CoV-2/FLU/RSV plus assay is intended as an aid in the diagnosis of influenza from Nasopharyngeal swab specimens and should not be used as a sole basis for treatment. Nasal washings and aspirates are unacceptable for Xpert Xpress SARS-CoV-2/FLU/RSV testing.  Fact Sheet for Patients: EntrepreneurPulse.com.au  Fact Sheet for Healthcare Providers: IncredibleEmployment.be  This test is not yet approved or cleared by the [place] FDA and has been authorized for detection and/or diagnosis of SARS-CoV-2 by FDA under an Emergency Use Authorization (EUA). This EUA will remain in effect (meaning this test can be used) for the duration of the COVID-19 declaration under Section 564(b)(1) of the Act, 21 U.S.C. section 360bbb-3(b)(1), unless the authorization is terminated or revoked.     Resp Syncytial Virus by PCR NEGATIVE NEGATIVE Final    Comment: (NOTE) Fact Sheet for Patients: EntrepreneurPulse.com.au  Fact Sheet for Healthcare Providers: IncredibleEmployment.be  This test is not yet approved or cleared by the [place] FDA and has been authorized for detection and/or diagnosis of SARS-CoV-2 by FDA under an Emergency Use Authorization (EUA). This EUA will remain in effect (meaning this test can be used) for the duration of the COVID-19 declaration under Section 564(b)(1) of the Act, 21 U.S.C. section 360bbb-3(b)(1), unless the authorization is terminated  or revoked.  Performed at [facility], [place] [address] [place], [place] [address]   Culture, blood (Routine X 2) w Reflex to ID Panel     Status: None (Preliminary result)   Collection Time: 02/27/22  3:54 PM   Specimen: BLOOD  Result Value Ref Range Status   Specimen Description   Final    BLOOD RIGHT ANTECUBITAL Performed at [facility] [address] [place], [place] [address]    Special Requests   Final    BOTTLES DRAWN AEROBIC AND ANAEROBIC Blood Culture adequate volume Performed at [facility] [address], [place], [place] [address]    Culture   Final    NO GROWTH < 12 HOURS Performed at [facility] [address] [place], [place] [address]    Report Status PENDING  Incomplete  Culture, blood (Routine X 2) w Reflex to ID Panel     Status: None (Preliminary result)   Collection Time: 02/27/22  4:00 PM   Specimen: BLOOD RIGHT ARM  Result Value Ref Range Status   Specimen Description   Final    BLOOD RIGHT ARM  Performed at [facility], [place] [address] [place], [place] [address]    Special Requests   Final    BOTTLES DRAWN AEROBIC AND ANAEROBIC Blood Culture results may not be optimal due to an inadequate volume of blood received in culture bottles Performed at [facility] [address] [place], [place] [address]    Culture   Final    NO GROWTH < 12 HOURS Performed at [facility] [address] [place], [place] [address]    Report Status PENDING  Incomplete    Labs: CBC: Recent Labs  Lab 02/26/22 1005 02/27/22 0402 02/28/22 0223  WBC 5.9 3.2* 3.1*  NEUTROABS 5.4  --  2.0  HGB 11.3* 10.2* 10.4*  HCT 34.2* 30.7* 31.7*  MCV 94.5 94.5 94.1  PLT 121* 104* 356*   Basic Metabolic Panel: Recent Labs  Lab 02/26/22 1005 02/27/22 0402 02/28/22 0223  NA 138 137 135  K 4.3 4.0 4.0  CL 102 106 103  CO2 '29 28 29  '$ GLUCOSE 82 66* 91  BUN 28* 28* 31*   CREATININE 0.81 0.80 0.89  CALCIUM 8.6* 7.5* 7.9*   Liver Function Tests: Recent Labs  Lab 02/26/22 1005 02/27/22 0402  AST 24 27  ALT 13 13  ALKPHOS 44 39  BILITOT 0.6 0.4  PROT 6.3* 5.4*  ALBUMIN 3.2* 2.7*   CBG: Recent Labs  Lab 02/27/22 1443 02/27/22 1703 02/27/22 2114 02/28/22 1137 02/28/22 1750  GLUCAP 97 113* 119* 105* 121*    Discharge time spent: less than 30 minutes.  Signed: Alma Friendly, MD [facility] 02/28/2022

## 2022-02-28 NOTE — ED Notes (Signed)
[facility] called for transport 

## 2022-03-01 DIAGNOSIS — I5022 Chronic systolic (congestive) heart failure: Secondary | ICD-10-CM | POA: Diagnosis not present

## 2022-03-01 DIAGNOSIS — Z66 Do not resuscitate: Secondary | ICD-10-CM | POA: Diagnosis not present

## 2022-03-01 DIAGNOSIS — Z515 Encounter for palliative care: Secondary | ICD-10-CM | POA: Diagnosis not present

## 2022-03-01 DIAGNOSIS — I2510 Atherosclerotic heart disease of native coronary artery without angina pectoris: Secondary | ICD-10-CM | POA: Diagnosis not present

## 2022-03-01 DIAGNOSIS — E46 Unspecified protein-calorie malnutrition: Secondary | ICD-10-CM | POA: Diagnosis not present

## 2022-03-01 DIAGNOSIS — N400 Enlarged prostate without lower urinary tract symptoms: Secondary | ICD-10-CM | POA: Diagnosis not present

## 2022-03-01 DIAGNOSIS — E118 Type 2 diabetes mellitus with unspecified complications: Secondary | ICD-10-CM | POA: Diagnosis not present

## 2022-03-01 DIAGNOSIS — K219 Gastro-esophageal reflux disease without esophagitis: Secondary | ICD-10-CM | POA: Diagnosis not present

## 2022-03-01 NOTE — NC FL2 (Signed)
[place] LEVEL OF CARE FORM     IDENTIFICATION  Patient Name: [name] [name] Birthdate: 1928-06-16 Sex: male Admission Date (Current Location): 02/26/2022  [facility] and [place] Number:  Herbalist and Address:  [facility],  [place], [place]      Provider Number: [id number]  Attending Physician Name and Address:  No att. providers found  Relative Name and Phone Number:       Current Level of Care: Hospital Recommended Level of Care: [place] Prior Approval Number:    Date Approved/Denied:   PASRR Number:    Discharge Plan: Other (Comment) (assisted living)    Current Diagnoses: Patient Active Problem List   Diagnosis Date Noted   Elevated troponin level 02/26/2022   Cachexia ([place]) 02/26/2022   Palliative care encounter 14/78/2956   Chronic systolic CHF (congestive heart failure) ([place]) 04/10/2021   Aortic stenosis 04/10/2021   Sinus bradycardia 04/10/2021   Left inguinal hernia 04/08/2021   Dehydration 04/08/2021   Acute prerenal azotemia 04/08/2021   Thrombocytopenia ([place]) 04/08/2021   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   Acute urinary retention 04/08/2021   BPH (benign prostatic hyperplasia)    SBO (small bowel obstruction) ([place]) 04/07/2021   S/P PTCA (percutaneous transluminal coronary angioplasty) 12/28/2013   Coronary artery disease 12/26/2013   Postsurgical percutaneous transluminal coronary angioplasty (PTCA) status 11/25/2013   ACUTE ATOPIC CONJUNCTIVITIS 01/08/2007   ACUTE BRONCHITIS 01/08/2007   INSOMNIA-SLEEP DISORDER-UNSPEC 12/19/2006   ANXIETY 12/10/2006   DEPRESSION 12/10/2006   TINNITUS, CHRONIC 12/10/2006   ALLERGIC RHINITIS 12/10/2006   BENIGN PROSTATIC HYPERTROPHY 12/10/2006   WEIGHT LOSS 12/10/2006   Glucose intolerance 12/10/2006   NEPHROLITHIASIS, HX OF 12/10/2006   RHINITIS, ALLERGIC NEC 12/09/2006   Unspecified asthma(493.90) 12/09/2006   COLONIC POLYPS 11/24/2006   Type  2 diabetes mellitus ([place]) 11/24/2006   Hyperlipidemia 11/24/2006   Essential hypertension 11/24/2006   GERD 11/24/2006   Diverticulosis of colon (without mention of hemorrhage) 11/24/2006   CALCULUS, KIDNEY 11/24/2006   BENIGN PROSTATIC HYPERTROPHY, HX OF 11/24/2006    Orientation RESPIRATION BLADDER Height & Weight     Self, Time, Place  Normal Indwelling catheter Weight: 50 kg Height:  '5\' 10"'$  (177.8 cm)  BEHAVIORAL SYMPTOMS/MOOD NEUROLOGICAL BOWEL NUTRITION STATUS      Continent Diet  AMBULATORY STATUS COMMUNICATION OF NEEDS Skin   Supervision Verbally Normal                       Personal Care Assistance Level of Assistance  Bathing, Feeding, Dressing   Feeding assistance: Independent Dressing Assistance: Limited assistance     Functional Limitations Info             SPECIAL CARE FACTORS FREQUENCY                       Contractures Contractures Info: Not present    Additional Factors Info  Code Status, Allergies Code Status Info: Full Allergies Info: lipitor               Discharge Medications: TAKE these medications     clopidogrel 75 MG tablet Commonly known as: PLAVIX Take 1 tablet (75 mg total) by mouth daily with breakfast.    docusate sodium 100 MG capsule Commonly known as: COLACE Take 1 capsule (100 mg total) by mouth daily.    feeding supplement Liqd Take 237 mLs by mouth 2 (two) times daily between meals.  polyethylene glycol 17 g packet Commonly known as: MIRALAX / GLYCOLAX Take 17 g by mouth 2 (two) times daily.    tamsulosin 0.4 MG Caps capsule Commonly known as: Flomax Take 1 capsule (0.4 mg total) by mouth daily.       Relevant Imaging Results:  Relevant Lab Results:   Additional Information SSN: [id number]  Joaquin Courts, RN

## 2022-03-02 DIAGNOSIS — I5022 Chronic systolic (congestive) heart failure: Secondary | ICD-10-CM | POA: Diagnosis not present

## 2022-03-02 DIAGNOSIS — N400 Enlarged prostate without lower urinary tract symptoms: Secondary | ICD-10-CM | POA: Diagnosis not present

## 2022-03-02 DIAGNOSIS — E118 Type 2 diabetes mellitus with unspecified complications: Secondary | ICD-10-CM | POA: Diagnosis not present

## 2022-03-02 DIAGNOSIS — E46 Unspecified protein-calorie malnutrition: Secondary | ICD-10-CM | POA: Diagnosis not present

## 2022-03-02 DIAGNOSIS — I2510 Atherosclerotic heart disease of native coronary artery without angina pectoris: Secondary | ICD-10-CM | POA: Diagnosis not present

## 2022-03-02 DIAGNOSIS — K219 Gastro-esophageal reflux disease without esophagitis: Secondary | ICD-10-CM | POA: Diagnosis not present

## 2022-03-04 DIAGNOSIS — N400 Enlarged prostate without lower urinary tract symptoms: Secondary | ICD-10-CM | POA: Diagnosis not present

## 2022-03-04 DIAGNOSIS — E118 Type 2 diabetes mellitus with unspecified complications: Secondary | ICD-10-CM | POA: Diagnosis not present

## 2022-03-04 DIAGNOSIS — E46 Unspecified protein-calorie malnutrition: Secondary | ICD-10-CM | POA: Diagnosis not present

## 2022-03-04 DIAGNOSIS — I5022 Chronic systolic (congestive) heart failure: Secondary | ICD-10-CM | POA: Diagnosis not present

## 2022-03-04 DIAGNOSIS — K219 Gastro-esophageal reflux disease without esophagitis: Secondary | ICD-10-CM | POA: Diagnosis not present

## 2022-03-04 DIAGNOSIS — I2510 Atherosclerotic heart disease of native coronary artery without angina pectoris: Secondary | ICD-10-CM | POA: Diagnosis not present

## 2022-03-04 LAB — CULTURE, BLOOD (ROUTINE X 2)
Culture: NO GROWTH
Culture: NO GROWTH
Special Requests: ADEQUATE

## 2022-03-06 DIAGNOSIS — E782 Mixed hyperlipidemia: Secondary | ICD-10-CM | POA: Diagnosis not present

## 2022-03-06 DIAGNOSIS — E119 Type 2 diabetes mellitus without complications: Secondary | ICD-10-CM | POA: Diagnosis not present

## 2022-03-06 DIAGNOSIS — D518 Other vitamin B12 deficiency anemias: Secondary | ICD-10-CM | POA: Diagnosis not present

## 2022-03-06 DIAGNOSIS — E038 Other specified hypothyroidism: Secondary | ICD-10-CM | POA: Diagnosis not present

## 2022-03-06 DIAGNOSIS — Z79899 Other long term (current) drug therapy: Secondary | ICD-10-CM | POA: Diagnosis not present

## 2022-03-09 DIAGNOSIS — I2510 Atherosclerotic heart disease of native coronary artery without angina pectoris: Secondary | ICD-10-CM | POA: Diagnosis not present

## 2022-03-09 DIAGNOSIS — I5022 Chronic systolic (congestive) heart failure: Secondary | ICD-10-CM | POA: Diagnosis not present

## 2022-03-09 DIAGNOSIS — E46 Unspecified protein-calorie malnutrition: Secondary | ICD-10-CM | POA: Diagnosis not present

## 2022-03-09 DIAGNOSIS — E118 Type 2 diabetes mellitus with unspecified complications: Secondary | ICD-10-CM | POA: Diagnosis not present

## 2022-03-09 DIAGNOSIS — N400 Enlarged prostate without lower urinary tract symptoms: Secondary | ICD-10-CM | POA: Diagnosis not present

## 2022-03-09 DIAGNOSIS — K219 Gastro-esophageal reflux disease without esophagitis: Secondary | ICD-10-CM | POA: Diagnosis not present

## 2022-03-11 DIAGNOSIS — N400 Enlarged prostate without lower urinary tract symptoms: Secondary | ICD-10-CM | POA: Diagnosis not present

## 2022-03-11 DIAGNOSIS — K219 Gastro-esophageal reflux disease without esophagitis: Secondary | ICD-10-CM | POA: Diagnosis not present

## 2022-03-11 DIAGNOSIS — I2510 Atherosclerotic heart disease of native coronary artery without angina pectoris: Secondary | ICD-10-CM | POA: Diagnosis not present

## 2022-03-11 DIAGNOSIS — E46 Unspecified protein-calorie malnutrition: Secondary | ICD-10-CM | POA: Diagnosis not present

## 2022-03-11 DIAGNOSIS — I5022 Chronic systolic (congestive) heart failure: Secondary | ICD-10-CM | POA: Diagnosis not present

## 2022-03-11 DIAGNOSIS — E118 Type 2 diabetes mellitus with unspecified complications: Secondary | ICD-10-CM | POA: Diagnosis not present

## 2022-03-13 DIAGNOSIS — E46 Unspecified protein-calorie malnutrition: Secondary | ICD-10-CM | POA: Diagnosis not present

## 2022-03-13 DIAGNOSIS — I5022 Chronic systolic (congestive) heart failure: Secondary | ICD-10-CM | POA: Diagnosis not present

## 2022-03-13 DIAGNOSIS — I2510 Atherosclerotic heart disease of native coronary artery without angina pectoris: Secondary | ICD-10-CM | POA: Diagnosis not present

## 2022-03-13 DIAGNOSIS — K219 Gastro-esophageal reflux disease without esophagitis: Secondary | ICD-10-CM | POA: Diagnosis not present

## 2022-03-13 DIAGNOSIS — E118 Type 2 diabetes mellitus with unspecified complications: Secondary | ICD-10-CM | POA: Diagnosis not present

## 2022-03-13 DIAGNOSIS — N400 Enlarged prostate without lower urinary tract symptoms: Secondary | ICD-10-CM | POA: Diagnosis not present

## 2022-03-16 DIAGNOSIS — K219 Gastro-esophageal reflux disease without esophagitis: Secondary | ICD-10-CM | POA: Diagnosis not present

## 2022-03-16 DIAGNOSIS — I5022 Chronic systolic (congestive) heart failure: Secondary | ICD-10-CM | POA: Diagnosis not present

## 2022-03-16 DIAGNOSIS — E118 Type 2 diabetes mellitus with unspecified complications: Secondary | ICD-10-CM | POA: Diagnosis not present

## 2022-03-16 DIAGNOSIS — N400 Enlarged prostate without lower urinary tract symptoms: Secondary | ICD-10-CM | POA: Diagnosis not present

## 2022-03-16 DIAGNOSIS — E46 Unspecified protein-calorie malnutrition: Secondary | ICD-10-CM | POA: Diagnosis not present

## 2022-03-16 DIAGNOSIS — I2510 Atherosclerotic heart disease of native coronary artery without angina pectoris: Secondary | ICD-10-CM | POA: Diagnosis not present

## 2022-03-19 ENCOUNTER — Other Ambulatory Visit: Payer: Self-pay

## 2022-03-19 ENCOUNTER — Emergency Department (HOSPITAL_COMMUNITY)
Admission: EM | Admit: 2022-03-19 | Discharge: 2022-03-19 | Disposition: A | Discharge status: Home / Self Care | Payer: Medicare Other | Attending: Emergency Medicine | Admitting: Emergency Medicine

## 2022-03-19 DIAGNOSIS — Z466 Encounter for fitting and adjustment of urinary device: Secondary | ICD-10-CM | POA: Diagnosis not present

## 2022-03-19 DIAGNOSIS — R531 Weakness: Secondary | ICD-10-CM | POA: Diagnosis not present

## 2022-03-19 DIAGNOSIS — I10 Essential (primary) hypertension: Secondary | ICD-10-CM | POA: Diagnosis not present

## 2022-03-19 DIAGNOSIS — E118 Type 2 diabetes mellitus with unspecified complications: Secondary | ICD-10-CM | POA: Diagnosis not present

## 2022-03-19 DIAGNOSIS — Z7401 Bed confinement status: Secondary | ICD-10-CM | POA: Diagnosis not present

## 2022-03-19 DIAGNOSIS — I5022 Chronic systolic (congestive) heart failure: Secondary | ICD-10-CM | POA: Diagnosis not present

## 2022-03-19 DIAGNOSIS — E46 Unspecified protein-calorie malnutrition: Secondary | ICD-10-CM | POA: Diagnosis not present

## 2022-03-19 DIAGNOSIS — R58 Hemorrhage, not elsewhere classified: Secondary | ICD-10-CM | POA: Diagnosis not present

## 2022-03-19 DIAGNOSIS — Z7901 Long term (current) use of anticoagulants: Secondary | ICD-10-CM | POA: Diagnosis not present

## 2022-03-19 DIAGNOSIS — N400 Enlarged prostate without lower urinary tract symptoms: Secondary | ICD-10-CM | POA: Diagnosis not present

## 2022-03-19 DIAGNOSIS — K219 Gastro-esophageal reflux disease without esophagitis: Secondary | ICD-10-CM | POA: Diagnosis not present

## 2022-03-19 DIAGNOSIS — I959 Hypotension, unspecified: Secondary | ICD-10-CM | POA: Diagnosis not present

## 2022-03-19 DIAGNOSIS — I2510 Atherosclerotic heart disease of native coronary artery without angina pectoris: Secondary | ICD-10-CM | POA: Insufficient documentation

## 2022-03-19 NOTE — ED Triage Notes (Signed)
Pt arrives from [facility] after pulling ot foley tonight. Pt has bleeding from urethra. Per EMS pt under hospice care.

## 2022-03-19 NOTE — ED Provider Notes (Signed)
[place] EMERGENCY DEPARTMENT AT [facility] Provider Note   CSN: [id number] Arrival date & time: 03/19/22  0141     History  Chief Complaint  Patient presents with   Removed Catheter    [name] [name] is a 89 y.o. male.  The history is provided by the patient and medical records.   89 year old male with history of anxiety, BPH with indwelling Foley, coronary artery disease, depression, hypertension, GERD, hyperlipidemia, presenting to the ED from SNF after accidentally pulling out his Foley catheter.  Facility reported some bleeding at the urethra.  He is currently under hospice care.  Patient is sleeping and has no acute complaints presently.  Home Medications Prior to Admission medications   Medication Sig Start Date End Date Taking? Authorizing Provider  clopidogrel (PLAVIX) 75 MG tablet Take 1 tablet (75 mg total) by mouth daily with breakfast. 11/26/13   Adrian Prows, MD  docusate sodium (COLACE) 100 MG capsule Take 1 capsule (100 mg total) by mouth daily. 10/15/17   Caccavale, Sophia, PA-C  feeding supplement (ENSURE ENLIVE / ENSURE PLUS) LIQD Take 237 mLs by mouth 2 (two) times daily between meals. 02/28/22   Alma Friendly, MD  polyethylene glycol (MIRALAX / GLYCOLAX) 17 g packet Take 17 g by mouth 2 (two) times daily. Patient not taking: Reported on 02/26/2022 04/11/21   Shelly Coss, MD  tamsulosin (FLOMAX) 0.4 MG CAPS capsule Take 1 capsule (0.4 mg total) by mouth daily. 04/11/21   Shelly Coss, MD      Allergies    Lipitor [atorvastatin calcium]    Review of Systems   Review of Systems  Genitourinary:        Pulled foley out  All other systems reviewed and are negative.   Physical Exam Updated Vital Signs BP 93/75   Pulse 74   Resp 16   SpO2 99%   Physical Exam Vitals and nursing note reviewed.  Constitutional:      Appearance: He is well-developed.     Comments: Elderly, sleeping, NAD  HENT:     Head: Normocephalic and atraumatic.   Eyes:     Conjunctiva/sclera: Conjunctivae normal.     Pupils: Pupils are equal, round, and reactive to light.  Cardiovascular:     Rate and Rhythm: Normal rate and regular rhythm.     Heart sounds: Normal heart sounds.  Pulmonary:     Effort: Pulmonary effort is normal.     Breath sounds: Normal breath sounds.  Abdominal:     General: Bowel sounds are normal.     Palpations: Abdomen is soft.  Musculoskeletal:        General: Normal range of motion.     Cervical back: Normal range of motion.  Skin:    General: Skin is warm and dry.  Neurological:     Mental Status: He is alert and oriented to person, place, and time.     ED Results / Procedures / Treatments   Labs (all labs ordered are listed, but only abnormal results are displayed) Labs Reviewed - No data to display  EKG None  Radiology No results found.  Procedures Procedures    Medications Ordered in ED Medications - No data to display  ED Course/ Medical Decision Making/ A&P                             Medical Decision Making  89 year old male transported to ED tonight for replacement  of his Foley catheter.  This was apparently dislodged at his nursing facility.  Patient is sleeping and has no acute complaints.  He is hemodynamically stable.  Foley catheter was replaced without issue, draining clear/yellow urine.  Appears stable for discharge back to facility.  [facility] called to arrange transport.  Final Clinical Impression(s) / ED Diagnoses Final diagnoses:  Encounter for Foley catheter replacement    Rx / DC Orders ED Discharge Orders     None         Larene Pickett, PA-C 03/19/22 Calvin, April, MD 03/19/22 [phone]

## 2022-03-19 NOTE — Discharge Instructions (Signed)
New foley catheter placed today. Follow-up with primary care. Return here for new concerns.

## 2022-03-20 DIAGNOSIS — I5022 Chronic systolic (congestive) heart failure: Secondary | ICD-10-CM | POA: Diagnosis not present

## 2022-03-20 DIAGNOSIS — N400 Enlarged prostate without lower urinary tract symptoms: Secondary | ICD-10-CM | POA: Diagnosis not present

## 2022-03-20 DIAGNOSIS — E118 Type 2 diabetes mellitus with unspecified complications: Secondary | ICD-10-CM | POA: Diagnosis not present

## 2022-03-20 DIAGNOSIS — I2510 Atherosclerotic heart disease of native coronary artery without angina pectoris: Secondary | ICD-10-CM | POA: Diagnosis not present

## 2022-03-20 DIAGNOSIS — K219 Gastro-esophageal reflux disease without esophagitis: Secondary | ICD-10-CM | POA: Diagnosis not present

## 2022-03-20 DIAGNOSIS — E46 Unspecified protein-calorie malnutrition: Secondary | ICD-10-CM | POA: Diagnosis not present

## 2022-03-21 DIAGNOSIS — I2510 Atherosclerotic heart disease of native coronary artery without angina pectoris: Secondary | ICD-10-CM | POA: Diagnosis not present

## 2022-03-21 DIAGNOSIS — K219 Gastro-esophageal reflux disease without esophagitis: Secondary | ICD-10-CM | POA: Diagnosis not present

## 2022-03-21 DIAGNOSIS — E46 Unspecified protein-calorie malnutrition: Secondary | ICD-10-CM | POA: Diagnosis not present

## 2022-03-21 DIAGNOSIS — N400 Enlarged prostate without lower urinary tract symptoms: Secondary | ICD-10-CM | POA: Diagnosis not present

## 2022-03-21 DIAGNOSIS — E118 Type 2 diabetes mellitus with unspecified complications: Secondary | ICD-10-CM | POA: Diagnosis not present

## 2022-03-21 DIAGNOSIS — I5022 Chronic systolic (congestive) heart failure: Secondary | ICD-10-CM | POA: Diagnosis not present

## 2022-03-22 DIAGNOSIS — N400 Enlarged prostate without lower urinary tract symptoms: Secondary | ICD-10-CM | POA: Diagnosis not present

## 2022-03-22 DIAGNOSIS — I2510 Atherosclerotic heart disease of native coronary artery without angina pectoris: Secondary | ICD-10-CM | POA: Diagnosis not present

## 2022-03-22 DIAGNOSIS — E118 Type 2 diabetes mellitus with unspecified complications: Secondary | ICD-10-CM | POA: Diagnosis not present

## 2022-03-22 DIAGNOSIS — K219 Gastro-esophageal reflux disease without esophagitis: Secondary | ICD-10-CM | POA: Diagnosis not present

## 2022-03-22 DIAGNOSIS — E46 Unspecified protein-calorie malnutrition: Secondary | ICD-10-CM | POA: Diagnosis not present

## 2022-03-22 DIAGNOSIS — I5022 Chronic systolic (congestive) heart failure: Secondary | ICD-10-CM | POA: Diagnosis not present

## 2022-03-23 DIAGNOSIS — N400 Enlarged prostate without lower urinary tract symptoms: Secondary | ICD-10-CM | POA: Diagnosis not present

## 2022-03-23 DIAGNOSIS — E118 Type 2 diabetes mellitus with unspecified complications: Secondary | ICD-10-CM | POA: Diagnosis not present

## 2022-03-23 DIAGNOSIS — I2510 Atherosclerotic heart disease of native coronary artery without angina pectoris: Secondary | ICD-10-CM | POA: Diagnosis not present

## 2022-03-23 DIAGNOSIS — E46 Unspecified protein-calorie malnutrition: Secondary | ICD-10-CM | POA: Diagnosis not present

## 2022-03-23 DIAGNOSIS — I5022 Chronic systolic (congestive) heart failure: Secondary | ICD-10-CM | POA: Diagnosis not present

## 2022-03-23 DIAGNOSIS — K219 Gastro-esophageal reflux disease without esophagitis: Secondary | ICD-10-CM | POA: Diagnosis not present

## 2022-03-25 DIAGNOSIS — N400 Enlarged prostate without lower urinary tract symptoms: Secondary | ICD-10-CM | POA: Diagnosis not present

## 2022-03-25 DIAGNOSIS — E118 Type 2 diabetes mellitus with unspecified complications: Secondary | ICD-10-CM | POA: Diagnosis not present

## 2022-03-25 DIAGNOSIS — K219 Gastro-esophageal reflux disease without esophagitis: Secondary | ICD-10-CM | POA: Diagnosis not present

## 2022-03-25 DIAGNOSIS — I2510 Atherosclerotic heart disease of native coronary artery without angina pectoris: Secondary | ICD-10-CM | POA: Diagnosis not present

## 2022-03-25 DIAGNOSIS — E46 Unspecified protein-calorie malnutrition: Secondary | ICD-10-CM | POA: Diagnosis not present

## 2022-03-25 DIAGNOSIS — I5022 Chronic systolic (congestive) heart failure: Secondary | ICD-10-CM | POA: Diagnosis not present

## 2022-03-26 DIAGNOSIS — I5022 Chronic systolic (congestive) heart failure: Secondary | ICD-10-CM | POA: Diagnosis not present

## 2022-03-26 DIAGNOSIS — I2510 Atherosclerotic heart disease of native coronary artery without angina pectoris: Secondary | ICD-10-CM | POA: Diagnosis not present

## 2022-03-26 DIAGNOSIS — E118 Type 2 diabetes mellitus with unspecified complications: Secondary | ICD-10-CM | POA: Diagnosis not present

## 2022-03-26 DIAGNOSIS — E46 Unspecified protein-calorie malnutrition: Secondary | ICD-10-CM | POA: Diagnosis not present

## 2022-03-26 DIAGNOSIS — K219 Gastro-esophageal reflux disease without esophagitis: Secondary | ICD-10-CM | POA: Diagnosis not present

## 2022-03-26 DIAGNOSIS — N400 Enlarged prostate without lower urinary tract symptoms: Secondary | ICD-10-CM | POA: Diagnosis not present

## 2022-03-27 DIAGNOSIS — I2510 Atherosclerotic heart disease of native coronary artery without angina pectoris: Secondary | ICD-10-CM | POA: Diagnosis not present

## 2022-03-27 DIAGNOSIS — I509 Heart failure, unspecified: Secondary | ICD-10-CM | POA: Diagnosis not present

## 2022-03-27 DIAGNOSIS — E782 Mixed hyperlipidemia: Secondary | ICD-10-CM | POA: Diagnosis not present

## 2022-03-27 DIAGNOSIS — E119 Type 2 diabetes mellitus without complications: Secondary | ICD-10-CM | POA: Diagnosis not present

## 2022-03-27 DIAGNOSIS — E785 Hyperlipidemia, unspecified: Secondary | ICD-10-CM | POA: Diagnosis not present

## 2022-03-27 DIAGNOSIS — D518 Other vitamin B12 deficiency anemias: Secondary | ICD-10-CM | POA: Diagnosis not present

## 2022-03-27 DIAGNOSIS — E1169 Type 2 diabetes mellitus with other specified complication: Secondary | ICD-10-CM | POA: Diagnosis not present

## 2022-03-27 DIAGNOSIS — I10 Essential (primary) hypertension: Secondary | ICD-10-CM | POA: Diagnosis not present

## 2022-03-27 DIAGNOSIS — E038 Other specified hypothyroidism: Secondary | ICD-10-CM | POA: Diagnosis not present

## 2022-03-27 DIAGNOSIS — N400 Enlarged prostate without lower urinary tract symptoms: Secondary | ICD-10-CM | POA: Diagnosis not present

## 2022-03-28 DIAGNOSIS — Z515 Encounter for palliative care: Secondary | ICD-10-CM | POA: Diagnosis not present

## 2022-03-28 DIAGNOSIS — Z66 Do not resuscitate: Secondary | ICD-10-CM | POA: Diagnosis not present

## 2022-03-28 DIAGNOSIS — I10 Essential (primary) hypertension: Secondary | ICD-10-CM | POA: Diagnosis not present

## 2022-03-28 DIAGNOSIS — I5022 Chronic systolic (congestive) heart failure: Secondary | ICD-10-CM | POA: Diagnosis not present

## 2022-03-28 DIAGNOSIS — E785 Hyperlipidemia, unspecified: Secondary | ICD-10-CM | POA: Diagnosis not present

## 2022-03-28 DIAGNOSIS — R634 Abnormal weight loss: Secondary | ICD-10-CM | POA: Diagnosis not present

## 2022-03-28 DIAGNOSIS — N400 Enlarged prostate without lower urinary tract symptoms: Secondary | ICD-10-CM | POA: Diagnosis not present

## 2022-03-28 DIAGNOSIS — I2510 Atherosclerotic heart disease of native coronary artery without angina pectoris: Secondary | ICD-10-CM | POA: Diagnosis not present

## 2022-03-28 DIAGNOSIS — E46 Unspecified protein-calorie malnutrition: Secondary | ICD-10-CM | POA: Diagnosis not present

## 2022-03-28 DIAGNOSIS — K219 Gastro-esophageal reflux disease without esophagitis: Secondary | ICD-10-CM | POA: Diagnosis not present

## 2022-03-28 DIAGNOSIS — E118 Type 2 diabetes mellitus with unspecified complications: Secondary | ICD-10-CM | POA: Diagnosis not present

## 2022-03-28 DIAGNOSIS — I509 Heart failure, unspecified: Secondary | ICD-10-CM | POA: Diagnosis not present

## 2022-04-01 DIAGNOSIS — E46 Unspecified protein-calorie malnutrition: Secondary | ICD-10-CM | POA: Diagnosis not present

## 2022-04-01 DIAGNOSIS — E118 Type 2 diabetes mellitus with unspecified complications: Secondary | ICD-10-CM | POA: Diagnosis not present

## 2022-04-01 DIAGNOSIS — N400 Enlarged prostate without lower urinary tract symptoms: Secondary | ICD-10-CM | POA: Diagnosis not present

## 2022-04-01 DIAGNOSIS — I5022 Chronic systolic (congestive) heart failure: Secondary | ICD-10-CM | POA: Diagnosis not present

## 2022-04-01 DIAGNOSIS — I2510 Atherosclerotic heart disease of native coronary artery without angina pectoris: Secondary | ICD-10-CM | POA: Diagnosis not present

## 2022-04-01 DIAGNOSIS — K219 Gastro-esophageal reflux disease without esophagitis: Secondary | ICD-10-CM | POA: Diagnosis not present

## 2022-04-04 DIAGNOSIS — K219 Gastro-esophageal reflux disease without esophagitis: Secondary | ICD-10-CM | POA: Diagnosis not present

## 2022-04-04 DIAGNOSIS — L282 Other prurigo: Secondary | ICD-10-CM | POA: Diagnosis not present

## 2022-04-04 DIAGNOSIS — I2510 Atherosclerotic heart disease of native coronary artery without angina pectoris: Secondary | ICD-10-CM | POA: Diagnosis not present

## 2022-04-04 DIAGNOSIS — E46 Unspecified protein-calorie malnutrition: Secondary | ICD-10-CM | POA: Diagnosis not present

## 2022-04-04 DIAGNOSIS — I5022 Chronic systolic (congestive) heart failure: Secondary | ICD-10-CM | POA: Diagnosis not present

## 2022-04-04 DIAGNOSIS — L89220 Pressure ulcer of left hip, unstageable: Secondary | ICD-10-CM | POA: Diagnosis not present

## 2022-04-04 DIAGNOSIS — E118 Type 2 diabetes mellitus with unspecified complications: Secondary | ICD-10-CM | POA: Diagnosis not present

## 2022-04-04 DIAGNOSIS — N400 Enlarged prostate without lower urinary tract symptoms: Secondary | ICD-10-CM | POA: Diagnosis not present

## 2022-04-04 DIAGNOSIS — I509 Heart failure, unspecified: Secondary | ICD-10-CM | POA: Diagnosis not present

## 2022-04-05 DIAGNOSIS — E118 Type 2 diabetes mellitus with unspecified complications: Secondary | ICD-10-CM | POA: Diagnosis not present

## 2022-04-05 DIAGNOSIS — E46 Unspecified protein-calorie malnutrition: Secondary | ICD-10-CM | POA: Diagnosis not present

## 2022-04-05 DIAGNOSIS — K219 Gastro-esophageal reflux disease without esophagitis: Secondary | ICD-10-CM | POA: Diagnosis not present

## 2022-04-05 DIAGNOSIS — N400 Enlarged prostate without lower urinary tract symptoms: Secondary | ICD-10-CM | POA: Diagnosis not present

## 2022-04-05 DIAGNOSIS — I2510 Atherosclerotic heart disease of native coronary artery without angina pectoris: Secondary | ICD-10-CM | POA: Diagnosis not present

## 2022-04-05 DIAGNOSIS — I5022 Chronic systolic (congestive) heart failure: Secondary | ICD-10-CM | POA: Diagnosis not present

## 2022-04-06 DIAGNOSIS — N400 Enlarged prostate without lower urinary tract symptoms: Secondary | ICD-10-CM | POA: Diagnosis not present

## 2022-04-06 DIAGNOSIS — E118 Type 2 diabetes mellitus with unspecified complications: Secondary | ICD-10-CM | POA: Diagnosis not present

## 2022-04-06 DIAGNOSIS — E46 Unspecified protein-calorie malnutrition: Secondary | ICD-10-CM | POA: Diagnosis not present

## 2022-04-06 DIAGNOSIS — I5022 Chronic systolic (congestive) heart failure: Secondary | ICD-10-CM | POA: Diagnosis not present

## 2022-04-06 DIAGNOSIS — K219 Gastro-esophageal reflux disease without esophagitis: Secondary | ICD-10-CM | POA: Diagnosis not present

## 2022-04-06 DIAGNOSIS — I2510 Atherosclerotic heart disease of native coronary artery without angina pectoris: Secondary | ICD-10-CM | POA: Diagnosis not present

## 2022-04-07 DIAGNOSIS — I5022 Chronic systolic (congestive) heart failure: Secondary | ICD-10-CM | POA: Diagnosis not present

## 2022-04-07 DIAGNOSIS — N400 Enlarged prostate without lower urinary tract symptoms: Secondary | ICD-10-CM | POA: Diagnosis not present

## 2022-04-07 DIAGNOSIS — I2510 Atherosclerotic heart disease of native coronary artery without angina pectoris: Secondary | ICD-10-CM | POA: Diagnosis not present

## 2022-04-07 DIAGNOSIS — E118 Type 2 diabetes mellitus with unspecified complications: Secondary | ICD-10-CM | POA: Diagnosis not present

## 2022-04-07 DIAGNOSIS — E46 Unspecified protein-calorie malnutrition: Secondary | ICD-10-CM | POA: Diagnosis not present

## 2022-04-07 DIAGNOSIS — K219 Gastro-esophageal reflux disease without esophagitis: Secondary | ICD-10-CM | POA: Diagnosis not present

## 2022-04-08 DIAGNOSIS — K219 Gastro-esophageal reflux disease without esophagitis: Secondary | ICD-10-CM | POA: Diagnosis not present

## 2022-04-08 DIAGNOSIS — I5022 Chronic systolic (congestive) heart failure: Secondary | ICD-10-CM | POA: Diagnosis not present

## 2022-04-08 DIAGNOSIS — E46 Unspecified protein-calorie malnutrition: Secondary | ICD-10-CM | POA: Diagnosis not present

## 2022-04-08 DIAGNOSIS — N400 Enlarged prostate without lower urinary tract symptoms: Secondary | ICD-10-CM | POA: Diagnosis not present

## 2022-04-08 DIAGNOSIS — E118 Type 2 diabetes mellitus with unspecified complications: Secondary | ICD-10-CM | POA: Diagnosis not present

## 2022-04-08 DIAGNOSIS — I2510 Atherosclerotic heart disease of native coronary artery without angina pectoris: Secondary | ICD-10-CM | POA: Diagnosis not present

## 2022-04-10 DIAGNOSIS — K219 Gastro-esophageal reflux disease without esophagitis: Secondary | ICD-10-CM | POA: Diagnosis not present

## 2022-04-10 DIAGNOSIS — N400 Enlarged prostate without lower urinary tract symptoms: Secondary | ICD-10-CM | POA: Diagnosis not present

## 2022-04-10 DIAGNOSIS — E118 Type 2 diabetes mellitus with unspecified complications: Secondary | ICD-10-CM | POA: Diagnosis not present

## 2022-04-10 DIAGNOSIS — E46 Unspecified protein-calorie malnutrition: Secondary | ICD-10-CM | POA: Diagnosis not present

## 2022-04-10 DIAGNOSIS — I2510 Atherosclerotic heart disease of native coronary artery without angina pectoris: Secondary | ICD-10-CM | POA: Diagnosis not present

## 2022-04-10 DIAGNOSIS — I5022 Chronic systolic (congestive) heart failure: Secondary | ICD-10-CM | POA: Diagnosis not present

## 2022-04-11 DIAGNOSIS — E46 Unspecified protein-calorie malnutrition: Secondary | ICD-10-CM | POA: Diagnosis not present

## 2022-04-11 DIAGNOSIS — I5022 Chronic systolic (congestive) heart failure: Secondary | ICD-10-CM | POA: Diagnosis not present

## 2022-04-11 DIAGNOSIS — E118 Type 2 diabetes mellitus with unspecified complications: Secondary | ICD-10-CM | POA: Diagnosis not present

## 2022-04-11 DIAGNOSIS — K219 Gastro-esophageal reflux disease without esophagitis: Secondary | ICD-10-CM | POA: Diagnosis not present

## 2022-04-11 DIAGNOSIS — I2510 Atherosclerotic heart disease of native coronary artery without angina pectoris: Secondary | ICD-10-CM | POA: Diagnosis not present

## 2022-04-11 DIAGNOSIS — N400 Enlarged prostate without lower urinary tract symptoms: Secondary | ICD-10-CM | POA: Diagnosis not present

## 2022-04-16 DIAGNOSIS — I5022 Chronic systolic (congestive) heart failure: Secondary | ICD-10-CM | POA: Diagnosis not present

## 2022-04-16 DIAGNOSIS — K219 Gastro-esophageal reflux disease without esophagitis: Secondary | ICD-10-CM | POA: Diagnosis not present

## 2022-04-16 DIAGNOSIS — E46 Unspecified protein-calorie malnutrition: Secondary | ICD-10-CM | POA: Diagnosis not present

## 2022-04-16 DIAGNOSIS — E118 Type 2 diabetes mellitus with unspecified complications: Secondary | ICD-10-CM | POA: Diagnosis not present

## 2022-04-16 DIAGNOSIS — N400 Enlarged prostate without lower urinary tract symptoms: Secondary | ICD-10-CM | POA: Diagnosis not present

## 2022-04-16 DIAGNOSIS — I2510 Atherosclerotic heart disease of native coronary artery without angina pectoris: Secondary | ICD-10-CM | POA: Diagnosis not present

## 2022-04-26 DEATH — deceased
